# Patient Record
Sex: Female | Born: 1950 | Race: White | Hispanic: No | Marital: Married | Smoking: Former smoker
Health system: Southern US, Community
[De-identification: ages and names within clinical notes are randomized; demographics above are authoritative.]

## PROBLEM LIST (undated history)

## (undated) DIAGNOSIS — K529 Noninfective gastroenteritis and colitis, unspecified: Secondary | ICD-10-CM

## (undated) DIAGNOSIS — I499 Cardiac arrhythmia, unspecified: Secondary | ICD-10-CM

## (undated) DIAGNOSIS — N3281 Overactive bladder: Secondary | ICD-10-CM

## (undated) DIAGNOSIS — K219 Gastro-esophageal reflux disease without esophagitis: Secondary | ICD-10-CM

## (undated) DIAGNOSIS — Z85828 Personal history of other malignant neoplasm of skin: Secondary | ICD-10-CM

## (undated) DIAGNOSIS — Z973 Presence of spectacles and contact lenses: Secondary | ICD-10-CM

## (undated) DIAGNOSIS — C801 Malignant (primary) neoplasm, unspecified: Secondary | ICD-10-CM

## (undated) DIAGNOSIS — K589 Irritable bowel syndrome without diarrhea: Secondary | ICD-10-CM

## (undated) DIAGNOSIS — Z972 Presence of dental prosthetic device (complete) (partial): Secondary | ICD-10-CM

## (undated) DIAGNOSIS — M1990 Unspecified osteoarthritis, unspecified site: Secondary | ICD-10-CM

## (undated) DIAGNOSIS — E785 Hyperlipidemia, unspecified: Secondary | ICD-10-CM

## (undated) DIAGNOSIS — K642 Third degree hemorrhoids: Secondary | ICD-10-CM

## (undated) DIAGNOSIS — K449 Diaphragmatic hernia without obstruction or gangrene: Secondary | ICD-10-CM

## (undated) DIAGNOSIS — Z87898 Personal history of other specified conditions: Secondary | ICD-10-CM

## (undated) HISTORY — PX: MOHS SURGERY: SUR867

## (undated) HISTORY — PX: BUNIONECTOMY WITH HAMMERTOE RECONSTRUCTION: SHX5600

## (undated) HISTORY — DX: Hyperlipidemia, unspecified: E78.5

## (undated) HISTORY — PX: TONSILLECTOMY: SUR1361

## (undated) HISTORY — PX: FOOT SURGERY: SHX648

## (undated) HISTORY — DX: Diaphragmatic hernia without obstruction or gangrene: K44.9

## (undated) HISTORY — PX: CATARACT EXTRACTION, BILATERAL: SHX1313

## (undated) HISTORY — PX: TOTAL KNEE ARTHROPLASTY: SHX125

## (undated) HISTORY — PX: EYE SURGERY: SHX253

## (undated) HISTORY — PX: CARDIOVASCULAR STRESS TEST: SHX262

---

## 1983-01-29 HISTORY — PX: BREAST ENHANCEMENT SURGERY: SHX7

## 1994-01-28 HISTORY — PX: ABDOMINAL HYSTERECTOMY: SHX81

## 1999-05-17 ENCOUNTER — Encounter: Admission: RE | Admit: 1999-05-17 | Discharge: 1999-05-17 | Payer: Self-pay | Admitting: Family Medicine

## 1999-05-17 ENCOUNTER — Encounter: Payer: Self-pay | Admitting: Family Medicine

## 2000-12-12 ENCOUNTER — Encounter: Payer: Self-pay | Admitting: Family Medicine

## 2000-12-12 ENCOUNTER — Encounter: Admission: RE | Admit: 2000-12-12 | Discharge: 2000-12-12 | Payer: Self-pay | Admitting: Family Medicine

## 2001-11-13 ENCOUNTER — Ambulatory Visit (HOSPITAL_COMMUNITY): Admission: RE | Admit: 2001-11-13 | Discharge: 2001-11-13 | Payer: Self-pay | Admitting: Gastroenterology

## 2001-11-13 ENCOUNTER — Encounter: Payer: Self-pay | Admitting: Gastroenterology

## 2009-08-07 ENCOUNTER — Encounter: Admission: RE | Admit: 2009-08-07 | Discharge: 2009-08-07 | Payer: Self-pay | Admitting: Foot & Ankle Surgery

## 2009-12-28 HISTORY — PX: KNEE ARTHROSCOPY: SUR90

## 2010-02-18 ENCOUNTER — Encounter: Payer: Self-pay | Admitting: Foot & Ankle Surgery

## 2010-11-19 ENCOUNTER — Encounter (HOSPITAL_COMMUNITY)
Admission: RE | Admit: 2010-11-19 | Discharge: 2010-11-19 | Disposition: A | Discharge status: Home / Self Care | Payer: BC Managed Care – PPO | Source: Ambulatory Visit | Attending: Orthopedic Surgery | Admitting: Orthopedic Surgery

## 2010-11-19 ENCOUNTER — Other Ambulatory Visit (HOSPITAL_COMMUNITY): Payer: Self-pay | Admitting: Orthopedic Surgery

## 2010-11-19 ENCOUNTER — Ambulatory Visit (HOSPITAL_COMMUNITY)
Admission: RE | Admit: 2010-11-19 | Discharge: 2010-11-19 | Disposition: A | Discharge status: Home / Self Care | Payer: BC Managed Care – PPO | Source: Ambulatory Visit | Attending: Orthopedic Surgery | Admitting: Orthopedic Surgery

## 2010-11-19 DIAGNOSIS — Z01811 Encounter for preprocedural respiratory examination: Secondary | ICD-10-CM

## 2010-11-19 DIAGNOSIS — Z96659 Presence of unspecified artificial knee joint: Secondary | ICD-10-CM | POA: Insufficient documentation

## 2010-11-19 DIAGNOSIS — Z87891 Personal history of nicotine dependence: Secondary | ICD-10-CM | POA: Insufficient documentation

## 2010-11-19 DIAGNOSIS — Z01812 Encounter for preprocedural laboratory examination: Secondary | ICD-10-CM | POA: Insufficient documentation

## 2010-11-19 DIAGNOSIS — Z01818 Encounter for other preprocedural examination: Secondary | ICD-10-CM | POA: Insufficient documentation

## 2010-11-19 DIAGNOSIS — E669 Obesity, unspecified: Secondary | ICD-10-CM | POA: Insufficient documentation

## 2010-11-19 LAB — URINALYSIS, ROUTINE W REFLEX MICROSCOPIC
Leukocytes, UA: NEGATIVE
Nitrite: NEGATIVE
Specific Gravity, Urine: 1.021 (ref 1.005–1.030)
pH: 6.5 (ref 5.0–8.0)

## 2010-11-19 LAB — BASIC METABOLIC PANEL
Chloride: 102 mEq/L (ref 96–112)
GFR calc Af Amer: >90 mL/min (ref >90)
Potassium: 4.1 mEq/L (ref 3.5–5.1)
Sodium: 141 mEq/L (ref 135–145)

## 2010-11-19 LAB — CBC
HCT: 40.4 % (ref 36.0–46.0)
Hemoglobin: 13.9 g/dL (ref 12.0–15.0)
MCHC: 34.4 g/dL (ref 30.0–36.0)
WBC: 9.7 10*3/uL (ref 4.0–10.5)

## 2010-11-19 LAB — SURGICAL PCR SCREEN: MRSA, PCR: NEGATIVE

## 2010-11-19 LAB — DIFFERENTIAL
Basophils Absolute: 0 10*3/uL (ref 0.0–0.1)
Basophils Relative: 0 % (ref 0–1)
Eosinophils Absolute: 0.3 10*3/uL (ref 0.0–0.7)
Eosinophils Relative: 3 % (ref 0–5)
Lymphocytes Relative: 30 % (ref 12–46)
Lymphs Abs: 2.9 10*3/uL (ref 0.7–4.0)
Monocytes Absolute: 0.6 10*3/uL (ref 0.1–1.0)
Monocytes Relative: 6 % (ref 3–12)
Neutro Abs: 5.9 10*3/uL (ref 1.7–7.7)
Neutrophils Relative %: 61 % (ref 43–77)

## 2010-11-19 LAB — PROTIME-INR: INR: 0.99 (ref 0.00–1.49)

## 2010-11-20 LAB — URINE CULTURE

## 2010-11-27 ENCOUNTER — Inpatient Hospital Stay (HOSPITAL_COMMUNITY)
Admission: RE | Admit: 2010-11-27 | Discharge: 2010-11-30 | DRG: 209 | Disposition: A | Discharge status: Home Health | Payer: BC Managed Care – PPO | Source: Ambulatory Visit | Attending: Orthopedic Surgery | Admitting: Orthopedic Surgery

## 2010-11-27 ENCOUNTER — Inpatient Hospital Stay (HOSPITAL_COMMUNITY): Payer: BC Managed Care – PPO

## 2010-11-27 DIAGNOSIS — M1710 Unilateral primary osteoarthritis, unspecified knee: Principal | ICD-10-CM | POA: Diagnosis present

## 2010-11-27 DIAGNOSIS — K219 Gastro-esophageal reflux disease without esophagitis: Secondary | ICD-10-CM | POA: Diagnosis present

## 2010-11-27 LAB — TYPE AND SCREEN

## 2010-11-27 LAB — ABO/RH: ABO/RH(D): A POS

## 2010-11-28 LAB — BASIC METABOLIC PANEL
Chloride: 101 mEq/L (ref 96–112)
Creatinine, Ser: 0.62 mg/dL (ref 0.50–1.10)
GFR calc Af Amer: >90 mL/min (ref >90)
GFR calc non Af Amer: >90 mL/min (ref >90)

## 2010-11-28 LAB — CBC
Hemoglobin: 10.6 g/dL — ABNORMAL LOW (ref 12.0–15.0)
MCHC: 32.4 g/dL (ref 30.0–36.0)
Platelets: 274 10*3/uL (ref 150–400)
RBC: 3.61 MIL/uL — ABNORMAL LOW (ref 3.87–5.11)

## 2010-11-28 LAB — PROTIME-INR
INR: 1.15 (ref 0.00–1.49)
Prothrombin Time: 14.9 seconds (ref 11.6–15.2)

## 2010-11-29 LAB — BASIC METABOLIC PANEL
CO2: 31 mEq/L (ref 19–32)
Calcium: 9 mg/dL (ref 8.4–10.5)
GFR calc Af Amer: >90 mL/min (ref >90)
GFR calc non Af Amer: >90 mL/min (ref >90)
Sodium: 138 mEq/L (ref 135–145)

## 2010-11-29 LAB — PROTIME-INR
INR: 1.67 — ABNORMAL HIGH (ref 0.00–1.49)
Prothrombin Time: 20 seconds — ABNORMAL HIGH (ref 11.6–15.2)

## 2010-11-29 LAB — CBC
HCT: 32.7 % — ABNORMAL LOW (ref 36.0–46.0)
Platelets: 237 10*3/uL (ref 150–400)
RDW: 13 % (ref 11.5–15.5)
WBC: 7.9 10*3/uL (ref 4.0–10.5)

## 2010-11-30 LAB — PROTIME-INR
INR: 1.84 — ABNORMAL HIGH (ref 0.00–1.49)
Prothrombin Time: 21.6 seconds — ABNORMAL HIGH (ref 11.6–15.2)

## 2010-11-30 LAB — CBC
Platelets: 258 10*3/uL (ref 150–400)
RBC: 3.69 MIL/uL — ABNORMAL LOW (ref 3.87–5.11)
RDW: 12.9 % (ref 11.5–15.5)
WBC: 7.5 10*3/uL (ref 4.0–10.5)

## 2011-01-09 ENCOUNTER — Other Ambulatory Visit (HOSPITAL_COMMUNITY): Payer: Self-pay | Admitting: Orthopedic Surgery

## 2011-01-10 ENCOUNTER — Encounter (HOSPITAL_COMMUNITY): Payer: Self-pay

## 2011-01-11 ENCOUNTER — Encounter (HOSPITAL_COMMUNITY): Payer: Self-pay

## 2011-01-11 ENCOUNTER — Encounter (HOSPITAL_COMMUNITY)
Admission: RE | Admit: 2011-01-11 | Discharge: 2011-01-11 | Disposition: A | Discharge status: Home / Self Care | Payer: BC Managed Care – PPO | Source: Ambulatory Visit | Attending: Orthopedic Surgery | Admitting: Orthopedic Surgery

## 2011-01-11 HISTORY — DX: Gastro-esophageal reflux disease without esophagitis: K21.9

## 2011-01-11 HISTORY — DX: Malignant (primary) neoplasm, unspecified: C80.1

## 2011-01-11 LAB — BASIC METABOLIC PANEL
CO2: 27 mEq/L (ref 19–32)
Chloride: 102 mEq/L (ref 96–112)
GFR calc Af Amer: >90 mL/min (ref >90)
Potassium: 4.4 mEq/L (ref 3.5–5.1)
Sodium: 140 mEq/L (ref 135–145)

## 2011-01-11 LAB — CBC
HCT: 39.7 % (ref 36.0–46.0)
Hemoglobin: 13.1 g/dL (ref 12.0–15.0)
MCV: 85.2 fL (ref 78.0–100.0)
RBC: 4.66 MIL/uL (ref 3.87–5.11)
RDW: 12.4 % (ref 11.5–15.5)
WBC: 6.6 10*3/uL (ref 4.0–10.5)

## 2011-01-11 LAB — URINALYSIS, ROUTINE W REFLEX MICROSCOPIC
Bilirubin Urine: NEGATIVE
Leukocytes, UA: NEGATIVE
Nitrite: NEGATIVE
Specific Gravity, Urine: 1.023 (ref 1.005–1.030)
Urobilinogen, UA: 0.2 mg/dL (ref 0.0–1.0)

## 2011-01-11 LAB — TYPE AND SCREEN: Antibody Screen: NEGATIVE

## 2011-01-11 LAB — SURGICAL PCR SCREEN: MRSA, PCR: NEGATIVE

## 2011-01-11 MED ORDER — CHLORHEXIDINE GLUCONATE 4 % EX LIQD: 60.0000 mL | Freq: Once | CUTANEOUS | Status: DC

## 2011-01-11 NOTE — Pre-Procedure Instructions (Signed)
[place]  01/11/2011   Your procedure is scheduled on:  01-15-11 Tuesday  Report to [facility] at 11:45 AM.  Call this number if you have problems the morning of surgery: [phone]   Remember:   Do not eat food:After Midnight.  May have clear liquids: up to 4 Hours before arrival.  Clear liquids include soda, tea, black coffee, apple or grape juice, broth.  Take these medicines the morning of surgery with A SIP OF WATER: Ultram if needed   Do not wear jewelry, make-up or nail polish.  Do not wear lotions, powders, or perfumes. You may wear deodorant.  Do not shave 48 hours prior to surgery.  Do not bring valuables to the hospital.  Contacts, dentures or bridgework may not be worn into surgery.  Leave suitcase in the car. After surgery it may be brought to your room.  For patients admitted to the hospital, checkout time is 11:00 AM the day of discharge.   Patients discharged the day of surgery will not be allowed to drive home.  Name and phone number of your driver: Joann [name] - husband [phone]  Special Instructions: CHG Shower Use Special Wash: 1/2 bottle night before surgery and 1/2 bottle morning of surgery.   Please read over the following fact sheets that you were given: Pain Booklet, Coughing and Deep Breathing, Blood Transfusion Information, MRSA Information and Surgical Site Infection Prevention

## 2011-01-12 LAB — URINE CULTURE

## 2011-01-13 NOTE — H&P (Signed)
NAMEAQUA, DENSLOW                ACCOUNT NO.:  [id number]  MEDICAL RECORD NO.:  [id number]  LOCATION:  SDS                          FACILITY:  [facility]  PHYSICIAN:  Anderson Malta, M.D.    DATE OF BIRTH:  May 06, 1950  DATE OF ADMISSION:  01/11/2011 DATE OF DISCHARGE:  01/11/2011                             HISTORY & PHYSICAL   CHIEF COMPLAINT:  Right knee pain.  HISTORY OF PRESENT ILLNESS:  Joann [name] is a 60 year old female who is about 7-1/2 weeks out from left total knee replacement.  She has done well with that left total knee and found to have currently no pain in the left knee.  She describes pain in the right knee, pain with activities of daily living, ambulation and rest pain and night pain. This pain keeps her from doing what she wants to do.  She has failed a course of nonoperative therapy for the right knee.  PAST MEDICAL HISTORY:  Notable for: 1. Gastroesophageal reflux disease. 2. Skin cancer on the nose. 3. 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  SOCIAL HISTORY:  She is a former smoker.  Occasional alcohol use.  REVIEW OF SYSTEMS:  All systems reviewed and are negative as they relate to the right knee.  CURRENT MEDICATIONS:  Include Tylenol, calcium citrate, Robaxin, multivitamins, tramadol and Coumadin, which was stopped as of January 04, 2011.  PHYSICAL EXAMINATION:  GENERAL:  She is well developed, well nourished, in no acute distress, alert and oriented.  Normal body mass index. EXTREMITIES:  Pretty normal gait and alignment, but does have slight varus alignment in right lower extremity.  Palpable pedal pulses.  No pitting edema in the right lower extremity.  Range of motion is about 5- 110 degrees.  Collateral and cruciate ligaments are stable.  Trace effusion is present.  No groin pain with internal or external rotation of the leg.  No other masses, lymphadenopathy, or skin changes noted in the right knee region.  Radiographs do show end-stage  arthritis, all 3 compartments, worse on the medial side.  IMPRESSION:  Right knee arthritis refractory to nonoperative management with pain with activities of daily living.  The patient desires total knee replacement on the right.  She has had a good result with the knee replacement on the left.  Risks and benefits are discussed to include but not limited to infection, nerve or vessel damage, knee stiffness.  They are essentially the same once it applied to her left knee 7 weeks ago.  The patient understands the risks and benefits and does wish to proceed with total knee replacement.  All questions were answered.     Anderson Malta, M.D.     GSD/MEDQ  D:  01/13/2011  T:  01/13/2011  Job:  [id number]

## 2011-01-13 NOTE — H&P (Signed)
  [id number] 

## 2011-01-14 MED ORDER — CEFAZOLIN SODIUM-DEXTROSE 2-3 GM-% IV SOLR
2.0000 g | INTRAVENOUS | Status: AC
  Administered 2011-01-15: 2 g via INTRAVENOUS
  Filled 2011-01-14: qty 50

## 2011-01-14 MED ORDER — CHLORHEXIDINE GLUCONATE 4 % EX LIQD: 60.0000 mL | Freq: Once | CUTANEOUS | Status: DC

## 2011-01-15 ENCOUNTER — Ambulatory Visit (HOSPITAL_COMMUNITY): Payer: BC Managed Care – PPO | Admitting: Certified Registered"

## 2011-01-15 ENCOUNTER — Encounter (HOSPITAL_COMMUNITY): Payer: Self-pay | Admitting: Certified Registered"

## 2011-01-15 ENCOUNTER — Encounter (HOSPITAL_COMMUNITY): Payer: Self-pay | Admitting: *Deleted

## 2011-01-15 ENCOUNTER — Encounter (HOSPITAL_COMMUNITY)
Admission: AD | Disposition: A | Discharge status: Home / Self Care | Payer: Self-pay | Source: Ambulatory Visit | Attending: Orthopedic Surgery

## 2011-01-15 ENCOUNTER — Inpatient Hospital Stay (HOSPITAL_COMMUNITY)
Admission: AD | Admit: 2011-01-15 | Discharge: 2011-01-18 | DRG: 209 | Disposition: A | Discharge status: Home / Self Care | Payer: BC Managed Care – PPO | Source: Ambulatory Visit | Attending: Orthopedic Surgery | Admitting: Orthopedic Surgery

## 2011-01-15 ENCOUNTER — Ambulatory Visit (HOSPITAL_COMMUNITY): Payer: BC Managed Care – PPO

## 2011-01-15 DIAGNOSIS — M1712 Unilateral primary osteoarthritis, left knee: Secondary | ICD-10-CM

## 2011-01-15 DIAGNOSIS — K219 Gastro-esophageal reflux disease without esophagitis: Secondary | ICD-10-CM | POA: Diagnosis present

## 2011-01-15 DIAGNOSIS — M479 Spondylosis, unspecified: Secondary | ICD-10-CM | POA: Diagnosis present

## 2011-01-15 DIAGNOSIS — M1710 Unilateral primary osteoarthritis, unspecified knee: Principal | ICD-10-CM | POA: Diagnosis present

## 2011-01-15 DIAGNOSIS — Z85828 Personal history of other malignant neoplasm of skin: Secondary | ICD-10-CM

## 2011-01-15 HISTORY — PX: KNEE ARTHROPLASTY: SHX992

## 2011-01-15 LAB — APTT: aPTT: 28 seconds (ref 24–37)

## 2011-01-15 LAB — PROTIME-INR
INR: 1.13 (ref 0.00–1.49)
Prothrombin Time: 14.7 seconds (ref 11.6–15.2)

## 2011-01-15 SURGERY — ARTHROPLASTY, KNEE, TOTAL, USING IMAGELESS COMPUTER-ASSISTED NAVIGATION
Anesthesia: General | Site: Knee | Laterality: Right | Wound class: Clean

## 2011-01-15 MED ORDER — MORPHINE SULFATE 10 MG/ML IJ SOLN
INTRAMUSCULAR | Status: DC | PRN
  Administered 2011-01-15 (×2): 5 mg via INTRAVENOUS

## 2011-01-15 MED ORDER — NALOXONE HCL 0.4 MG/ML IJ SOLN
0.4000 mg | INTRAMUSCULAR | Status: DC | PRN
  Filled 2011-01-15: qty 1

## 2011-01-15 MED ORDER — KCL IN DEXTROSE-NACL 20-5-0.45 MEQ/L-%-% IV SOLN
INTRAVENOUS | Status: DC
  Administered 2011-01-16 – 2011-01-17 (×3): via INTRAVENOUS
  Filled 2011-01-15 (×7): qty 1000

## 2011-01-15 MED ORDER — ACETAMINOPHEN 325 MG PO TABS
650.0000 mg | ORAL_TABLET | Freq: Four times a day (QID) | ORAL | Status: DC | PRN
  Administered 2011-01-16 – 2011-01-17 (×2): 650 mg via ORAL
  Filled 2011-01-15 (×2): qty 2

## 2011-01-15 MED ORDER — SODIUM CHLORIDE 0.9 % IJ SOLN: 9.0000 mL | INTRAMUSCULAR | Status: DC | PRN

## 2011-01-15 MED ORDER — NEOSTIGMINE METHYLSULFATE 1 MG/ML IJ SOLN
INTRAMUSCULAR | Status: DC | PRN
  Administered 2011-01-15: 3.5 mg via INTRAVENOUS

## 2011-01-15 MED ORDER — BUPIVACAINE HCL (PF) 0.25 % IJ SOLN
INTRAMUSCULAR | Status: DC | PRN
  Administered 2011-01-15: 20 mL via INTRA_ARTICULAR

## 2011-01-15 MED ORDER — ONDANSETRON HCL 4 MG/2ML IJ SOLN
4.0000 mg | Freq: Four times a day (QID) | INTRAMUSCULAR | Status: DC | PRN
  Administered 2011-01-16 – 2011-01-18 (×3): 4 mg via INTRAVENOUS
  Filled 2011-01-15 (×3): qty 2

## 2011-01-15 MED ORDER — DIPHENHYDRAMINE HCL 12.5 MG/5ML PO ELIX
12.5000 mg | ORAL_SOLUTION | Freq: Four times a day (QID) | ORAL | Status: DC | PRN
  Filled 2011-01-15: qty 5

## 2011-01-15 MED ORDER — FLEET ENEMA 7-19 GM/118ML RE ENEM: 1.0000 | ENEMA | Freq: Once | RECTAL | Status: AC | PRN

## 2011-01-15 MED ORDER — HYDROMORPHONE HCL PF 1 MG/ML IJ SOLN
0.5000 mg | INTRAMUSCULAR | Status: DC | PRN
  Administered 2011-01-15: 0.5 mg via INTRAVENOUS

## 2011-01-15 MED ORDER — PHENOL 1.4 % MT LIQD
1.0000 | OROMUCOSAL | Status: DC | PRN
  Filled 2011-01-15: qty 177

## 2011-01-15 MED ORDER — ROCURONIUM BROMIDE 100 MG/10ML IV SOLN
INTRAVENOUS | Status: DC | PRN
  Administered 2011-01-15: 50 mg via INTRAVENOUS

## 2011-01-15 MED ORDER — ONDANSETRON HCL 4 MG/2ML IJ SOLN
4.0000 mg | Freq: Four times a day (QID) | INTRAMUSCULAR | Status: DC | PRN
  Filled 2011-01-15: qty 2

## 2011-01-15 MED ORDER — HYDROMORPHONE HCL PF 1 MG/ML IJ SOLN
0.2500 mg | INTRAMUSCULAR | Status: DC | PRN
  Administered 2011-01-15 (×4): 0.5 mg via INTRAVENOUS

## 2011-01-15 MED ORDER — METOCLOPRAMIDE HCL 5 MG PO TABS
5.0000 mg | ORAL_TABLET | Freq: Three times a day (TID) | ORAL | Status: DC | PRN
  Filled 2011-01-15: qty 2

## 2011-01-15 MED ORDER — PROPOFOL 10 MG/ML IV EMUL
INTRAVENOUS | Status: DC | PRN
  Administered 2011-01-15: 200 mg via INTRAVENOUS
  Administered 2011-01-15: 50 mg via INTRAVENOUS

## 2011-01-15 MED ORDER — FENTANYL CITRATE 0.05 MG/ML IJ SOLN
INTRAMUSCULAR | Status: DC | PRN
  Administered 2011-01-15: 100 ug via INTRAVENOUS
  Administered 2011-01-15: 50 ug via INTRAVENOUS
  Administered 2011-01-15: 250 ug via INTRAVENOUS
  Administered 2011-01-15: 100 ug via INTRAVENOUS

## 2011-01-15 MED ORDER — CEFAZOLIN SODIUM 1-5 GM-% IV SOLN
1.0000 g | Freq: Four times a day (QID) | INTRAVENOUS | Status: AC
  Administered 2011-01-16 (×3): 1 g via INTRAVENOUS
  Filled 2011-01-15 (×5): qty 50

## 2011-01-15 MED ORDER — SENNOSIDES-DOCUSATE SODIUM 8.6-50 MG PO TABS: 1.0000 | ORAL_TABLET | Freq: Every evening | ORAL | Status: DC | PRN

## 2011-01-15 MED ORDER — ONDANSETRON HCL 4 MG PO TABS: 4.0000 mg | ORAL_TABLET | Freq: Four times a day (QID) | ORAL | Status: DC | PRN

## 2011-01-15 MED ORDER — GLYCOPYRROLATE 0.2 MG/ML IJ SOLN
INTRAMUSCULAR | Status: DC | PRN
  Administered 2011-01-15: .5 mg via INTRAVENOUS

## 2011-01-15 MED ORDER — THERA M PLUS PO TABS
1.0000 | ORAL_TABLET | Freq: Every day | ORAL | Status: DC
  Administered 2011-01-15 – 2011-01-18 (×4): 1 via ORAL
  Filled 2011-01-15 (×4): qty 1

## 2011-01-15 MED ORDER — MIDAZOLAM HCL 5 MG/5ML IJ SOLN
INTRAMUSCULAR | Status: DC | PRN
  Administered 2011-01-15: 2 mg via INTRAVENOUS

## 2011-01-15 MED ORDER — ACETAMINOPHEN 10 MG/ML IV SOLN
INTRAVENOUS | Status: AC
  Filled 2011-01-15: qty 100

## 2011-01-15 MED ORDER — METHOCARBAMOL 100 MG/ML IJ SOLN
500.0000 mg | INTRAVENOUS | Status: AC
  Administered 2011-01-15: 500 mg via INTRAVENOUS
  Filled 2011-01-15: qty 5

## 2011-01-15 MED ORDER — MORPHINE SULFATE (PF) 1 MG/ML IV SOLN
INTRAVENOUS | Status: DC
  Administered 2011-01-15: 6 mg via INTRAVENOUS
  Administered 2011-01-15: 20:00:00 via INTRAVENOUS
  Administered 2011-01-15: 6 mg via INTRAVENOUS
  Administered 2011-01-16 (×2): via INTRAVENOUS
  Administered 2011-01-16: 27.88 mg via INTRAVENOUS
  Administered 2011-01-16: 13.5 mg via INTRAVENOUS
  Administered 2011-01-16 – 2011-01-17 (×2): via INTRAVENOUS
  Administered 2011-01-17: 21 mg via INTRAVENOUS
  Filled 2011-01-15 (×5): qty 25

## 2011-01-15 MED ORDER — BISACODYL 10 MG RE SUPP: 10.0000 mg | Freq: Every day | RECTAL | Status: DC | PRN

## 2011-01-15 MED ORDER — WARFARIN SODIUM 6 MG PO TABS
6.0000 mg | ORAL_TABLET | ORAL | Status: DC
  Filled 2011-01-15: qty 1

## 2011-01-15 MED ORDER — MEPERIDINE HCL 25 MG/ML IJ SOLN: 6.2500 mg | INTRAMUSCULAR | Status: DC | PRN

## 2011-01-15 MED ORDER — 0.9 % SODIUM CHLORIDE (POUR BTL) OPTIME
TOPICAL | Status: DC | PRN
  Administered 2011-01-15: 1000 mL

## 2011-01-15 MED ORDER — WARFARIN SODIUM 6 MG PO TABS
6.0000 mg | ORAL_TABLET | Freq: Once | ORAL | Status: AC
  Administered 2011-01-15: 6 mg via ORAL
  Filled 2011-01-15: qty 1

## 2011-01-15 MED ORDER — OXYCODONE-ACETAMINOPHEN 5-325 MG PO TABS
1.0000 | ORAL_TABLET | ORAL | Status: DC | PRN
  Administered 2011-01-17 (×2): 2 via ORAL
  Administered 2011-01-17: 1 via ORAL
  Administered 2011-01-17 – 2011-01-18 (×2): 2 via ORAL
  Administered 2011-01-18: 1 via ORAL
  Filled 2011-01-15 (×5): qty 2

## 2011-01-15 MED ORDER — MORPHINE SULFATE 4 MG/ML IJ SOLN
INTRAMUSCULAR | Status: DC | PRN
  Administered 2011-01-15: 8 mg via INTRAVENOUS

## 2011-01-15 MED ORDER — MENTHOL 3 MG MT LOZG: 1.0000 | LOZENGE | OROMUCOSAL | Status: DC | PRN

## 2011-01-15 MED ORDER — DIPHENHYDRAMINE HCL 50 MG/ML IJ SOLN
12.5000 mg | Freq: Four times a day (QID) | INTRAMUSCULAR | Status: DC | PRN
  Filled 2011-01-15: qty 0.25

## 2011-01-15 MED ORDER — SODIUM CHLORIDE 0.9 % IR SOLN
Status: DC | PRN
  Administered 2011-01-15: 3000 mL

## 2011-01-15 MED ORDER — ACETAMINOPHEN 650 MG RE SUPP: 650.0000 mg | Freq: Four times a day (QID) | RECTAL | Status: DC | PRN

## 2011-01-15 MED ORDER — PROMETHAZINE HCL 25 MG/ML IJ SOLN: 6.2500 mg | INTRAMUSCULAR | Status: DC | PRN

## 2011-01-15 MED ORDER — METHOCARBAMOL 100 MG/ML IJ SOLN
500.0000 mg | Freq: Four times a day (QID) | INTRAVENOUS | Status: DC | PRN
  Administered 2011-01-15: 500 mg via INTRAVENOUS
  Filled 2011-01-15: qty 5

## 2011-01-15 MED ORDER — DIPHENHYDRAMINE HCL 12.5 MG/5ML PO ELIX
12.5000 mg | ORAL_SOLUTION | ORAL | Status: DC | PRN
  Filled 2011-01-15: qty 10

## 2011-01-15 MED ORDER — HYDROCODONE-ACETAMINOPHEN 5-325 MG PO TABS
1.0000 | ORAL_TABLET | ORAL | Status: DC | PRN
  Administered 2011-01-16: 2 via ORAL
  Filled 2011-01-15: qty 2

## 2011-01-15 MED ORDER — ZOLPIDEM TARTRATE 5 MG PO TABS: 5.0000 mg | ORAL_TABLET | Freq: Every evening | ORAL | Status: DC | PRN

## 2011-01-15 MED ORDER — OXYCODONE-ACETAMINOPHEN 5-325 MG PO TABS
2.0000 | ORAL_TABLET | ORAL | Status: DC | PRN
  Filled 2011-01-15: qty 2

## 2011-01-15 MED ORDER — METOCLOPRAMIDE HCL 5 MG/ML IJ SOLN
5.0000 mg | Freq: Three times a day (TID) | INTRAMUSCULAR | Status: DC | PRN
  Filled 2011-01-15: qty 2

## 2011-01-15 MED ORDER — CLONIDINE HCL (ANALGESIA) 100 MCG/ML EP SOLN: EPIDURAL | Status: DC | PRN

## 2011-01-15 MED ORDER — LACTATED RINGERS IV SOLN
INTRAVENOUS | Status: DC | PRN
  Administered 2011-01-15: 16:00:00 via INTRAVENOUS

## 2011-01-15 MED ORDER — ONDANSETRON HCL 4 MG/2ML IJ SOLN
INTRAMUSCULAR | Status: DC | PRN
  Administered 2011-01-15: 4 mg via INTRAVENOUS

## 2011-01-15 MED ORDER — METHOCARBAMOL 500 MG PO TABS
500.0000 mg | ORAL_TABLET | Freq: Four times a day (QID) | ORAL | Status: DC | PRN
  Administered 2011-01-16 – 2011-01-18 (×6): 500 mg via ORAL
  Filled 2011-01-15 (×6): qty 1

## 2011-01-15 MED ORDER — LACTATED RINGERS IV SOLN
INTRAVENOUS | Status: DC
  Administered 2011-01-15: 16:00:00 via INTRAVENOUS

## 2011-01-15 MED ORDER — DOCUSATE SODIUM 100 MG PO CAPS
100.0000 mg | ORAL_CAPSULE | Freq: Two times a day (BID) | ORAL | Status: DC
  Administered 2011-01-16 – 2011-01-18 (×6): 100 mg via ORAL
  Filled 2011-01-15 (×8): qty 1

## 2011-01-15 MED ORDER — ACETAMINOPHEN 10 MG/ML IV SOLN
INTRAVENOUS | Status: DC | PRN
  Administered 2011-01-15: 1000 mg via INTRAVENOUS

## 2011-01-15 SURGICAL SUPPLY — 78 items
BANDAGE ELASTIC 4 VELCRO ST LF (GAUZE/BANDAGES/DRESSINGS) ×2 IMPLANT
BANDAGE ELASTIC 6 VELCRO ST LF (GAUZE/BANDAGES/DRESSINGS) ×2 IMPLANT
BANDAGE ESMARK 6X9 LF (GAUZE/BANDAGES/DRESSINGS) ×1 IMPLANT
BLADE SAG 18X100X1.27 (BLADE) ×2 IMPLANT
BLADE SAW SGTL 13.0X1.19X90.0M (BLADE) ×2 IMPLANT
BNDG COHESIVE 6X5 TAN STRL LF (GAUZE/BANDAGES/DRESSINGS) ×2 IMPLANT
BNDG ELASTIC 6X10 VLCR STRL LF (GAUZE/BANDAGES/DRESSINGS) ×2 IMPLANT
BNDG ESMARK 6X9 LF (GAUZE/BANDAGES/DRESSINGS) ×2
BOWL SMART MIX CTS (DISPOSABLE) ×2 IMPLANT
CEMENT HV SMART SET (Cement) ×4 IMPLANT
CLOTH BEACON ORANGE TIMEOUT ST (SAFETY) ×2 IMPLANT
COVER BACK TABLE 24X17X13 BIG (DRAPES) IMPLANT
COVER SURGICAL LIGHT HANDLE (MISCELLANEOUS) ×2 IMPLANT
CUFF TOURNIQUET SINGLE 34IN LL (TOURNIQUET CUFF) IMPLANT
CUFF TOURNIQUET SINGLE 44IN (TOURNIQUET CUFF) IMPLANT
DRAPE INCISE IOBAN 66X45 STRL (DRAPES) IMPLANT
DRAPE ORTHO SPLIT 77X108 STRL (DRAPES) ×3
DRAPE PROXIMA HALF (DRAPES) ×2 IMPLANT
DRAPE SURG ORHT 6 SPLT 77X108 (DRAPES) ×3 IMPLANT
DRAPE U-SHAPE 47X51 STRL (DRAPES) ×2 IMPLANT
DRAPE X-RAY CASS 24X20 (DRAPES) IMPLANT
DRSG PAD ABDOMINAL 8X10 ST (GAUZE/BANDAGES/DRESSINGS) ×6 IMPLANT
DURAPREP 26ML APPLICATOR (WOUND CARE) ×2 IMPLANT
ELECT REM PT RETURN 9FT ADLT (ELECTROSURGICAL) ×2
ELECTRODE REM PT RTRN 9FT ADLT (ELECTROSURGICAL) ×1 IMPLANT
EVACUATOR 1/8 PVC DRAIN (DRAIN) ×2 IMPLANT
FACESHIELD LNG OPTICON STERILE (SAFETY) ×2 IMPLANT
FLUID NSS /IRRIG 3000 ML XXX (IV SOLUTION) ×2 IMPLANT
GAUZE XEROFORM 5X9 LF (GAUZE/BANDAGES/DRESSINGS) ×2 IMPLANT
GLOVE BIO SURGEON ST LM GN SZ9 (GLOVE) ×2 IMPLANT
GLOVE BIOGEL PI IND STRL 7.5 (GLOVE) ×1 IMPLANT
GLOVE BIOGEL PI IND STRL 8 (GLOVE) ×1 IMPLANT
GLOVE BIOGEL PI INDICATOR 7.5 (GLOVE) ×1
GLOVE BIOGEL PI INDICATOR 8 (GLOVE) ×1
GLOVE ECLIPSE 7.0 STRL STRAW (GLOVE) ×2 IMPLANT
GLOVE SURG ORTHO 8.0 STRL STRW (GLOVE) ×2 IMPLANT
GOWN PREVENTION PLUS LG XLONG (DISPOSABLE) IMPLANT
GOWN PREVENTION PLUS XLARGE (GOWN DISPOSABLE) ×2 IMPLANT
GOWN STRL NON-REIN LRG LVL3 (GOWN DISPOSABLE) ×6 IMPLANT
HANDPIECE INTERPULSE COAX TIP (DISPOSABLE) ×1
HOOD PEEL AWAY FACE SHEILD DIS (HOOD) ×8 IMPLANT
IMMOBILIZER KNEE 20 (SOFTGOODS) ×2
IMMOBILIZER KNEE 20 THIGH 36 (SOFTGOODS) ×1 IMPLANT
IMMOBILIZER KNEE 22 UNIV (SOFTGOODS) IMPLANT
IMMOBILIZER KNEE 24 THIGH 36 (MISCELLANEOUS) IMPLANT
IMMOBILIZER KNEE 24 UNIV (MISCELLANEOUS)
KIT BASIN OR (CUSTOM PROCEDURE TRAY) ×2 IMPLANT
KIT ROOM TURNOVER OR (KITS) ×2 IMPLANT
MANIFOLD NEPTUNE II (INSTRUMENTS) ×2 IMPLANT
MARKER SPHERE PSV REFLC THRD 5 (MARKER) ×6 IMPLANT
NEEDLE 18GX1X1/2 (RX/OR ONLY) (NEEDLE) ×4 IMPLANT
NEEDLE SPNL 18GX3.5 QUINCKE PK (NEEDLE) ×2 IMPLANT
NS IRRIG 1000ML POUR BTL (IV SOLUTION) ×2 IMPLANT
PACK TOTAL JOINT (CUSTOM PROCEDURE TRAY) ×2 IMPLANT
PAD ARMBOARD 7.5X6 YLW CONV (MISCELLANEOUS) ×4 IMPLANT
PAD CAST 4YDX4 CTTN HI CHSV (CAST SUPPLIES) ×1 IMPLANT
PADDING CAST COTTON 4X4 STRL (CAST SUPPLIES) ×1
PADDING CAST COTTON 6X4 STRL (CAST SUPPLIES) ×2 IMPLANT
PIN SCHANZ 4MM 130MM (PIN) ×8 IMPLANT
RUBBERBAND STERILE (MISCELLANEOUS) ×2 IMPLANT
SET HNDPC FAN SPRY TIP SCT (DISPOSABLE) ×1 IMPLANT
SPONGE GAUZE 4X4 12PLY (GAUZE/BANDAGES/DRESSINGS) ×2 IMPLANT
SPONGE GAUZE 4X4 STERILE 39 (GAUZE/BANDAGES/DRESSINGS) ×2 IMPLANT
SPONGE LAP 18X18 X RAY DECT (DISPOSABLE) IMPLANT
STAPLER VISISTAT 35W (STAPLE) ×2 IMPLANT
SUCTION FRAZIER TIP 10 FR DISP (SUCTIONS) ×2 IMPLANT
SUT ETHILON 3 0 PS 1 (SUTURE) ×4 IMPLANT
SUT VIC AB 0 CTB1 27 (SUTURE) ×6 IMPLANT
SUT VIC AB 1 CT1 27 (SUTURE) ×5
SUT VIC AB 1 CT1 27XBRD ANBCTR (SUTURE) ×5 IMPLANT
SUT VIC AB 2-0 CT1 27 (SUTURE) ×3
SUT VIC AB 2-0 CT1 TAPERPNT 27 (SUTURE) ×3 IMPLANT
SYR 30ML SLIP (SYRINGE) ×2 IMPLANT
SYR TB 1ML LUER SLIP (SYRINGE) ×2 IMPLANT
TOWEL OR 17X24 6PK STRL BLUE (TOWEL DISPOSABLE) ×2 IMPLANT
TOWEL OR 17X26 10 PK STRL BLUE (TOWEL DISPOSABLE) ×8 IMPLANT
TRAY FOLEY CATH 14FR (SET/KITS/TRAYS/PACK) ×2 IMPLANT
WATER STERILE IRR 1000ML POUR (IV SOLUTION) ×6 IMPLANT

## 2011-01-15 NOTE — Anesthesia Preprocedure Evaluation (Addendum)
Anesthesia Evaluation  Patient identified by MRN, date of birth, ID band Patient awake    Reviewed: Allergy & Precautions, H&P , NPO status , Patient's Chart, lab work & pertinent test results, reviewed documented beta blocker date and time   Airway Mallampati: II TM Distance: >3 FB Neck ROM: Full    Dental  (+) Edentulous Upper   Pulmonary neg pulmonary ROS,  clear to auscultation        Cardiovascular neg cardio ROS Regular Normal    Neuro/Psych Negative Neurological ROS  Negative Psych ROS   GI/Hepatic Neg liver ROS, GERD-  Controlled and Medicated,  Endo/Other  Negative Endocrine ROS  Renal/GU negative Renal ROS  Genitourinary negative   Musculoskeletal negative musculoskeletal ROS (+)   Abdominal   Peds negative pediatric ROS (+)  Hematology negative hematology ROS (+)   Anesthesia Other Findings   Reproductive/Obstetrics negative OB ROS                          Anesthesia Physical Anesthesia Plan  ASA: II  Anesthesia Plan: General   Post-op Pain Management:    Induction: Intravenous  Airway Management Planned: Oral ETT  Additional Equipment:   Intra-op Plan:   Post-operative Plan: Extubation in OR  Informed Consent: I have reviewed the patients History and Physical, chart, labs and discussed the procedure including the risks, benefits and alternatives for the proposed anesthesia with the patient or authorized representative who has indicated his/her understanding and acceptance.     Plan Discussed with: CRNA  Anesthesia Plan Comments:         Anesthesia Quick Evaluation

## 2011-01-15 NOTE — Brief Op Note (Signed)
01/15/2011  6:53 PM  PATIENT:  Joann [name]  60 y.o. female  PRE-OPERATIVE DIAGNOSIS:  Right knee osteoarthritis  POST-OPERATIVE DIAGNOSIS:  right knee osteoarthritis   PROCEDURE:  Procedure(s): COMPUTER ASSISTED TOTAL KNEE ARTHROPLASTY  SURGEON:  Surgeon(s): Tonna Corner Dean  ASSISTANT: Darnelle Going PA  ANESTHESIA:   general  EBL: 200 ml    Total I/O In: 2500 [I.V.:2500] Out: 400 [Urine:200; Blood:200]  BLOOD ADMINISTERED: none  DRAINS: hemovac   LOCAL MEDICATIONS USED:  none  SPECIMEN:  No Specimen  COUNTS:  YES  TOURNIQUET:  1 hour 19 min @ 300 mm  DICTATION: .Other Dictation: Dictation Number [id number]  PLAN OF CARE: Admit to inpatient   PATIENT DISPOSITION:  PACU - hemodynamically stable

## 2011-01-15 NOTE — Anesthesia Postprocedure Evaluation (Signed)
Anesthesia Post Note  Patient: Joann [name]  Procedure(s) Performed:  COMPUTER ASSISTED TOTAL KNEE ARTHROPLASTY - Right total knee arthroplasty  Anesthesia type: General  Patient location: PACU  Post pain: Pain level controlled  Post assessment: Patient's Cardiovascular Status Stable  Last Vitals:  Filed Vitals:   01/15/11 2045  BP: 130/74  Pulse: 79  Temp: 36.9 C  Resp: 12    Post vital signs: Reviewed and stable  Level of consciousness: sedated  Complications: No apparent anesthesia complications

## 2011-01-15 NOTE — Transfer of Care (Signed)
Immediate Anesthesia Transfer of Care Note  Patient: Joann [name]  Procedure(s) Performed:  COMPUTER ASSISTED TOTAL KNEE ARTHROPLASTY - Right total knee arthroplasty  Patient Location: PACU  Anesthesia Type: General  Level of Consciousness: awake, oriented, sedated and patient cooperative  Airway & Oxygen Therapy: Patient Spontanous Breathing and Patient connected to nasal cannula oxygen  Post-op Assessment: Report given to PACU RN, Post -op Vital signs reviewed and stable and Patient moving all extremities X 4  Post vital signs: Reviewed and stable  Complications: No apparent anesthesia complications

## 2011-01-15 NOTE — Progress Notes (Signed)
ANTICOAGULATION CONSULT NOTE - Initial Consult  Pharmacy Consult for Coumadin Indication: VTE prophylaxis  No Known Allergies  Patient Measurements:   Adjusted Body Weight:    Vital Signs: Temp: 98 F (36.7 C) (12/18 2120) Temp src: Oral (12/18 2120) BP: 121/71 mmHg (12/18 2120) Pulse Rate: 90  (12/18 2120)  Labs:  Basename 01/15/11 1219  HGB --  HCT --  PLT --  APTT 28  LABPROT 14.7  INR 1.13  HEPARINUNFRC --  CREATININE --  CKTOTAL --  CKMB --  TROPONINI --   The CrCl is unknown because both a height and weight (above a minimum accepted value) are required for this calculation.  Medical History: Past Medical History  Diagnosis Date  . GERD (gastroesophageal reflux disease)   . Cancer     skin cancer on nose  . Arthritis     knees and lower back    Medications:  Prescriptions prior to admission  Medication Sig Dispense Refill  . Calcium Citrate-Vitamin D (CITRACAL + D PO) Take 1 tablet by mouth daily.        . methocarbamol (ROBAXIN) 500 MG tablet Take 500 mg by mouth every 12 (twelve) hours. For back spasm.       . Multiple Vitamins-Minerals (MULTIVITAMINS THER. W/MINERALS) TABS Take 1 tablet by mouth daily.        . traMADol (ULTRAM) 50 MG tablet Take 50-100 mg by mouth every 6 (six) hours as needed. For pain. Maximum dose= 8 tablets per day       . warfarin (COUMADIN) 5 MG tablet Take 2.5 mg by mouth daily.        Marland Kitchen acetaminophen (TYLENOL) 500 MG tablet Take 500 mg by mouth every 6 (six) hours as needed. For pain         Assessment: R TKA today (s/p L TKA 7-8 weeks ago) to start Coumadin for VTE prophylaxis. Baseline INR 1.13  Goal of Therapy:  INR 2-3   Plan:  Coumadin 17m po x 1 tonight. Daily PT/INR  REilene GhaziStillinger 01/15/2011,9:28 PM

## 2011-01-15 NOTE — Interval H&P Note (Signed)
History and Physical Interval Note:  01/15/2011 3:46 PM  Joann [name]  has presented today for surgery, with the diagnosis of Right knee osteoarthritis  The various methods of treatment have been discussed with the patient and family. After consideration of risks, benefits and other options for treatment, the patient has consented to  Procedure(s): COMPUTER ASSISTED TOTAL KNEE ARTHROPLASTY as a surgical intervention .  The patients' history has been reviewed, patient examined, no change in status, stable for surgery.  I have reviewed the patients' chart and labs.  Questions were answered to the patient's satisfaction.     Lubna Stegeman SCOTT

## 2011-01-15 NOTE — Preoperative (Signed)
Beta Blockers   Reason not to administer Beta Blockers:Not Applicable 

## 2011-01-16 LAB — PROTIME-INR: Prothrombin Time: 14.7 seconds (ref 11.6–15.2)

## 2011-01-16 LAB — BASIC METABOLIC PANEL
Calcium: 9.2 mg/dL (ref 8.4–10.5)
Creatinine, Ser: 0.55 mg/dL (ref 0.50–1.10)
GFR calc non Af Amer: >90 mL/min (ref >90)
Glucose, Bld: 149 mg/dL — ABNORMAL HIGH (ref 70–99)
Sodium: 138 mEq/L (ref 135–145)

## 2011-01-16 LAB — CBC
Hemoglobin: 10.7 g/dL — ABNORMAL LOW (ref 12.0–15.0)
MCH: 28.2 pg (ref 26.0–34.0)
MCHC: 33.3 g/dL (ref 30.0–36.0)
MCV: 84.7 fL (ref 78.0–100.0)

## 2011-01-16 MED ORDER — WARFARIN VIDEO: Freq: Once | Status: DC

## 2011-01-16 MED ORDER — COUMADIN BOOK
Freq: Once | Status: AC
  Administered 2011-01-16: 11:00:00
  Filled 2011-01-16 (×2): qty 1

## 2011-01-16 MED ORDER — WARFARIN SODIUM 6 MG PO TABS
6.0000 mg | ORAL_TABLET | Freq: Once | ORAL | Status: AC
  Administered 2011-01-16: 6 mg via ORAL
  Filled 2011-01-16: qty 1

## 2011-01-16 NOTE — Progress Notes (Signed)
ANTICOAGULATION CONSULT NOTE - Follow Up Consult  Pharmacy Consult for: Coumadin Indication: VTE px s/p R TKA  No Known Allergies  Vital Signs: Temp: 98.3 F (36.8 C) (12/19 0550) Temp src: Oral (12/19 0550) BP: 113/64 mmHg (12/19 0550) Pulse Rate: 97  (12/19 0550)  Labs:  Basename 01/16/11 0612 01/15/11 1219  HGB 10.7* --  HCT 32.1* --  PLT 284 --  APTT -- 28  LABPROT 14.7 14.7  INR 1.13 1.13  HEPARINUNFRC -- --  CREATININE 0.55 --  CKTOTAL -- --  CKMB -- --  TROPONINI -- --   Assessment: 60yof on coumadin for VTE px s/p R TKA. INR of 1.13 remains below goal as expected after 1 dose of coumadin given post-op. Of note, pt on coumadin pta (2.67m daily) for L TKA ~7-8 weeks ago.  Goal of Therapy:  INR 2-3   Plan:  1) Repeat coumadin 675mx 1 2) Follow up INR in AM 3) Coumadin book and video to pt  MaDeboraha Sprang2/19/2012,10:22 AM

## 2011-01-16 NOTE — Progress Notes (Signed)
OT NOTE OT order received and appreciated.  Pt previously admitted in 10/12 for L TKA and has all education and equipment necessary for safe d/c home. Pt, pt's daughter, and PT agree with decision to not follow acutely as pt will have necessary level of assistance at home.  OT signing off.   01/16/2011 Darrol Jump OTR/L Pager [phone] Office [phone]

## 2011-01-16 NOTE — Op Note (Signed)
NAMEMarland Kitchen  Joann [name], Joann [name] NO.:  [id number]  MEDICAL RECORD NO.:  [id number]  LOCATION:  8099                         FACILITY:  [facility]  PHYSICIAN:  Anderson Malta, M.D.    DATE OF BIRTH:  [birth year]  DATE OF PROCEDURE:  01/15/2011 DATE OF DISCHARGE:                              OPERATIVE REPORT   PREOPERATIVE DIAGNOSIS:  Right knee arthritis.  POSTOPERATIVE DIAGNOSIS:  Right knee arthritis.  PROCEDURE:  Right total knee replacement.  COMPONENTS INSERTED:  DePuy rotating platform, posterior cruciate sacrificing, 2.5 femur, 2.5 tibia, 15 poly, 35 patella.  SURGEON:  Anderson Malta, M.D.  ASSISTANT:  Epimenio Foot, PA.  ANESTHESIA:  General.  ESTIMATED BLOOD LOSS:  150 mL.  DRAINS:  Hemovac x1.  INDICATIONS:  Joann [name] is a 60 year old female with right knee arthritis, end-stage refractory to nonoperative management, who presents for right total knee replacement after explanation of risks and benefits, anticipates improved range of motion, walking endurance, generally 90% success rate from my experience and literature as well as with her positive results from the left knee replacement.  PROCEDURE IN DETAIL:  The patient was brought to the operating room where general endotracheal anesthesia was induced.  Preoperative antibiotics were administered.  A time-out was called.  Right leg was prescribed with alcohol and Betadine, shattered air dry, prepped with DuraPrep solution and draped in sterile manner.  Joann [name] was used to cover the operative field.  Right leg was elevated and exsanguinated with an Esmarch wrap.  Tourniquet was inflated for a total time of 1 hour 19 minutes.  Anterior approach to the knee was made.  Skin and subcutaneous tissues were sharply divided.  Precise location of this division was marked with number 1 Vicryl suture.  Patella was everted.  Fat-pad was partially excised.  Proximal tibia was exposed.  The anterior distal femur was  exposed, 2 pins placed in the distal medial femur and proximal medial tibia.  ACL and PCL were released.  Registration points were achieved for computer guided total knee replacement including the bimalleolar axis, hip center rotation in various points around the knee. Tibia cut was then made with the collaterals and posterior neurovascular structures protected.  Perpendicular to the mechanical axis of the tibia, a tensioning device was placed.  Distal femoral cut was made and the patient was found to have full extension with a 12-degree spacer. At this time, Chamfer and box cut was made on the distal femur.  Medial and lateral meniscal remnants were removed.  The patient did have tricompartmental arthritis, worse in the medial and patellofemoral compartments.  Trial components were placed after the tibia was keel punched.  The patella was cut freehand from 22 to 13 and a trial 35 patella was placed.  The patient had excellent patellar tracking without lateral release.  The patient achieved full extension, full flexion, good collateral ligament stability at 0, 30, and 90 degrees.  At this time, trial components were removed, 3 L of irrigating solution were passed through the knee, true components were placed with the poly trial spacer and 5 degrees extension was noted.  Cement was allowed to harden, 15 poly was placed  and this gave 0 degrees of extension and neutral alignment.  After tourniquet was released, some bleeding points encountered were controlled with electrocautery.  This gave excellent range of motion, good stability to varus-valgus stress at 0, 30 and 90 degrees with good patellar tracking no-thumbs technique.  At this time, the knee was placed over a bolster closed over Hemovac drain using #1 Vicryl suture, interrupted inverted 2-0 Vicryl suture, and the skin staples.  The incisions for the pin sites were irrigated and closed using 3-0 nylon.  Solution of Marcaine and  morphine was injected into the knee.  Bulky dressing and knee immobilizer were placed.  Joann [name]'s assistance was required during the case for opening, closing, retraction of neurovascular structures.  Her assistance was a medical necessity.     Anderson Malta, M.D.     GSD/MEDQ  D:  01/15/2011  T:  01/16/2011  Job:  [id number]

## 2011-01-16 NOTE — Progress Notes (Signed)
Physical Therapy Evaluation Patient Details Name: Joann [name] MRN: [id number] DOB: 1950-09-11 Today's Date: 01/16/2011  Problem List: There is no problem list on file for this patient.   Past Medical History:  Past Medical History  Diagnosis Date  . GERD (gastroesophageal reflux disease)   . Cancer     skin cancer on nose  . Arthritis     knees and lower back   Past Surgical History:  Past Surgical History  Procedure Date  . Abdominal hysterectomy 1996  . Joint replacement 10-2010  . Arthroscopy left knee   . Right foot surgery     PT Assessment/Plan/Recommendation PT Assessment Clinical Impression Statement: 61 yo female s/p RTKA presents with decr independence and safety with mobility and impairments listed below; will benefit from PT to maximize independence and safety with mobility, transfers, amb, and reinforce need to ROM therx to prevent contracture and enable safe dc home with prn family assist PT Recommendation/Assessment: Patient will need skilled PT in the acute care venue PT Problem List: Decreased strength;Decreased range of motion;Decreased activity tolerance;Decreased mobility;Decreased knowledge of use of DME;Decreased knowledge of precautions;Pain PT Therapy Diagnosis : Difficulty walking;Abnormality of gait;Acute pain PT Plan PT Frequency: 7X/week PT Treatment/Interventions: DME instruction;Gait training;Functional mobility training;Therapeutic activities;Therapeutic exercise;Patient/family education PT Recommendation Follow Up Recommendations: Home health PT;24 hour supervision/assistance Equipment Recommended: None recommended by PT PT Goals  Acute Rehab PT Goals PT Goal Formulation: With patient Time For Goal Achievement: 7 days Pt will go Supine/Side to Sit: with supervision Pt will go Sit to Supine/Side: with supervision Pt will go Sit to Stand: with supervision Pt will go Stand to Sit: with supervision Pt will Ambulate: >150 feet;with  supervision;with rolling walker Pt will Perform Home Exercise Program: with supervision, verbal cues required/provided  PT Evaluation Precautions/Restrictions  Precautions Precautions: Knee Restrictions Weight Bearing Restrictions: Yes RLE Weight Bearing: Weight bearing as tolerated Prior Functioning  Home Living Lives With: Spouse Receives Help From: Family Type of Home: House Home Layout: One level Home Access: Level entry Prior Function Level of Independence: Independent with homemaking with ambulation;Requires assistive device for independence Cognition Cognition Arousal/Alertness: Awake/alert Overall Cognitive Status: Appears within functional limits for tasks assessed Orientation Level: Oriented X4 Sensation/Coordination Sensation Light Touch: Appears Intact Coordination Gross Motor Movements are Fluid and Coordinated: Yes Fine Motor Movements are Fluid and Coordinated: Yes Extremity Assessment RUE Assessment RUE Assessment: Within Functional Limits LUE Assessment LUE Assessment: Within Functional Limits RLE Assessment RLE Assessment: Exceptions to [facility] RLE Strength RLE Overall Strength Comments: grossly decr AROM and strength, limited by pain; good quad strength with good SLR; decr tol of knee flexion AAROM to approx 30 degrees by visual estimate LLE Assessment LLE Assessment: Exceptions to [facility] LLE Strength LLE Overall Strength Comments: decr ROM L knee, effecting transfers Mobility (including Balance) Bed Mobility Bed Mobility: Yes Supine to Sit: 3: Mod assist (pt=55%) Supine to Sit Details (indicate cue type and reason): cues for technique; physical assist for Right LE Transfers Transfers: Yes Sit to Stand: 3: Mod assist;From bed Sit to Stand Details (indicate cue type and reason): cues for safe hand placement; difficulty translating center of mass over Left foot (non-surgical side) as pt is with decr flexion range L knee Stand to Sit: 4: Min assist;With  armrests;With upper extremity assist Stand to Sit Details: cues for safety and hand placement Ambulation/Gait Ambulation/Gait: Yes Ambulation/Gait Assistance: 4: Min assist (with and without physical contact) Ambulation/Gait Assistance Details (indicate cue type and reason): cues for sequence, and to activate  right quad for stance stability Ambulation Distance (Feet): 80 Feet Assistive device: Rolling walker Gait Pattern: Step-to pattern    Exercise  Total Joint Exercises Quad Sets: AROM;Right;10 reps Heel Slides: AAROM;Left;10 reps End of Session PT - End of Session Equipment Utilized During Treatment: Gait belt;Right knee immobilizer Activity Tolerance: Patient tolerated treatment well (decr tol of knee flexion) Patient left: in chair;with call bell in reach;with family/visitor present Nurse Communication: Mobility status for transfers;Mobility status for ambulation General Behavior During Session: [facility] for tasks performed Cognition: [facility] for tasks performed  Roney Marion [facility] [place], [place]  01/16/2011, 4:00 PM

## 2011-01-16 NOTE — Progress Notes (Signed)
Subjective: Pt stable pain controlled   Objective: Vital signs in last 24 hours: Temp:  [97.8 F (36.6 C)-98.5 F (36.9 C)] 98.3 F (36.8 C) (12/19 0550) Pulse Rate:  [77-97] 97  (12/19 0550) Resp:  [11-21] 17  (12/19 0550) BP: (113-142)/(64-90) 113/64 mmHg (12/19 0550) SpO2:  [93 %-100 %] 100 % (12/19 0550)  Intake/Output from previous day: 12/18 0701 - 12/19 0700 In: 3361.3 [I.V.:3361.3] Out: 2550 [Urine:2300; Drains:50; Blood:200] Intake/Output this shift:    Exam:  Sensation intact distally Intact pulses distally Dorsiflexion/Plantar flexion intact  Labs:  Basename 01/16/11 0612  HGB 10.7*    Basename 01/16/11 0612  WBC 9.1  RBC 3.79*  HCT 32.1*  PLT 284    Basename 01/16/11 0612  NA 138  K 4.2  CL 105  CO2 25  BUN 5*  CREATININE 0.55  GLUCOSE 149*  CALCIUM 9.2    Basename 01/16/11 0612 01/15/11 1219  LABPT -- --  INR 1.13 1.13    Assessment/Plan: Pt doing well - mobilize with PT - cpm 6 hrs per day   DEAN,GREGORY SCOTT 01/16/2011, 12:07 PM

## 2011-01-17 ENCOUNTER — Encounter (HOSPITAL_COMMUNITY): Payer: Self-pay | Admitting: Orthopedic Surgery

## 2011-01-17 LAB — PROTIME-INR: INR: 2.43 — ABNORMAL HIGH (ref 0.00–1.49)

## 2011-01-17 LAB — CBC
HCT: 30.8 % — ABNORMAL LOW (ref 36.0–46.0)
MCH: 27.4 pg (ref 26.0–34.0)
MCHC: 31.8 g/dL (ref 30.0–36.0)
MCV: 86 fL (ref 78.0–100.0)
RDW: 12.6 % (ref 11.5–15.5)

## 2011-01-17 MED ORDER — OXYCODONE-ACETAMINOPHEN 5-325 MG PO TABS: 2.0000 | ORAL_TABLET | ORAL | Status: DC | PRN

## 2011-01-17 NOTE — Progress Notes (Signed)
Physical Therapy Treatment Patient Details Name: Joann [name] MRN: [id number] DOB: [birth year] Today's Date: 01/17/2011  PT Assessment/Plan  PT - Assessment/Plan Comments on Treatment Session: patient progressing well with mobilty and knee ROM.  Patient progressing steadily and should be ready for planned d/c tomorrow. PT Plan: Discharge plan remains appropriate PT Frequency: 7X/week Follow Up Recommendations: Home health PT Equipment Recommended: None recommended by PT PT Goals  Acute Rehab PT Goals PT Goal: Supine/Side to Sit - Progress: Progressing toward goal PT Goal: Sit to Stand - Progress: Progressing toward goal PT Goal: Stand to Sit - Progress: Progressing toward goal PT Goal: Ambulate - Progress: Progressing toward goal PT Goal: Perform Home Exercise Program - Progress: Progressing toward goal  PT Treatment Precautions/Restrictions  Precautions Precautions: Knee Restrictions Weight Bearing Restrictions: Yes RLE Weight Bearing: Weight bearing as tolerated Mobility (including Balance) Bed Mobility Supine to Sit: 4: Min assist;With rails;HOB flat Supine to Sit Details (indicate cue type and reason): assist for RLE Transfers Sit to Stand: 5: Supervision;With upper extremity assist;From bed Sit to Stand Details (indicate cue type and reason): verbal cues for sequencing/technique Stand to Sit: 5: Supervision;To chair/3-in-1;With upper extremity assist Stand to Sit Details: cues for safety and hand placement Ambulation/Gait Ambulation/Gait: Yes Ambulation/Gait Assistance: 5: Supervision Ambulation/Gait Assistance Details (indicate cue type and reason): supervision for safety Ambulation Distance (Feet): 120 Feet Assistive device: Rolling walker Gait Pattern: Step-to pattern    Exercise  Total Joint Exercises Ankle Circles/Pumps: AROM;Both;10 reps;Supine Quad Sets: AROM;Both;10 reps;Supine Gluteal Sets: AROM;Both;Supine;10 reps Towel Squeeze: AROM;Both;Supine;10  reps Short Arc QuadSinclair Ship;Right;10 reps;Supine Heel Slides: AAROM;Right;10 reps;Supine Hip ABduction/ADduction: AAROM;Right;10 reps;Supine Straight Leg Raises: AAROM;Right;10 reps;Supine Long Arc Quad: AAROM;Right;10 reps;Seated Knee Flexion:  (knee flex = 68^) End of Session PT - End of Session Equipment Utilized During Treatment: Gait belt;Right knee immobilizer Activity Tolerance: Patient tolerated treatment well Patient left: in chair Nurse Communication: Mobility status for transfers;Mobility status for ambulation General Behavior During Session: [facility] for tasks performed Cognition: [facility] for tasks performed  Shanna Cisco, PT 01/17/2011, 12:03 PM [phone]

## 2011-01-17 NOTE — Progress Notes (Signed)
Subjective: Pt stable, vss   Objective: Vital signs in last 24 hours: Temp:  [99 F (37.2 C)-101.5 F (38.6 C)] 100.2 F (37.9 C) (12/20 0624) Pulse Rate:  [67-109] 109  (12/20 0624) Resp:  [18-20] 18  (12/20 0624) BP: (100-117)/(57-68) 102/58 mmHg (12/20 0624) SpO2:  [92 %-99 %] 96 % (12/20 0624)  Intake/Output from previous day: 12/19 0701 - 12/20 0700 In: 750 [I.V.:750] Out: -  Intake/Output this shift:    Exam:  Incision: no drainage  Labs:  [facility] 01/17/11 0650 01/16/11 0612  HGB 9.8* 10.7*    Basename 01/17/11 0650 01/16/11 0612  WBC 6.7 9.1  RBC 3.58* 3.79*  HCT 30.8* 32.1*  PLT 252 284    Basename 01/16/11 0612  NA 138  K 4.2  CL 105  CO2 25  BUN 5*  CREATININE 0.55  GLUCOSE 149*  CALCIUM 9.2    Basename 01/17/11 0650 01/16/11 0612  LABPT -- --  INR 2.43* 1.13    Assessment/Plan: Pt progressing - possible dc am - inr therapeutic - dc pca - oral pain meds - hl iv   Joann [name] SCOTT 01/17/2011, 7:48 AM

## 2011-01-17 NOTE — Progress Notes (Signed)
ANTICOAGULATION CONSULT NOTE - Follow Up Consult  Pharmacy Consult for: Coumadin Indication: VTE px s/p R TKA  Vital Signs: Temp: 100.2 F (37.9 C) (12/20 0624) Temp src: Oral (12/20 0624) BP: 102/58 mmHg (12/20 0624) Pulse Rate: 109  (12/20 0624)  Labs:  Basename 01/17/11 0650 01/16/11 0612 01/15/11 1219  HGB 9.8* 10.7* --  HCT 30.8* 32.1* --  PLT 252 284 --  APTT -- -- 28  LABPROT 26.8* 14.7 14.7  INR 2.43* 1.13 1.13  HEPARINUNFRC -- -- --  CREATININE -- 0.55 --  CKTOTAL -- -- --  CKMB -- -- --  TROPONINI -- -- --   Assessment: 60yof continues on coumadin for VTE px s/p R TKA. INR therapeutic but with large jump 1.13-->2.43 after two 3m doses. Not surprising since patient only on 2.528mdaily pta. No bleeding noted.    Goal of Therapy:  INR 2-3   Plan:  1) No coumadin tonight 2) Follow up INR in AM  MaDeboraha Sprang2/20/2012,9:12 AM

## 2011-01-18 LAB — CBC
HCT: 30 % — ABNORMAL LOW (ref 36.0–46.0)
Hemoglobin: 9.9 g/dL — ABNORMAL LOW (ref 12.0–15.0)
MCH: 28.5 pg (ref 26.0–34.0)
MCHC: 33 g/dL (ref 30.0–36.0)
MCV: 86.5 fL (ref 78.0–100.0)
RDW: 12.7 % (ref 11.5–15.5)

## 2011-01-18 LAB — PROTIME-INR: INR: 2.39 — ABNORMAL HIGH (ref 0.00–1.49)

## 2011-01-18 MED ORDER — OXYCODONE-ACETAMINOPHEN 5-325 MG PO TABS: 2.0000 | ORAL_TABLET | ORAL | Status: AC | PRN

## 2011-01-18 MED ORDER — WARFARIN SODIUM 2.5 MG PO TABS: 2.5000 mg | ORAL_TABLET | Freq: Once | ORAL | Status: AC

## 2011-01-18 MED ORDER — WARFARIN SODIUM 2.5 MG PO TABS
2.5000 mg | ORAL_TABLET | Freq: Once | ORAL | Status: DC
  Filled 2011-01-18: qty 1

## 2011-01-18 MED ORDER — METHOCARBAMOL 500 MG PO TABS: 500.0000 mg | ORAL_TABLET | Freq: Four times a day (QID) | ORAL | Status: AC | PRN

## 2011-01-18 NOTE — Progress Notes (Signed)
Pt stable vss  Moving well dc

## 2011-01-18 NOTE — Progress Notes (Signed)
Discharge instructions reviewed.  Pt to be discharged home with SO.  NAD noted.

## 2011-01-18 NOTE — Progress Notes (Signed)
Physical Therapy Treatment Patient Details Name: Joann [name] MRN: [id number] DOB: November 15, 1950 Today's Date: 01/18/2011  PT Assessment/Plan  PT - Assessment/Plan Comments on Treatment Session: Pt continuing to progress well with therapy. PT Plan: Discharge plan remains appropriate PT Frequency: 7X/week Follow Up Recommendations: Home health PT Equipment Recommended: None recommended by PT PT Goals  Acute Rehab PT Goals PT Goal: Supine/Side to Sit - Progress: Met PT Goal: Sit to Stand - Progress: Met PT Goal: Stand to Sit - Progress: Met PT Goal: Ambulate - Progress: Met PT Goal: Perform Home Exercise Program - Progress: Not met  PT Treatment Precautions/Restrictions  Precautions Precautions: Knee Restrictions Weight Bearing Restrictions: Yes RLE Weight Bearing: Weight bearing as tolerated Mobility (including Balance) Bed Mobility Supine to Sit: 6: Modified independent (Device/Increase time) Transfers Sit to Stand: 6: Modified independent (Device/Increase time);From bed;From chair/3-in-1;With upper extremity assist Stand to Sit: 6: Modified independent (Device/Increase time);To chair/3-in-1;With upper extremity assist Ambulation/Gait Ambulation/Gait Assistance: 5: Supervision Ambulation/Gait Assistance Details (indicate cue type and reason): Cues to stay close to RW Ambulation Distance (Feet): 180 Feet Assistive device: Rolling walker Gait Pattern: Step-to pattern;Decreased stride length    Exercise    End of Session PT - End of Session Equipment Utilized During Treatment: Gait belt Activity Tolerance: Patient tolerated treatment well Patient left: with call bell in reach Nurse Communication: Mobility status for transfers;Mobility status for ambulation General Behavior During Session: [facility] for tasks performed Cognition: [facility] for tasks performed  Robinette, [name] 01/18/2011, 1:18 PM 01/18/2011 Jacqualyn Posey PTA [phone] pager [phone]  office

## 2011-01-18 NOTE — Progress Notes (Signed)
Utilization review completed. Rozanna Boer, RN, BSN. 01/18/11

## 2011-01-18 NOTE — Progress Notes (Signed)
ANTICOAGULATION CONSULT NOTE - Follow Up Consult  Pharmacy Consult for: Coumadin Indication: VTE px s/p R TKA  No Known Allergies  Vital Signs: Temp: 98.8 F (37.1 C) (12/21 0556) Temp src: Oral (12/21 0556) BP: 103/63 mmHg (12/21 0556) Pulse Rate: 92  (12/21 0556)  Labs:  Basename 01/18/11 0535 01/17/11 0650 01/16/11 0612 01/15/11 1219  HGB 9.9* 9.8* -- --  HCT 30.0* 30.8* 32.1* --  PLT 256 252 284 --  APTT -- -- -- 28  LABPROT 26.5* 26.8* 14.7 --  INR 2.39* 2.43* 1.13 --  HEPARINUNFRC -- -- -- --  CREATININE -- -- 0.55 --  CKTOTAL -- -- -- --  CKMB -- -- -- --  TROPONINI -- -- -- --   Assessment: 60yof continues on coumadin for VTE px s/p R TKA. INR of 2.39 is therapeutic. Dose held yesterday 2/2 large jump in INR. Will resume today at lower dose.   Goal of Therapy:  INR 2-3   Plan:  1) Coumadin 2.62m x 1 2) Follow up INR in AM  Joann Sprang12/21/2012,11:09 AM

## 2011-02-03 NOTE — Discharge Summary (Signed)
Physician Discharge Summary  Patient ID: Joann [name] MRN: [id number] DOB/AGE: 29-Jun-1950 61 y.o.  Admit date: 01/15/2011 Discharge date: 02/03/2011  Admission Diagnoses:  Knee arthritis Discharge Diagnoses:  Same  Surgeries: Procedure(s): COMPUTER ASSISTED TOTAL KNEE ARTHROPLASTY on 01/15/2011   Consultants:    Discharged Condition: Stable  Hospital Course: Joann [name] is an 61 y.o. female who was admitted 01/15/2011 with a chief complaint of knee arthritis, and found to have a diagnosis of knee arthritis.  They were brought to the operating room on 01/15/2011 and underwent the above named procedures.    Antibiotics given:  Anti-infectives     Start     Dose/Rate Route Frequency Ordered Stop   01/15/11 2215   ceFAZolin (ANCEF) IVPB 1 g/50 mL premix        1 g 100 mL/hr over 30 Minutes Intravenous Every 6 hours 01/15/11 2005 01/16/11 1030   01/14/11 1615   ceFAZolin (ANCEF) IVPB 2 g/50 mL premix        2 g 100 mL/hr over 30 Minutes Intravenous 60 min pre-op 01/14/11 1612 01/15/11 1610        .  Recent vital signs:  Filed Vitals:   01/18/11 1350  BP: 104/75  Pulse: 104  Temp: 98.1 F (36.7 C)  Resp: 18    Recent laboratory studies:  Results for orders placed during the hospital encounter of 01/15/11  APTT      Component Value Range   aPTT 28  24 - 37 (seconds)  PROTIME-INR      Component Value Range   Prothrombin Time 14.7  11.6 - 15.2 (seconds)   INR 1.13  0.00 - 1.49   PROTIME-INR      Component Value Range   Prothrombin Time 14.7  11.6 - 15.2 (seconds)   INR 1.13  0.00 - 1.49   CBC      Component Value Range   WBC 9.1  4.0 - 10.5 (K/uL)   RBC 3.79 (*) 3.87 - 5.11 (MIL/uL)   Hemoglobin 10.7 (*) 12.0 - 15.0 (g/dL)   HCT 32.1 (*) 36.0 - 46.0 (%)   MCV 84.7  78.0 - 100.0 (fL)   MCH 28.2  26.0 - 34.0 (pg)   MCHC 33.3  30.0 - 36.0 (g/dL)   RDW 12.5  11.5 - 15.5 (%)   Platelets 284  150 - 400 (K/uL)  BASIC METABOLIC PANEL      Component Value  Range   Sodium 138  135 - 145 (mEq/L)   Potassium 4.2  3.5 - 5.1 (mEq/L)   Chloride 105  96 - 112 (mEq/L)   CO2 25  19 - 32 (mEq/L)   Glucose, Bld 149 (*) 70 - 99 (mg/dL)   BUN 5 (*) 6 - 23 (mg/dL)   Creatinine, Ser 0.55  0.50 - 1.10 (mg/dL)   Calcium 9.2  8.4 - 10.5 (mg/dL)   GFR calc non Af Amer >90  >90 (mL/min)   GFR calc Af Amer >90  >90 (mL/min)  PROTIME-INR      Component Value Range   Prothrombin Time 26.8 (*) 11.6 - 15.2 (seconds)   INR 2.43 (*) 0.00 - 1.49   CBC      Component Value Range   WBC 6.7  4.0 - 10.5 (K/uL)   RBC 3.58 (*) 3.87 - 5.11 (MIL/uL)   Hemoglobin 9.8 (*) 12.0 - 15.0 (g/dL)   HCT 30.8 (*) 36.0 - 46.0 (%)   MCV 86.0  78.0 - 100.0 (fL)  MCH 27.4  26.0 - 34.0 (pg)   MCHC 31.8  30.0 - 36.0 (g/dL)   RDW 12.6  11.5 - 15.5 (%)   Platelets 252  150 - 400 (K/uL)  PROTIME-INR      Component Value Range   Prothrombin Time 26.5 (*) 11.6 - 15.2 (seconds)   INR 2.39 (*) 0.00 - 1.49   CBC      Component Value Range   WBC 6.1  4.0 - 10.5 (K/uL)   RBC 3.47 (*) 3.87 - 5.11 (MIL/uL)   Hemoglobin 9.9 (*) 12.0 - 15.0 (g/dL)   HCT 30.0 (*) 36.0 - 46.0 (%)   MCV 86.5  78.0 - 100.0 (fL)   MCH 28.5  26.0 - 34.0 (pg)   MCHC 33.0  30.0 - 36.0 (g/dL)   RDW 12.7  11.5 - 15.5 (%)   Platelets 256  150 - 400 (K/uL)    Discharge Medications:   Discharge Medication List as of 01/18/2011  1:38 PM    START taking these medications   Details  oxyCODONE-acetaminophen (PERCOCET) 5-325 MG per tablet Take 2 tablets by mouth every 4 (four) hours as needed., Starting 01/18/2011, Until Mon 01/28/11, Print      CONTINUE these medications which have CHANGED   Details  methocarbamol (ROBAXIN) 500 MG tablet Take 1 tablet (500 mg total) by mouth every 6 (six) hours as needed., Starting 01/18/2011, Until Mon 01/28/11, Print    warfarin (COUMADIN) 2.5 MG tablet Take 1 tablet (2.5 mg total) by mouth one time only at 6 PM., Starting 01/18/2011, Until Mon 02/18/11, Print      CONTINUE  these medications which have NOT CHANGED   Details  Calcium Citrate-Vitamin D (CITRACAL + D PO) Take 1 tablet by mouth daily.  , Until Discontinued, Historical Med    Multiple Vitamins-Minerals (MULTIVITAMINS THER. W/MINERALS) TABS Take 1 tablet by mouth daily.  , Until Discontinued, Historical Med      STOP taking these medications     traMADol (ULTRAM) 50 MG tablet      acetaminophen (TYLENOL) 500 MG tablet         Diagnostic Studies: X-ray Knee Right Port  01/15/2011  *RADIOLOGY REPORT*  Clinical Data: Postop right knee arthroplasty  PORTABLE RIGHT KNEE - 1-2 VIEW  Comparison: None.  Findings: Status post right knee arthroplasty.  Surgical drain projects over the lateral soft tissues.  Subcutaneous gas is not unexpected.  No immediate evidence for hardware complication.  Skin staples anteriorly.  IMPRESSION: Status post right knee arthroplasty without evidence of hardware complication.  Original Report Authenticated By: Suanne Marker, M.D.    Disposition: Home or Self Care  Discharge Orders    Future Orders Please Complete By Expires   Diet - low sodium heart healthy      Call MD / Call 911      Comments:   If you experience chest pain or shortness of breath, CALL 911 and be transported to the hospital emergency room.  If you develope a fever above 101 F, pus (white drainage) or increased drainage or redness at the wound, or calf pain, call your surgeon's office.   Constipation Prevention      Comments:   Drink plenty of fluids.  Prune juice may be helpful.  You may use a stool softener, such as Colace (over the counter) 100 mg twice a day.  Use MiraLax (over the counter) for constipation as needed.   Increase activity slowly as tolerated  Weight Bearing as taught in Physical Therapy      Comments:   Use a walker or crutches as instructed.   Discharge instructions      Comments:   Keep incision dry cpm 2 hours per 8 hours - 6 hours  per day           Signed: DEAN,GREGORY SCOTT 02/03/2011, 8:50 PM

## 2011-07-29 HISTORY — PX: COLONOSCOPY: SHX5424

## 2013-06-08 ENCOUNTER — Encounter: Payer: Self-pay | Admitting: Gastroenterology

## 2013-08-06 ENCOUNTER — Ambulatory Visit: Payer: BC Managed Care – PPO | Admitting: Gastroenterology

## 2015-01-29 HISTORY — PX: NASAL SINUS SURGERY: SHX719

## 2015-08-25 ENCOUNTER — Encounter (HOSPITAL_COMMUNITY): Payer: Self-pay

## 2015-10-16 ENCOUNTER — Telehealth: Payer: Self-pay | Admitting: Cardiology

## 2015-10-16 NOTE — Telephone Encounter (Signed)
Received records from [facility] for appointment on 11/10/15 with Dr Stanford Breed.  Records given to [facility] (medical records) for Dr Jacalyn Lefevre schedule on 11/10/15.  lp

## 2015-11-08 NOTE — Progress Notes (Signed)
HPI: 65 yo female for evaluation of dyspnea. Laboratories May 2017 showed total cholesterol 242 with LDL 160. BUN 16 and creatinine 0.71. Patient describes dyspnea on exertion but denies orthopnea, PND, pedal edema, chest pain, palpitations or syncope. 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  Current Outpatient Prescriptions  Medication Sig Dispense Refill  . aspirin 81 MG tablet Take 81 mg by mouth daily.    . Cyanocobalamin (VITAMIN B-12 PO) Take 1 tablet by mouth daily.    Marland Kitchen MAGNESIUM PO Take 1 tablet by mouth daily.    Marland Kitchen omeprazole (PRILOSEC) 40 MG capsule Take 40 mg by mouth daily.     No current facility-administered medications for this visit.     No Known Allergies   Past Medical History:  Diagnosis Date  . Arthritis    knees and lower back  . Cancer ([place])    skin cancer on nose  . DOE (dyspnea on exertion)   . Dyslipidemia   . GERD (gastroesophageal reflux disease)     Past Surgical History:  Procedure Laterality Date  . ABDOMINAL HYSTERECTOMY  1996  . arthroscopy left knee    . JOINT REPLACEMENT  10-2010  . KNEE ARTHROPLASTY  01/15/2011   Procedure: COMPUTER ASSISTED TOTAL KNEE ARTHROPLASTY;  Surgeon: Meredith Pel;  Location: [place];  Service: Orthopedics;  Laterality: Right;  Right total knee arthroplasty  . NASAL SINUS SURGERY    . right foot surgery    . TONSILLECTOMY      Social History   Social History  . Marital status: Married    Spouse name: N/A  . Number of children: 3  . Years of education: N/A   Occupational History  . Not on file.   Social History Main Topics  . Smoking status: Former Smoker    Packs/day: 0.50    Years: 16.00    Types: Cigarettes    Quit date: 01/11/1991  . Smokeless tobacco: Never Used  . Alcohol use Yes     Comment: occasional wine  . Drug use: No  . Sexual activity: Yes   Other Topics Concern  . Not on file   Social History Narrative  . No narrative on file    Family History  Problem  Relation Age of Onset  . Mental illness Mother     ROS: no fevers or chills, productive cough, hemoptysis, dysphasia, odynophagia, melena, hematochezia, dysuria, hematuria, rash, seizure activity, orthopnea, PND, pedal edema, claudication. Remaining systems are negative.  Physical Exam:   Blood pressure 116/80, pulse 81, height 5' 3"  (1.6 m), weight 190 lb (86.2 kg).  General:  Well developed/obese in NAD Skin warm/dry Patient not depressed No peripheral clubbing Back-normal HEENT-normal/normal eyelids Neck supple/normal carotid upstroke bilaterally; no bruits; no JVD; no thyromegaly chest - CTA/ normal expansion CV - RRR/normal S1 and S2; no murmurs, rubs or gallops;  PMI nondisplaced Abdomen -NT/ND, no HSM, no mass, + bowel sounds, no bruit 2+ femoral pulses, no bruits Ext-no edema, chords, 2+ DP Neuro-grossly nonfocal  ECG sinus rhythm at a rate of 81. No ST changes.  A/P  1 dyspnea-possible contribution from deconditioning and size. I will arrange an echocardiogram to assess LV function. Schedule exercise treadmill to screen for coronary disease. If normal I have encouraged her to increase her activities and exercise. She would also benefit from weight loss.  2 hyperlipidemia-recent LDL 160. She has no other risk factors including no family history of coronary disease, diabetes mellitus, hypertension  and she does not smoke. Therefore statin therapy at this point is not recommended based on guidelines. She will begin with diet and exercise and her cholesterol can be rechecked in 3-4 months by primary care.  3 gastroesophageal reflux disease-management per primary care.  Kirk Ruths, MD   Kirk Ruths, MD

## 2015-11-10 ENCOUNTER — Encounter: Payer: Self-pay | Admitting: Cardiology

## 2015-11-10 ENCOUNTER — Ambulatory Visit (INDEPENDENT_AMBULATORY_CARE_PROVIDER_SITE_OTHER): Payer: BC Managed Care – PPO | Admitting: Cardiology

## 2015-11-10 VITALS — BP 116/80 | HR 81 | Ht 63.0 in | Wt 190.0 lb

## 2015-11-10 DIAGNOSIS — R0600 Dyspnea, unspecified: Secondary | ICD-10-CM

## 2015-11-10 DIAGNOSIS — E784 Other hyperlipidemia: Secondary | ICD-10-CM

## 2015-11-10 DIAGNOSIS — E7849 Other hyperlipidemia: Secondary | ICD-10-CM

## 2015-11-10 NOTE — Patient Instructions (Signed)
Medication Instructions:   NO CHANGE  Testing/Procedures:  Your physician has requested that you have an echocardiogram. Echocardiography is a painless test that uses sound waves to create images of your heart. It provides your doctor with information about the size and shape of your heart and how well your heart's chambers and valves are working. This procedure takes approximately one hour. There are no restrictions for this procedure.   Your physician has requested that you have an exercise tolerance test. For further information please visit [email or web address]. Please also follow instruction sheet, as given.    Follow-Up:  Your physician recommends that you schedule a follow-up appointment in: AS NEEDED PENDING TEST RESULTS    Exercise Stress Electrocardiogram An exercise stress electrocardiogram is a test to check how blood flows to your heart. It is done to find areas of poor blood flow. You will need to walk on a treadmill for this test. The electrocardiogram will record your heartbeat when you are at rest and when you are exercising. BEFORE THE PROCEDURE  Do not have drinks with caffeine or foods with caffeine for 24 hours before the test, or as told by your doctor. This includes coffee, tea (even decaf tea), sodas, chocolate, and cocoa.  Follow your doctor's instructions about eating and drinking before the test.  Ask your doctor what medicines you should or should not take before the test. Take your medicines with water unless told by your doctor not to.  If you use an inhaler, bring it with you to the test.  Bring a snack to eat after the test.  Do not  smoke for 4 hours before the test.  Do not put lotions, powders, creams, or oils on your chest before the test.  Wear comfortable shoes and clothing. PROCEDURE  You will have patches put on your chest. Small areas of your chest may need to be shaved. Wires will be connected to the patches.  Your heart rate will be  watched while you are resting and while you are exercising.  You will walk on the treadmill. The treadmill will slowly get faster to raise your heart rate.  The test will take about 1-2 hours. AFTER THE PROCEDURE  Your heart rate and blood pressure will be watched after the test.  You may return to your normal diet, activities, and medicines or as told by your doctor.   This information is not intended to replace advice given to you by your health care provider. Make sure you discuss any questions you have with your health care provider.   Document Released: 07/03/2007 Document Revised: 02/04/2014 Document Reviewed: 09/21/2012 Elsevier Interactive Patient Education [employer].

## 2015-12-01 ENCOUNTER — Encounter (INDEPENDENT_AMBULATORY_CARE_PROVIDER_SITE_OTHER): Payer: Self-pay | Admitting: Orthopedic Surgery

## 2015-12-01 ENCOUNTER — Ambulatory Visit (INDEPENDENT_AMBULATORY_CARE_PROVIDER_SITE_OTHER): Payer: Self-pay

## 2015-12-01 ENCOUNTER — Ambulatory Visit (INDEPENDENT_AMBULATORY_CARE_PROVIDER_SITE_OTHER): Payer: BC Managed Care – PPO | Admitting: Orthopedic Surgery

## 2015-12-01 DIAGNOSIS — M25521 Pain in right elbow: Secondary | ICD-10-CM | POA: Diagnosis not present

## 2015-12-01 MED ORDER — BUPIVACAINE HCL 0.5 % IJ SOLN
3.0000 mL | INTRAMUSCULAR | Status: AC | PRN
  Administered 2015-12-01: 3 mL via INTRA_ARTICULAR

## 2015-12-01 MED ORDER — LIDOCAINE HCL 1 % IJ SOLN
3.0000 mL | INTRAMUSCULAR | Status: AC | PRN
  Administered 2015-12-01: 3 mL

## 2015-12-01 MED ORDER — TRAMADOL HCL 50 MG PO TABS: 50.0000 mg | ORAL_TABLET | Freq: Four times a day (QID) | ORAL | 0 refills | Status: DC | PRN

## 2015-12-01 MED ORDER — METHYLPREDNISOLONE ACETATE 40 MG/ML IJ SUSP
30.0000 mg | INTRAMUSCULAR | Status: AC | PRN
  Administered 2015-12-01: 30 mg via INTRA_ARTICULAR

## 2015-12-01 NOTE — Progress Notes (Signed)
Office Visit Note   Patient: Joann [name]           Date of Birth: 18-Nov-1950           MRN: [id number] Visit Date: 12/01/2015 Requested by: Vidal Schwalbe, MD [address] [place], [place] [address] PCP: Vidal Schwalbe, MD  Subjective: Chief Complaint  Patient presents with  . Right Elbow - Pain    HPI Joann [name] is a 65 year old female with right elbow pain.  She hasn't an history of right elbow arthritis and has actually had some type of loose body issue 5 years ago with the elbow locked up.  She was able to reduce it on her own.  Now for the last 2 weeks she states that she cannot bend it all the way and thinks that there's something this popped out and it "will not pop back in".  States that her muscles are very sore.  Prior to 2 weeks ago the elbow was normal in terms of functionality and range of motion.  She states it goes in and out and has done so dozens of times.  This time however it is not gone back in.  Been taking some medication for but it is not helped.              Review of Systems all systems reviewed negative as they relate to the chief complaint.  No fevers or chills   Assessment & Plan: Visit Diagnoses:  1. Right elbow pain     Plan: Impression is right elbow locking with definite mechanical symptoms and arthritis on plain radiographs.  High likelihood that loose bodies present in the joint.  Today ultrasound-guided injection is performed and we will get an MRI scan to more clearly defined the possibility of an occult loose body in the joint  Follow-Up Instructions: Return for after MRI.   Orders:  Orders Placed This Encounter  Procedures  . XR Elbow 2 Views Right   No orders of the defined types were placed in this encounter.     Procedures: Medium Joint Inj Date/Time: 12/01/2015 9:39 AM Performed by: Meredith Pel Authorized by: Meredith Pel   Consent Given by:  Patient Site marked: the procedure site was marked   Timeout: prior to  procedure the correct patient, procedure, and site was verified   Indications:  Pain and joint swelling Location:  Elbow Site:  R elbow Needle Size:  22 G Needle Length:  1.5 inches Approach:  Posterolateral Ultrasound Guided: Yes   Fluoroscopic Guidance: No   Medications:  3 mL lidocaine 1 %; 30 mg methylPREDNISolone acetate 40 MG/ML; 3 mL bupivacaine 0.5 % Aspiration Attempted: No   Patient tolerance:  Patient tolerated the procedure well with no immediate complications     Clinical Data: No additional findings.  Objective: Vital Signs: There were no vitals taken for this visit.  Physical Exam  Constitutional: She appears well-developed.  HENT:  Head: Normocephalic.  Eyes: EOM are normal.  Neck: Normal range of motion.  Cardiovascular: Normal rate.   Pulmonary/Chest: Effort normal.  Neurological: She is alert.  Skin: Skin is warm.  Psychiatric: She has a normal mood and affect.    Ortho Exam examination the right arm demonstrates full pronation supination good grip EPL FPL interosseous dissection and extension biceps triceps off or strength.  She does have pain with attempted extension beyond 30 of the right elbow.  Flexion past 90 is also painful.  Trace effusion is present  in the elbow.  Localizes pain primarily around the cubital tunnel region.  I will detect any course grinding or crepitus with range of motion that she has.  The proximal lymphadenopathy is present.  Specialty Comments:  No specialty comments available.  Imaging: Xr Elbow 2 Views Right  Result Date: 12/01/2015 2 views right ordered right elbow ordered and obtained AP and lateral.  Osteophytes and degenerative changes are present.  No discrete loose body is present in the joint however.  Bone structure and soft tissue structure otherwise maintained    PMFS History: There are no active problems to display for this patient.  Past Medical History:  Diagnosis Date  . Arthritis    knees and lower  back  . Cancer ([facility])    skin cancer on nose  . DOE (dyspnea on exertion)   . Dyslipidemia   . GERD (gastroesophageal reflux disease)     Family History  Problem Relation Age of Onset  . Mental illness Mother     Past Surgical History:  Procedure Laterality Date  . ABDOMINAL HYSTERECTOMY  1996  . arthroscopy left knee    . JOINT REPLACEMENT  10-2010  . KNEE ARTHROPLASTY  01/15/2011   Procedure: COMPUTER ASSISTED TOTAL KNEE ARTHROPLASTY;  Surgeon: Meredith Pel;  Location: [facility];  Service: Orthopedics;  Laterality: Right;  Right total knee arthroplasty  . NASAL SINUS SURGERY    . right foot surgery    . TONSILLECTOMY     Social History   Occupational History  . Not on file.   Social History Main Topics  . Smoking status: Former Smoker    Packs/day: 0.50    Years: 16.00    Types: Cigarettes    Quit date: 01/11/1991  . Smokeless tobacco: Never Used  . Alcohol use Yes     Comment: occasional wine  . Drug use: No  . Sexual activity: Yes

## 2015-12-15 ENCOUNTER — Telehealth (HOSPITAL_COMMUNITY): Payer: Self-pay

## 2015-12-15 NOTE — Telephone Encounter (Signed)
Encounter complete. 

## 2015-12-17 ENCOUNTER — Ambulatory Visit
Admission: RE | Admit: 2015-12-17 | Discharge: 2015-12-17 | Disposition: A | Discharge status: Home / Self Care | Payer: BC Managed Care – PPO | Source: Ambulatory Visit | Attending: Orthopedic Surgery | Admitting: Orthopedic Surgery

## 2015-12-17 DIAGNOSIS — M25521 Pain in right elbow: Secondary | ICD-10-CM

## 2015-12-18 ENCOUNTER — Other Ambulatory Visit (HOSPITAL_COMMUNITY): Payer: BC Managed Care – PPO

## 2015-12-19 ENCOUNTER — Inpatient Hospital Stay (HOSPITAL_COMMUNITY): Admission: RE | Admit: 2015-12-19 | Payer: BC Managed Care – PPO | Source: Ambulatory Visit

## 2015-12-19 ENCOUNTER — Other Ambulatory Visit (HOSPITAL_COMMUNITY): Payer: BC Managed Care – PPO

## 2015-12-29 ENCOUNTER — Encounter (INDEPENDENT_AMBULATORY_CARE_PROVIDER_SITE_OTHER): Payer: Self-pay | Admitting: Orthopedic Surgery

## 2015-12-29 ENCOUNTER — Encounter (INDEPENDENT_AMBULATORY_CARE_PROVIDER_SITE_OTHER): Payer: Self-pay

## 2015-12-29 ENCOUNTER — Ambulatory Visit (INDEPENDENT_AMBULATORY_CARE_PROVIDER_SITE_OTHER): Payer: BC Managed Care – PPO | Admitting: Orthopedic Surgery

## 2015-12-29 DIAGNOSIS — M25521 Pain in right elbow: Secondary | ICD-10-CM

## 2015-12-29 DIAGNOSIS — M24021 Loose body in right elbow: Secondary | ICD-10-CM | POA: Diagnosis not present

## 2015-12-29 NOTE — Progress Notes (Signed)
Office Visit Note   Patient: Joann [name]           Date of Birth: 08/02/1950           MRN: [id number] Visit Date: 12/29/2015 Requested by: Vidal Schwalbe, MD [address] [place], [place] [address] PCP: Vidal Schwalbe, MD  Subjective: Chief Complaint  Patient presents with  . Right Elbow - Pain    HPI Joann [name] is a 65 year old female with right elbow pain.  4 weeks it's been difficult for her to straighten the arm.  Since of [place] she's had an MRI scan.  That is reviewed with the patient today.  She does have on her humeral and radiocapitellar arthritis but in addition to that she has a fairly significantly large loose body in the posterior aspect of the olecranon fossa.  She did have an injection into the radiocapitellar joint which did not help even for a day.  She is taking tramadol for pain.              Review of Systems All systems reviewed are negative as they relate to the chief complaint within the history of present illness.  Patient denies  fevers or chills.    Assessment & Plan: Visit Diagnoses:  1. Loose body in right elbow   2. Right elbow pain     Plan: Impression is right elbow loose body with arthritis.  Plan I think this loose body is locking and keeping her from achieving full extension.  This is been going on now for around 4-6 weeks.  I think it's important to remove this fragment from the posterior elbow joint.  That can be done in an open fashion with fairly minimal morbidity.  There are no other loose bodies in the anterior aspect of the joint.  Risks and benefits are discussed along with time out of work.  All questions answered.  Follow-Up Instructions: Return for 7 days after surgery.   Orders:  No orders of the defined types were placed in this encounter.  No orders of the defined types were placed in this encounter.     Procedures: No procedures performed   Clinical Data: No additional findings.  Objective: Vital Signs: There were no  vitals taken for this visit.  Physical Exam  Constitutional: She appears well-developed.  HENT:  Head: Normocephalic.  Eyes: EOM are normal.  Neck: Normal range of motion.  Cardiovascular: Normal rate.   Pulmonary/Chest: Effort normal.  Neurological: She is alert.  Skin: Skin is warm.  Psychiatric: She has a normal mood and affect.    Ortho Exam examination the right elbow demonstrates pretty reasonable pronation supination but lacking 25 of full extension.  Flexion is to about 130.  Motor sensory function to the hand is intact radial pulses intact  Specialty Comments:  No specialty comments available.  Imaging: No results found.   PMFS History: There are no active problems to display for this patient.  Past Medical History:  Diagnosis Date  . Arthritis    knees and lower back  . Cancer ([facility])    skin cancer on nose  . DOE (dyspnea on exertion)   . Dyslipidemia   . GERD (gastroesophageal reflux disease)     Family History  Problem Relation Age of Onset  . Mental illness Mother     Past Surgical History:  Procedure Laterality Date  . ABDOMINAL HYSTERECTOMY  1996  . arthroscopy left knee    . JOINT REPLACEMENT  [id number]  . KNEE ARTHROPLASTY  01/15/2011   Procedure: COMPUTER ASSISTED TOTAL KNEE ARTHROPLASTY;  Surgeon: Meredith Pel;  Location: [facility];  Service: Orthopedics;  Laterality: Right;  Right total knee arthroplasty  . NASAL SINUS SURGERY    . right foot surgery    . TONSILLECTOMY     Social History   Occupational History  . Not on file.   Social History Main Topics  . Smoking status: Former Smoker    Packs/day: 0.50    Years: 16.00    Types: Cigarettes    Quit date: 01/11/1991  . Smokeless tobacco: Never Used  . Alcohol use Yes     Comment: occasional wine  . Drug use: No  . Sexual activity: Yes

## 2016-01-25 ENCOUNTER — Inpatient Hospital Stay (INDEPENDENT_AMBULATORY_CARE_PROVIDER_SITE_OTHER): Payer: BC Managed Care – PPO | Admitting: Physician Assistant

## 2016-01-25 ENCOUNTER — Encounter: Payer: Self-pay | Admitting: Gastroenterology

## 2016-01-26 ENCOUNTER — Inpatient Hospital Stay (INDEPENDENT_AMBULATORY_CARE_PROVIDER_SITE_OTHER): Payer: BC Managed Care – PPO | Admitting: Orthopedic Surgery

## 2016-02-07 ENCOUNTER — Telehealth (HOSPITAL_COMMUNITY): Payer: Self-pay

## 2016-02-07 NOTE — Telephone Encounter (Signed)
Encounter complete. 

## 2016-02-09 ENCOUNTER — Inpatient Hospital Stay (HOSPITAL_COMMUNITY): Admission: RE | Admit: 2016-02-09 | Payer: BC Managed Care – PPO | Source: Ambulatory Visit

## 2016-02-20 ENCOUNTER — Other Ambulatory Visit (INDEPENDENT_AMBULATORY_CARE_PROVIDER_SITE_OTHER): Payer: Self-pay | Admitting: Orthopedic Surgery

## 2016-02-20 DIAGNOSIS — M24021 Loose body in right elbow: Secondary | ICD-10-CM

## 2016-02-22 ENCOUNTER — Other Ambulatory Visit (HOSPITAL_COMMUNITY): Payer: Self-pay | Admitting: *Deleted

## 2016-02-22 NOTE — Pre-Procedure Instructions (Signed)
    ADALINE TREJOS  02/22/2016      [place], [place] [address] Phone: [phone] Fax: [phone]    Your procedure is scheduled on 02-27-2016    Tuesday   Report to [facility] Admitting at 1:30 PM   Call this number if you have problems the morning of surgery:  [phone]   Remember:  Do not eat food or drink liquids after midnight.   Take these medicines the morning of surgery with A SIP OF WATER  Omeprazole(Prilosec).Tramadol if needed             STOP ASPIRIN,ANTIINFLAMATORIES (IBUPROFEN,ALEVE,MOTRIN,ADVIL,GOODY'S POWDERS),HERBAL SUPPLEMENTS,FISH OIL,AND VITAMINS 5-7 DAYS PRIOR TO SURGERY   Do not wear jewelry, make-up or nail polish.  Do not wear lotions, powders, or perfumes, or deoderant.  Do not shave 48 hours prior to surgery.   .  Do not bring valuables to the hospital.  [facility] is not responsible for any belongings or valuables.  Contacts, dentures or bridgework may not be worn into surgery.  Leave your suitcase in the car.  After surgery it may be brought to your room.  For patients admitted to the hospital, discharge time will be determined by your treatment team.  Patients discharged the day of surgery will not be allowed to drive home.    Special instructions:  See attached Sheet for instructions on CHG showers

## 2016-02-23 ENCOUNTER — Encounter (HOSPITAL_COMMUNITY)
Admission: RE | Admit: 2016-02-23 | Discharge: 2016-02-23 | Disposition: A | Discharge status: Home / Self Care | Payer: BC Managed Care – PPO | Source: Ambulatory Visit | Attending: Orthopedic Surgery | Admitting: Orthopedic Surgery

## 2016-02-23 ENCOUNTER — Encounter (HOSPITAL_COMMUNITY): Payer: Self-pay

## 2016-02-23 DIAGNOSIS — Z01812 Encounter for preprocedural laboratory examination: Secondary | ICD-10-CM | POA: Insufficient documentation

## 2016-02-23 LAB — CBC
HEMATOCRIT: 41.7 % (ref 36.0–46.0)
HEMOGLOBIN: 13.7 g/dL (ref 12.0–15.0)
MCH: 29.5 pg (ref 26.0–34.0)
MCHC: 32.9 g/dL (ref 30.0–36.0)
MCV: 89.9 fL (ref 78.0–100.0)
Platelets: 254 10*3/uL (ref 150–400)
RBC: 4.64 MIL/uL (ref 3.87–5.11)
RDW: 13.1 % (ref 11.5–15.5)
WBC: 7.6 10*3/uL (ref 4.0–10.5)

## 2016-02-27 ENCOUNTER — Encounter (HOSPITAL_COMMUNITY): Payer: Self-pay | Admitting: *Deleted

## 2016-02-27 ENCOUNTER — Ambulatory Visit (HOSPITAL_COMMUNITY): Payer: BC Managed Care – PPO | Admitting: Certified Registered Nurse Anesthetist

## 2016-02-27 ENCOUNTER — Ambulatory Visit (HOSPITAL_COMMUNITY)
Admission: RE | Admit: 2016-02-27 | Discharge: 2016-02-27 | Disposition: A | Discharge status: Home / Self Care | Payer: BC Managed Care – PPO | Source: Ambulatory Visit | Attending: Orthopedic Surgery | Admitting: Orthopedic Surgery

## 2016-02-27 ENCOUNTER — Encounter (HOSPITAL_COMMUNITY)
Admission: RE | Disposition: A | Discharge status: Home / Self Care | Payer: Self-pay | Source: Ambulatory Visit | Attending: Orthopedic Surgery

## 2016-02-27 DIAGNOSIS — Z87891 Personal history of nicotine dependence: Secondary | ICD-10-CM | POA: Insufficient documentation

## 2016-02-27 DIAGNOSIS — Z79899 Other long term (current) drug therapy: Secondary | ICD-10-CM | POA: Insufficient documentation

## 2016-02-27 DIAGNOSIS — Z85828 Personal history of other malignant neoplasm of skin: Secondary | ICD-10-CM | POA: Diagnosis not present

## 2016-02-27 DIAGNOSIS — M24021 Loose body in right elbow: Secondary | ICD-10-CM | POA: Insufficient documentation

## 2016-02-27 DIAGNOSIS — K219 Gastro-esophageal reflux disease without esophagitis: Secondary | ICD-10-CM | POA: Diagnosis not present

## 2016-02-27 DIAGNOSIS — Z7982 Long term (current) use of aspirin: Secondary | ICD-10-CM | POA: Diagnosis not present

## 2016-02-27 DIAGNOSIS — Z96651 Presence of right artificial knee joint: Secondary | ICD-10-CM | POA: Insufficient documentation

## 2016-02-27 HISTORY — PX: FOREIGN BODY REMOVAL: SHX962

## 2016-02-27 SURGERY — REMOVAL FOREIGN BODY EXTREMITY
Anesthesia: General | Site: Elbow | Laterality: Right

## 2016-02-27 MED ORDER — CEFAZOLIN SODIUM-DEXTROSE 2-4 GM/100ML-% IV SOLN
INTRAVENOUS | Status: AC
  Filled 2016-02-27: qty 100

## 2016-02-27 MED ORDER — DEXAMETHASONE SODIUM PHOSPHATE 10 MG/ML IJ SOLN
INTRAMUSCULAR | Status: DC | PRN
  Administered 2016-02-27: 10 mg via INTRAVENOUS

## 2016-02-27 MED ORDER — PROPOFOL 10 MG/ML IV BOLUS
INTRAVENOUS | Status: DC | PRN
  Administered 2016-02-27: 150 mg via INTRAVENOUS

## 2016-02-27 MED ORDER — LACTATED RINGERS IV SOLN
INTRAVENOUS | Status: DC
  Administered 2016-02-27 (×2): via INTRAVENOUS

## 2016-02-27 MED ORDER — MIDAZOLAM HCL 5 MG/5ML IJ SOLN
INTRAMUSCULAR | Status: DC | PRN
  Administered 2016-02-27: 2 mg via INTRAVENOUS

## 2016-02-27 MED ORDER — MIDAZOLAM HCL 2 MG/2ML IJ SOLN
INTRAMUSCULAR | Status: AC
  Filled 2016-02-27: qty 2

## 2016-02-27 MED ORDER — CEFAZOLIN SODIUM-DEXTROSE 2-4 GM/100ML-% IV SOLN
2.0000 g | INTRAVENOUS | Status: AC
  Administered 2016-02-27: 2 g via INTRAVENOUS

## 2016-02-27 MED ORDER — PROPOFOL 10 MG/ML IV BOLUS
INTRAVENOUS | Status: AC
Start: 2016-02-27 — End: 2016-02-27
  Filled 2016-02-27: qty 20

## 2016-02-27 MED ORDER — BUPIVACAINE HCL (PF) 0.5 % IJ SOLN
INTRAMUSCULAR | Status: AC
  Filled 2016-02-27: qty 30

## 2016-02-27 MED ORDER — FENTANYL CITRATE (PF) 100 MCG/2ML IJ SOLN
INTRAMUSCULAR | Status: AC
  Filled 2016-02-27: qty 4

## 2016-02-27 MED ORDER — MORPHINE SULFATE (PF) 4 MG/ML IV SOLN
INTRAVENOUS | Status: DC | PRN
  Administered 2016-02-27 (×2): 4 mg

## 2016-02-27 MED ORDER — CHLORHEXIDINE GLUCONATE 4 % EX LIQD: 60.0000 mL | Freq: Once | CUTANEOUS | Status: DC

## 2016-02-27 MED ORDER — PROMETHAZINE HCL 25 MG/ML IJ SOLN: 6.2500 mg | INTRAMUSCULAR | Status: DC | PRN

## 2016-02-27 MED ORDER — ROCURONIUM BROMIDE 10 MG/ML (PF) SYRINGE
PREFILLED_SYRINGE | INTRAVENOUS | Status: DC | PRN
  Administered 2016-02-27: 40 mg via INTRAVENOUS

## 2016-02-27 MED ORDER — BUPIVACAINE HCL (PF) 0.5 % IJ SOLN
INTRAMUSCULAR | Status: DC | PRN
  Administered 2016-02-27: 5 mL

## 2016-02-27 MED ORDER — CLONIDINE HCL (ANALGESIA) 100 MCG/ML EP SOLN
150.0000 ug | Freq: Once | EPIDURAL | Status: DC
  Filled 2016-02-27 (×2): qty 1.5

## 2016-02-27 MED ORDER — SUGAMMADEX SODIUM 200 MG/2ML IV SOLN
INTRAVENOUS | Status: DC | PRN
  Administered 2016-02-27: 200 mg via INTRAVENOUS

## 2016-02-27 MED ORDER — LIDOCAINE 2% (20 MG/ML) 5 ML SYRINGE
INTRAMUSCULAR | Status: DC | PRN
  Administered 2016-02-27: 60 mg via INTRAVENOUS

## 2016-02-27 MED ORDER — HYDROMORPHONE HCL 1 MG/ML IJ SOLN
INTRAMUSCULAR | Status: AC
  Filled 2016-02-27: qty 0.5

## 2016-02-27 MED ORDER — HYDROMORPHONE HCL 1 MG/ML IJ SOLN
0.2500 mg | INTRAMUSCULAR | Status: DC | PRN
  Administered 2016-02-27: 0.5 mg via INTRAVENOUS

## 2016-02-27 MED ORDER — FENTANYL CITRATE (PF) 100 MCG/2ML IJ SOLN
INTRAMUSCULAR | Status: AC
  Filled 2016-02-27: qty 2

## 2016-02-27 MED ORDER — PHENYLEPHRINE 40 MCG/ML (10ML) SYRINGE FOR IV PUSH (FOR BLOOD PRESSURE SUPPORT)
PREFILLED_SYRINGE | INTRAVENOUS | Status: AC
Start: 2016-02-27 — End: 2016-02-27
  Filled 2016-02-27: qty 10

## 2016-02-27 MED ORDER — FENTANYL CITRATE (PF) 100 MCG/2ML IJ SOLN
INTRAMUSCULAR | Status: DC | PRN
  Administered 2016-02-27: 100 ug via INTRAVENOUS
  Administered 2016-02-27 (×2): 50 ug via INTRAVENOUS

## 2016-02-27 MED ORDER — MORPHINE SULFATE (PF) 4 MG/ML IV SOLN
INTRAVENOUS | Status: AC
  Filled 2016-02-27: qty 2

## 2016-02-27 MED ORDER — ONDANSETRON HCL 4 MG/2ML IJ SOLN
INTRAMUSCULAR | Status: DC | PRN
  Administered 2016-02-27: 4 mg via INTRAVENOUS

## 2016-02-27 SURGICAL SUPPLY — 56 items
BANDAGE ACE 4X5 VEL STRL LF (GAUZE/BANDAGES/DRESSINGS) ×2 IMPLANT
BENZOIN TINCTURE PRP APPL 2/3 (GAUZE/BANDAGES/DRESSINGS) IMPLANT
BLADE SURG ROTATE 9660 (MISCELLANEOUS) IMPLANT
BNDG COHESIVE 1X5 TAN STRL LF (GAUZE/BANDAGES/DRESSINGS) IMPLANT
BNDG CONFORM 3 STRL LF (GAUZE/BANDAGES/DRESSINGS) IMPLANT
BNDG ELASTIC 2X5.8 VLCR STR LF (GAUZE/BANDAGES/DRESSINGS) IMPLANT
BNDG ESMARK 4X9 LF (GAUZE/BANDAGES/DRESSINGS) IMPLANT
CORDS BIPOLAR (ELECTRODE) ×2 IMPLANT
COVER SURGICAL LIGHT HANDLE (MISCELLANEOUS) ×2 IMPLANT
CUFF TOURNIQUET SINGLE 18IN (TOURNIQUET CUFF) IMPLANT
CUFF TOURNIQUET SINGLE 24IN (TOURNIQUET CUFF) IMPLANT
DRAPE INCISE IOBAN 66X45 STRL (DRAPES) IMPLANT
DRAPE OEC MINIVIEW 54X84 (DRAPES) IMPLANT
DRAPE U-SHAPE 47X51 STRL (DRAPES) IMPLANT
DRSG EMULSION OIL 3X3 NADH (GAUZE/BANDAGES/DRESSINGS) IMPLANT
DRSG TEGADERM 4X4.75 (GAUZE/BANDAGES/DRESSINGS) ×2 IMPLANT
DURAPREP 26ML APPLICATOR (WOUND CARE) ×2 IMPLANT
ELECT REM PT RETURN 9FT ADLT (ELECTROSURGICAL)
ELECTRODE REM PT RTRN 9FT ADLT (ELECTROSURGICAL) IMPLANT
GAUZE SPONGE 2X2 8PLY STRL LF (GAUZE/BANDAGES/DRESSINGS) IMPLANT
GAUZE SPONGE 4X4 12PLY STRL (GAUZE/BANDAGES/DRESSINGS) IMPLANT
GAUZE XEROFORM 1X8 LF (GAUZE/BANDAGES/DRESSINGS) ×2 IMPLANT
GLOVE BIOGEL PI IND STRL 8 (GLOVE) ×1 IMPLANT
GLOVE BIOGEL PI INDICATOR 8 (GLOVE) ×1
GLOVE SURG ORTHO 8.0 STRL STRW (GLOVE) ×2 IMPLANT
GOWN STRL REUS W/ TWL LRG LVL3 (GOWN DISPOSABLE) ×2 IMPLANT
GOWN STRL REUS W/TWL LRG LVL3 (GOWN DISPOSABLE) ×2
KIT BASIN OR (CUSTOM PROCEDURE TRAY) ×2 IMPLANT
KIT ROOM TURNOVER OR (KITS) ×2 IMPLANT
MANIFOLD NEPTUNE II (INSTRUMENTS) ×2 IMPLANT
NS IRRIG 1000ML POUR BTL (IV SOLUTION) ×2 IMPLANT
PACK ORTHO EXTREMITY (CUSTOM PROCEDURE TRAY) ×2 IMPLANT
PAD ARMBOARD 7.5X6 YLW CONV (MISCELLANEOUS) ×4 IMPLANT
PENCIL BUTTON HOLSTER BLD 10FT (ELECTRODE) IMPLANT
SLING ARM FOAM STRAP MED (SOFTGOODS) ×2 IMPLANT
SPECIMEN JAR SMALL (MISCELLANEOUS) ×2 IMPLANT
SPONGE GAUZE 2X2 STER 10/PKG (GAUZE/BANDAGES/DRESSINGS)
SPONGE GAUZE 4X4 12PLY STER LF (GAUZE/BANDAGES/DRESSINGS) ×2 IMPLANT
STRIP CLOSURE SKIN 1/2X4 (GAUZE/BANDAGES/DRESSINGS) ×2 IMPLANT
SUCTION FRAZIER HANDLE 10FR (MISCELLANEOUS)
SUCTION TUBE FRAZIER 10FR DISP (MISCELLANEOUS) IMPLANT
SUT ETHIBOND 4 0 TF (SUTURE) IMPLANT
SUT ETHIBOND 5 0 P 3 (SUTURE)
SUT ETHILON 4 0 P 3 18 (SUTURE) IMPLANT
SUT ETHILON 5 0 P 3 18 (SUTURE)
SUT NYLON ETHILON 5-0 P-3 1X18 (SUTURE) IMPLANT
SUT POLY ETHIBOND 5-0 P-3 1X18 (SUTURE) IMPLANT
SUT PROLENE 4 0 P 3 18 (SUTURE) IMPLANT
SUT SILK 4 0 PS 2 (SUTURE) IMPLANT
SUT VIC AB 2-0 CT1 27 (SUTURE) ×1
SUT VIC AB 2-0 CT1 TAPERPNT 27 (SUTURE) ×1 IMPLANT
SUT VIC AB 3-0 FS2 27 (SUTURE) IMPLANT
TOWEL OR 17X24 6PK STRL BLUE (TOWEL DISPOSABLE) ×2 IMPLANT
TOWEL OR 17X26 10 PK STRL BLUE (TOWEL DISPOSABLE) ×2 IMPLANT
TUBE CONNECTING 12X1/4 (SUCTIONS) IMPLANT
WATER STERILE IRR 1000ML POUR (IV SOLUTION) ×2 IMPLANT

## 2016-02-27 NOTE — Transfer of Care (Signed)
Immediate Anesthesia Transfer of Care Note  Patient: Joann [name]  Procedure(s) Performed: Procedure(s): REMOVAL FOREIGN BODY EXTREMITY (Right)  Patient Location: PACU  Anesthesia Type:General  Level of Consciousness: awake, alert , oriented and patient cooperative  Airway & Oxygen Therapy: Patient Spontanous Breathing and Patient connected to nasal cannula oxygen  Post-op Assessment: Report given to RN and Post -op Vital signs reviewed and stable  Post vital signs: Reviewed and stable  Last Vitals:  Vitals:   02/27/16 1154 02/27/16 1601  BP: 128/70   Pulse: 77   Resp: 20   Temp: 36.7 C 36.5 C    Last Pain:  Vitals:   02/27/16 1154  TempSrc: Oral      Patients Stated Pain Goal: 1 (00/76/22 6333)  Complications: No apparent anesthesia complications

## 2016-02-27 NOTE — Anesthesia Preprocedure Evaluation (Addendum)
Anesthesia Evaluation  Patient identified by MRN, date of birth, ID band Patient awake    Reviewed: Allergy & Precautions, NPO status , Patient's Chart, lab work & pertinent test results  Airway Mallampati: II  TM Distance: >3 FB Neck ROM: Full    Dental  (+) Dental Advisory Given   Pulmonary former smoker,    breath sounds clear to auscultation       Cardiovascular negative cardio ROS   Rhythm:Regular Rate:Normal     Neuro/Psych negative neurological ROS     GI/Hepatic Neg liver ROS, GERD  Medicated,  Endo/Other  negative endocrine ROS  Renal/GU negative Renal ROS     Musculoskeletal  (+) Arthritis ,   Abdominal   Peds  Hematology negative hematology ROS (+)   Anesthesia Other Findings   Reproductive/Obstetrics                            Lab Results  Component Value Date   WBC 7.6 02/23/2016   HGB 13.7 02/23/2016   HCT 41.7 02/23/2016   MCV 89.9 02/23/2016   PLT 254 02/23/2016   Lab Results  Component Value Date   CREATININE 0.55 01/16/2011   BUN 5 (L) 01/16/2011   NA 138 01/16/2011   K 4.2 01/16/2011   CL 105 01/16/2011   CO2 25 01/16/2011    Anesthesia Physical Anesthesia Plan  ASA: II  Anesthesia Plan: General   Post-op Pain Management:    Induction: Intravenous  Airway Management Planned: Oral ETT  Additional Equipment:   Intra-op Plan:   Post-operative Plan: Extubation in OR  Informed Consent: I have reviewed the patients History and Physical, chart, labs and discussed the procedure including the risks, benefits and alternatives for the proposed anesthesia with the patient or authorized representative who has indicated his/her understanding and acceptance.   Dental advisory given  Plan Discussed with: CRNA  Anesthesia Plan Comments:        Anesthesia Quick Evaluation

## 2016-02-27 NOTE — Brief Op Note (Signed)
02/27/2016  3:57 PM  PATIENT:  Joann [name]  66 y.o. female  PRE-OPERATIVE DIAGNOSIS:  Right elbow loose body  POST-OPERATIVE DIAGNOSIS:  Right elbow loose body  PROCEDURE:  Procedure(s): REMOVAL FOREIGN BODY EXTREMITY  SURGEON:  Surgeon(s): Meredith Pel, MD  ASSISTANT: Laure Kidney rnfa  ANESTHESIA:   general  EBL: 10 ml    Total I/O In: 750 [I.V.:750] Out: -   BLOOD ADMINISTERED: none  DRAINS: none   LOCAL MEDICATIONS USED:  Marcaine mso4   SPECIMEN:  Loose body  COUNTS:  YES  TOURNIQUET:   Total Tourniquet Time Documented: Upper Arm (Right) - 17 minutes Total: Upper Arm (Right) - 17 minutes   DICTATION: .Other Dictation: Dictation Number [id number]  PLAN OF CARE: Discharge to home after PACU  PATIENT DISPOSITION:  PACU - hemodynamically stable

## 2016-02-27 NOTE — Anesthesia Postprocedure Evaluation (Signed)
Anesthesia Post Note  Patient: Joann [name]  Procedure(s) Performed: Procedure(s) (LRB): REMOVAL FOREIGN BODY EXTREMITY (Right)  Patient location during evaluation: PACU Anesthesia Type: General Level of consciousness: awake and alert Pain management: pain level controlled Vital Signs Assessment: post-procedure vital signs reviewed and stable Respiratory status: spontaneous breathing, nonlabored ventilation, respiratory function stable and patient connected to nasal cannula oxygen Cardiovascular status: blood pressure returned to baseline and stable Postop Assessment: no signs of nausea or vomiting Anesthetic complications: no       Last Vitals:  Vitals:   02/27/16 1154 02/27/16 1601  BP: 128/70   Pulse: 77   Resp: 20   Temp: 36.7 C 36.5 C    Last Pain:  Vitals:   02/27/16 1154  TempSrc: Oral                 Tiajuana Amass

## 2016-02-27 NOTE — H&P (Signed)
Joann [name] is an 66 y.o. female.   Chief Complaint: Right elbow pain HPI: [name] is a 66 year old female with right elbow pain and locking.  This is an on for several years but it is been worse lately.  She feels like something gets stuck and she cannot get it out of place in order to facilitate motion of her elbow.  MRI scan does show a 1.3 cm loose body in the olecranon fossa.  She also has some degenerative changes in the radiocapitellar joint but no obvious loose bodies in the anterior compartment of the elbow.  Past Medical History:  Diagnosis Date  . Arthritis    knees and lower back  . Cancer ([place])    skin cancer on nose  . DOE (dyspnea on exertion)   . Dyslipidemia   . GERD (gastroesophageal reflux disease)     Past Surgical History:  Procedure Laterality Date  . ABDOMINAL HYSTERECTOMY  1996  . arthroscopy left knee    . JOINT REPLACEMENT  10-2010  . KNEE ARTHROPLASTY  01/15/2011   Procedure: COMPUTER ASSISTED TOTAL KNEE ARTHROPLASTY;  Surgeon: Meredith Pel;  Location: [facility];  Service: Orthopedics;  Laterality: Right;  Right total knee arthroplasty  . NASAL SINUS SURGERY    . right foot surgery    . TONSILLECTOMY      Family History  Problem Relation Age of Onset  . Mental illness Mother    Social History:  reports that she quit smoking about 25 years ago. Her smoking use included Cigarettes. She has a 8.00 pack-year smoking history. She has never used smokeless tobacco. She reports that she drinks alcohol. She reports that she does not use drugs.  Allergies:  Allergies  Allergen Reactions  . No Known Allergies     No prescriptions prior to admission.    No results found for this or any previous visit (from the past 48 hour(s)). No results found.  Review of Systems  Musculoskeletal: Positive for joint pain.  All other systems reviewed and are negative.   There were no vitals taken for this visit. Physical Exam  Constitutional: She appears  well-developed.  HENT:  Head: Normocephalic.  Eyes: Pupils are equal, round, and reactive to light.  Cardiovascular: Normal rate.   Respiratory: Effort normal.  Neurological: She is alert.  Skin: Skin is warm.  Psychiatric: She has a normal mood and affect.   Examination of the right elbow demonstrates good motor sensory function with palpable radial pulse.  She is lacking about the 5-10 of full extension.  Flexion is past 120.  She has full pronation supination.  She does have tenderness posteriorly.  On her nerve does not subluxate in the medial aspect of the elbow  Assessment/Plan Impression is right elbow loose body in the posterior compartment likely limiting extension and giving her her mechanical symptoms.  Plan is open removal of the loose body.  Risks and benefits discussed with the patient including not limited to infection or vessel damage as well as potential for worsening of the existing arthritis its in her elbow.  All questions answered.  Anderson Malta, MD 02/27/2016, 9:52 AM

## 2016-02-28 ENCOUNTER — Encounter (HOSPITAL_COMMUNITY): Payer: Self-pay | Admitting: Orthopedic Surgery

## 2016-02-28 NOTE — Op Note (Signed)
NAME:  LONETTA, BLASSINGAME NO.:  [id number]  MEDICAL RECORD NO.:  [id number]  LOCATION:  PERIO                        FACILITY:  [facility]  PHYSICIAN:  Anderson Malta, M.D.    DATE OF BIRTH:  [birth year]  DATE OF PROCEDURE: DATE OF DISCHARGE:                              OPERATIVE REPORT   PREOPERATIVE DIAGNOSIS:  Right elbow loose body.  POSTOPERATIVE DIAGNOSIS:  Right elbow loose body.  PROCEDURE:  Right elbow loose body removal.  SURGEON:  Anderson Malta, M.D.  ASSISTANT:  Laure Kidney, RNFA.  INDICATIONS:  Jaycelyn is a 66 year old patient with right arm loose body in the posterior compartment, who presents now for operative management after explanation of risks and benefits.  PROCEDURE IN DETAIL:  The patient was brought to the operating room where general anesthetic was induced.  Preoperative antibiotics were administered.  Time-out was called.  The patient was placed in a bean bag with the left axilla and peroneal nerve well-padded.  The right elbow was then prescrubbed with alcohol and Betadine, allowed to air dry, prepped with DuraPrep solution, and draped in sterile manner. Ioban used to cover the operative field.  The arm was elevated and exsanguinated with the Esmarch wrap, total tourniquet time of 60 minutes.  Incision was made through the triceps tendon posteriorly. Care was taken to avoid injury to the ulnar nerve.  The joint capsule was entered posteriorly and a loose body was identified and removed. Sent to pathology.  It measured about 1.3 cm.  Thorough irrigation was performed, no loose bodies were present.  At this time, the capsule was closed using 2-0 Vicryl suture, the triceps split was closed using #1 Vicryl suture.  Tourniquet was released at this time.  Bleeding points encountered were controlled with electrocautery.  Skin was closed using 2-0 Vicryl followed by 3-0 Monocryl.  Steri-Strips, impervious dressing, and Ace wrap applied.  The  patient tolerated procedure well without immediate complications, transferred to the recovery room in stable condition.     Anderson Malta, M.D.     GSD/MEDQ  D:  02/27/2016  T:  02/28/2016  Job:  [id number]

## 2016-03-04 ENCOUNTER — Ambulatory Visit (INDEPENDENT_AMBULATORY_CARE_PROVIDER_SITE_OTHER): Payer: BC Managed Care – PPO | Admitting: Orthopedic Surgery

## 2016-03-04 ENCOUNTER — Encounter (INDEPENDENT_AMBULATORY_CARE_PROVIDER_SITE_OTHER): Payer: Self-pay | Admitting: Orthopedic Surgery

## 2016-03-04 DIAGNOSIS — M24021 Loose body in right elbow: Secondary | ICD-10-CM

## 2016-03-04 MED ORDER — TRAMADOL HCL 50 MG PO TABS: 50.0000 mg | ORAL_TABLET | Freq: Four times a day (QID) | ORAL | 0 refills | Status: DC | PRN

## 2016-03-04 NOTE — Progress Notes (Signed)
   Post-Op Visit Note   Patient: Joann [name]           Date of Birth: 12/08/1950           MRN: [id number] Visit Date: 03/04/2016 PCP: Vidal Schwalbe, MD   Assessment & Plan:  Chief Complaint:  Chief Complaint  Patient presents with  . Right Elbow - Routine Post Op   Visit Diagnoses:  1. Loose body in right elbow     Plan: Joann [name] is a 66 year old female who is now a week out right elbow loose body removal from the olecranon fossa.  Incision intact.  Range of motion is still somewhat limited but it's improving.  Motor sensory function to the hand is intact.  Incisions intact.  Plan at this time is for her to continue to work on range of motion and strengthening on her own.  Out of work for more weeks then okay to return to regular duty without restriction.  I may change her over from Percocet to tramadol.  Follow-up with me as needed  Follow-Up Instructions: No Follow-up on file.   Orders:  No orders of the defined types were placed in this encounter.  No orders of the defined types were placed in this encounter.   Imaging: No results found.  PMFS History: Patient Active Problem List   Diagnosis Date Noted  . Loose body in right elbow 03/04/2016   Past Medical History:  Diagnosis Date  . Arthritis    knees and lower back  . Cancer ([place])    skin cancer on nose  . DOE (dyspnea on exertion)   . Dyslipidemia   . GERD (gastroesophageal reflux disease)     Family History  Problem Relation Age of Onset  . Mental illness Mother     Past Surgical History:  Procedure Laterality Date  . ABDOMINAL HYSTERECTOMY  1996  . arthroscopy left knee    . FOREIGN BODY REMOVAL Right 02/27/2016   Procedure: REMOVAL FOREIGN BODY EXTREMITY;  Surgeon: Meredith Pel, MD;  Location: [facility];  Service: Orthopedics;  Laterality: Right;  . JOINT REPLACEMENT  10-2010  . KNEE ARTHROPLASTY  01/15/2011   Procedure: COMPUTER ASSISTED TOTAL KNEE ARTHROPLASTY;  Surgeon: Meredith Pel;   Location: [facility];  Service: Orthopedics;  Laterality: Right;  Right total knee arthroplasty  . NASAL SINUS SURGERY    . right foot surgery    . TONSILLECTOMY     Social History   Occupational History  . Not on file.   Social History Main Topics  . Smoking status: Former Smoker    Packs/day: 0.50    Years: 16.00    Types: Cigarettes    Quit date: 01/11/1991  . Smokeless tobacco: Never Used  . Alcohol use Yes     Comment: occasional wine  . Drug use: No  . Sexual activity: Yes

## 2016-03-07 ENCOUNTER — Encounter (HOSPITAL_COMMUNITY): Payer: Self-pay | Admitting: Radiology

## 2016-03-15 ENCOUNTER — Encounter (INDEPENDENT_AMBULATORY_CARE_PROVIDER_SITE_OTHER): Payer: Self-pay

## 2016-03-15 ENCOUNTER — Other Ambulatory Visit (INDEPENDENT_AMBULATORY_CARE_PROVIDER_SITE_OTHER): Payer: BC Managed Care – PPO

## 2016-03-15 ENCOUNTER — Ambulatory Visit: Payer: BC Managed Care – PPO | Admitting: Gastroenterology

## 2016-03-15 ENCOUNTER — Encounter: Payer: Self-pay | Admitting: Gastroenterology

## 2016-03-15 ENCOUNTER — Ambulatory Visit (INDEPENDENT_AMBULATORY_CARE_PROVIDER_SITE_OTHER): Payer: BC Managed Care – PPO | Admitting: Gastroenterology

## 2016-03-15 VITALS — BP 104/70 | HR 80 | Ht 63.0 in | Wt 193.1 lb

## 2016-03-15 DIAGNOSIS — R1032 Left lower quadrant pain: Secondary | ICD-10-CM

## 2016-03-15 DIAGNOSIS — K580 Irritable bowel syndrome with diarrhea: Secondary | ICD-10-CM

## 2016-03-15 LAB — IGA: IGA: 88 mg/dL (ref 68–378)

## 2016-03-15 MED ORDER — HYOSCYAMINE SULFATE 0.125 MG SL SUBL: 0.1250 mg | SUBLINGUAL_TABLET | Freq: Four times a day (QID) | SUBLINGUAL | 1 refills | Status: DC | PRN

## 2016-03-15 NOTE — Patient Instructions (Addendum)
  Food Guidelines for a sensitive stomach  Many people have difficulty digesting certain foods, causing a variety of distressing and embarrassing symptoms such as abdominal pain, bloating and gas.  These foods may need to be avoided or consumed in small amounts.  Here are some tips that might be helpful for you.  1.   Lactose intolerance is the difficulty or complete inability to digest lactose, the natural sugar in milk and anything made from milk.  This condition is harmless, common, and can begin any time during life.  Some people can digest a modest amount of lactose while others cannot tolerate any.  Also, not all dairy products contain equal amounts of lactose.  For example, hard cheeses such as parmesan have less lactose than soft cheeses such as cheddar.  Yogurt has less lactose than milk or cheese.  Many packaged foods (even many brands of bread) have milk, so read ingredient lists carefully.  It is difficult to test for lactose intolerance, so just try avoiding lactose as much as possible for a week and see what happens with your symptoms.  If you seem to be lactose intolerant, the best plan is to avoid it (but make sure you get calcium from another source).  The next best thing is to use lactase enzyme supplements, available over the counter everywhere.  Just know that many lactose intolerant people need to take several tablets with each serving of dairy to avoid symptoms.  Lastly, a lot of restaurant food is made with milk or butter.  Many are things you might not suspect, such as mashed potatoes, rice and pasta (cooked with butter) and "grilled" items.  If you are lactose intolerant, it never hurts to ask your server what has milk or butter.  2.   Fiber is an important part of your diet, but not all fiber is well-tolerated.  Insoluble fiber such as bran is often consumed by normal gut bacteria and converted into gas.  Soluble fiber such as oats, squash, carrots and green beans are typically  tolerated better.  3.   Some types of carbohydrates can be poorly digested.  Examples include: fructose (apples, cherries, pears, raisins and other dried fruits), fructans (onions, zucchini, large amounts of wheat), sorbitol/mannitol/xylitol and sucralose/Splenda (common artificial sweeteners), and raffinose (lentils, broccoli, cabbage, asparagus, brussel sprouts, many types of beans).  Do a Development worker, community for [employer] and you will find helpful information. Beano, a dietary supplement, will often help with raffinose-containing foods.  As with lactase tablets, you may need several per serving.  4.   Whenever possible, avoid processed food&meats and chemical additives.  High fructose corn syrup, a common sweetener, may be difficult to digest.  Eggs and soy (comes from the soybean, and added to many foods now) are the other most common bloating/gassy foods.   If you are age 68 or older, your body mass index should be between 23-30. Your Body mass index is 34.21 kg/m. If this is out of the aforementioned range listed, please consider follow up with your Primary Care Provider.  If you are age 18 or younger, your body mass index should be between 19-25. Your Body mass index is 34.21 kg/m. If this is out of the aformentioned range listed, please consider follow up with your Primary Care Provider.   Thank you for choosing [facility]  Dr Wilfrid Lund III

## 2016-03-15 NOTE — Progress Notes (Signed)
[facility] Consult Note:  History: Joann [name] 03/15/2016  Referring physician: Vidal Schwalbe, MD  Reason for consult/chief complaint: Diarrhea ( intermittent alternating with constipation X 1 1/2 years.  Past history of colonoscopy and endoscopy.)   Subjective  HPI:  This is a 66 year old woman referred by primary care to see [place] for abdominal pain and diarrhea. She reports irregular bowel habits "all my life". She reports seeing Dr. West [name] in [place] for these symptoms about 4 years ago and a colonoscopy was performed. I'm afraid we do not have those reports today. She recalls that it was normal, he told her she had "spastic colon", and she should attempt to stress reduction. Several days a week she has bandlike crampy lower abdominal pain that is of acute onset and then followed by passage of loose nonbloody stool. The time she will have the pain or the diarrhea but not both. When it occurs, it sets off which she believes to be hemorrhoidal bleeding. Sometimes she feels the hemorrhoids prolapse as well and thinks they may also be causing small volume fecal incontinence. She is been taking Imodium as needed when the symptoms occur, but this then causes constipation with no BM for about 2 days. Cycle then begins again. She has no skin rash, lips swelling, flushing or sweating when the symptoms occur. She has had no improvement trying probiotics. 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 Joann [name] denies frequent heartburn, dysphagia, nausea, vomiting, early satiety or weight loss.  ROS:  Review of Systems  Constitutional: Negative for appetite change and unexpected weight change.  HENT: Negative for mouth sores and voice change.   Eyes: Negative for pain and redness.  Respiratory: Negative for cough and shortness of breath.   Cardiovascular: Negative for chest pain and  palpitations.  Genitourinary: Negative for dysuria and hematuria.  Musculoskeletal: Negative for arthralgias and myalgias.  Skin: Negative for pallor and rash.  Neurological: Negative for weakness and headaches.  Hematological: Negative for adenopathy.     Past Medical History: Past Medical History:  Diagnosis Date  . Arthritis    knees and lower back  . Cancer (Joann [name])    skin cancer on nose  . DOE (dyspnea on exertion)   . Dyslipidemia   . GERD (gastroesophageal reflux disease)      Past Surgical History: Past Surgical History:  Procedure Laterality Date  . ABDOMINAL HYSTERECTOMY  1996  . arthroscopy left knee    . FOREIGN BODY REMOVAL Right 02/27/2016   Procedure: REMOVAL FOREIGN BODY EXTREMITY;  Surgeon: Meredith Pel, MD;  Location: [facility];  Service: Orthopedics;  Laterality: Right;  . JOINT REPLACEMENT  10-2010  . KNEE ARTHROPLASTY  01/15/2011   Procedure: COMPUTER ASSISTED TOTAL KNEE ARTHROPLASTY;  Surgeon: Meredith Pel;  Location: [facility];  Service: Orthopedics;  Laterality: Right;  Right total knee arthroplasty  . NASAL SINUS SURGERY    . right foot surgery    . TONSILLECTOMY       Family History: Family History  Problem Relation Age of Onset  . Alzheimer's disease Mother   . Colon cancer Neg Hx   . Stomach cancer Neg Hx   . Rectal cancer Neg Hx   . Esophageal cancer Neg Hx   . Liver cancer Neg Hx     Social History: Social History   Social History  . Marital status: Married    Spouse name: N/A  .  Number of children: 3  . Years of education: N/A   Occupational History  . court reporter    Social History Main Topics  . Smoking status: Former Smoker    Packs/day: 0.50    Years: 16.00    Types: Cigarettes    Quit date: 01/11/1991  . Smokeless tobacco: Never Used  . Alcohol use Yes     Comment: occasional wine  . Drug use: No  . Sexual activity: Yes    Birth control/ protection: Surgical   Other Topics Concern  . None   Social  History Narrative  . None    Allergies: Allergies  Allergen Reactions  . No Known Allergies     Outpatient Meds: Current Outpatient Prescriptions  Medication Sig Dispense Refill  . aspirin EC 81 MG tablet Take 81 mg by mouth daily.    Marland Kitchen b complex vitamins capsule Take 1 capsule by mouth daily.    . Cholecalciferol (VITAMIN D3) 5000 units TABS Take 5,000 Units by mouth daily.    . magnesium oxide (MAG-OX) 400 MG tablet Take 400 mg by mouth daily.    Marland Kitchen omeprazole (PRILOSEC OTC) 20 MG tablet Take 20 mg by mouth daily as needed (for acid reflux/hearburn.).    Marland Kitchen Soft Lens Products (REWETTING DROPS) SOLN Apply 1 drop to eye daily as needed (for dry contact lenses).    . traMADol (ULTRAM) 50 MG tablet Take 1 tablet (50 mg total) by mouth every 6 (six) hours as needed. (Patient taking differently: Take 50 mg by mouth as needed (for pain.). ) 30 tablet 0  . hyoscyamine (LEVSIN SL) 0.125 MG SL tablet Place 1 tablet (0.125 mg total) under the tongue every 6 (six) hours as needed. 45 tablet 1   No current facility-administered medications for this visit.       ___________________________________________________________________ Objective   Exam:  BP 104/70   Pulse 80   Ht 5' 3"  (1.6 m)   Wt 193 lb 2 oz (87.6 kg)   BMI 34.21 kg/m    General: this is a(n) Overweight well-appearing woman with good muscle mass, normal vocal quality   Eyes: sclera anicteric, no redness  ENT: oral mucosa moist without lesions, no cervical or supraclavicular lymphadenopathy, good dentition  CV: RRR without murmur, S1/S2, no JVD, no peripheral edema  Resp: clear to auscultation bilaterally, normal RR and effort noted  GI: soft, mild LLQ tenderness, with active bowel sounds. No guarding or palpable organomegaly noted. Rectus diastasis  Skin; warm and dry, no rash or jaundice noted  Neuro: awake, alert and oriented x 3. Normal gross motor function and fluent speech  Labs:  CBC Latest Ref Rng &  Units 02/23/2016 01/18/2011 01/17/2011  WBC 4.0 - 10.5 K/uL 7.6 6.1 6.7  Hemoglobin 12.0 - 15.0 g/dL 13.7 9.9(L) 9.8(L)  Hematocrit 36.0 - 46.0 % 41.7 30.0(L) 30.8(L)  Platelets 150 - 400 K/uL 254 256 252     No radiologic studies for review   Assessment: Encounter Diagnoses  Name Primary?  . Irritable bowel syndrome with diarrhea Yes  . LLQ abdominal pain     She seems to have classic symptoms of diarrhea predominant IBS. I think her reported constipation seems to be the effect of Imodium. Rectal exam was not performed today, since she recalls having had this bleeding well before her colonoscopy in [place]. It certainly sounds like hemorrhoidal bleeding that is exacerbated by diarrhea.  Plan:  Labs to check for celiac sprue Trial of hyoscyamine 0.125 mg every  6 hours as needed for cramps and diarrhea Written dietary advice for typical IBS triggers was given Follow-up with me in about 6 weeks or sooner as needed. We will attempt to obtain her previous colonoscopy and  biopsy reports, if any.  Thank you for the courtesy of this consult.  Please call me with any questions or concerns.  Nelida Meuse III  CC: Joann Schwalbe, MD

## 2016-03-18 LAB — TISSUE TRANSGLUTAMINASE, IGA: TISSUE TRANSGLUTAMINASE AB, IGA: 1 U/mL (ref <4)

## 2016-03-19 ENCOUNTER — Telehealth (HOSPITAL_COMMUNITY): Payer: Self-pay | Admitting: Cardiology

## 2016-03-19 NOTE — Telephone Encounter (Signed)
Patient has cancelled appt twice( 12/18/15 and 02/09/15) I spoke with her to today to see if she was interested in rescheduling these appts, she voiced that she just had surgery on her elbow and would call back in a couple of weeks to set these up again. Due to the overwhelming amount of patients in the workqueue. She will be removed until she is ready reschedule these test.

## 2016-05-31 ENCOUNTER — Telehealth: Payer: Self-pay | Admitting: Gastroenterology

## 2016-05-31 NOTE — Telephone Encounter (Signed)
Not next, Tues, please keep 5/22 appt.  At that time, I will do an anoscopy and evaluate hemorrhoids.  Also, I do not recall getting prior colonoscopy report from [facility].  If not, please make another request for those records.

## 2016-05-31 NOTE — Telephone Encounter (Signed)
Left patient a message to please have her last colonoscopy report and any pathology report faxed to our office. I do not see a signed medical record request in our records. I will also try calling them to see if they will send the reports.

## 2016-05-31 NOTE — Telephone Encounter (Signed)
Patient has been using OTC preparation H cream for her bleeding hemorrhoids, this is effective for one day only. She states she cannot get the suppositories in. She was supposed to come in 6 weeks after her 2/16 visit for a follow up. I have scheduled her an OV for first available on 5/22 to discuss this issue. You do have an available hemorrhoid banding opening next Tuesday if you would like me to put her in that slot?

## 2016-06-03 NOTE — Telephone Encounter (Signed)
Records received

## 2016-06-18 ENCOUNTER — Encounter: Payer: Self-pay | Admitting: Gastroenterology

## 2016-06-18 ENCOUNTER — Ambulatory Visit (INDEPENDENT_AMBULATORY_CARE_PROVIDER_SITE_OTHER): Payer: BC Managed Care – PPO | Admitting: Gastroenterology

## 2016-06-18 ENCOUNTER — Encounter (INDEPENDENT_AMBULATORY_CARE_PROVIDER_SITE_OTHER): Payer: Self-pay

## 2016-06-18 VITALS — BP 98/60 | HR 76 | Ht 64.0 in | Wt 196.0 lb

## 2016-06-18 DIAGNOSIS — K642 Third degree hemorrhoids: Secondary | ICD-10-CM

## 2016-06-18 DIAGNOSIS — K625 Hemorrhage of anus and rectum: Secondary | ICD-10-CM | POA: Diagnosis not present

## 2016-06-18 DIAGNOSIS — K580 Irritable bowel syndrome with diarrhea: Secondary | ICD-10-CM | POA: Diagnosis not present

## 2016-06-18 NOTE — Patient Instructions (Signed)
If you are age 68 or older, your body mass index should be between 23-30. Your Body mass index is 33.64 kg/m. If this is out of the aforementioned range listed, please consider follow up with your Primary Care Provider.  If you are age 84 or younger, your body mass index should be between 19-25. Your Body mass index is 33.64 kg/m. If this is out of the aformentioned range listed, please consider follow up with your Primary Care Provider.   Please call in a week with an update, ask for Almyra Free [phone]  Thank you for choosing [facility]  Dr Wilfrid Lund III

## 2016-06-18 NOTE — Progress Notes (Signed)
     [facility] Progress Note  Chief Complaint: Diarrhea and hemorrhoidal bleeding  Subjective  History:  Elanah follows up for the first time since her consult visit a few months ago. She continues to have frequent crampy lower abdominal pain with diarrhea that is typical for IBS. She will take up to 9 Imodium tablets to get some relief, and then she will have no BM for several days. When the diarrhea occurs, it is precipitating rectal bleeding. I finally received a colonoscopy report by Dr. West Carbo as noted below. It is a very limited report, there seems to been no colitis or polyps. She did not try the Levsin out of concern that might make her sedated.   ROS: Cardiovascular:  no chest pain Respiratory: no dyspnea Denies weight loss  The patient's Past Medical, Family and Social History were reviewed and are on file in the EMR.  Objective:  Med list reviewed  Vital signs in last 24 hrs: Vitals:   06/18/16 0830  BP: 98/60  Pulse: 76    Physical Exam    HEENT: sclera anicteric, oral mucosa moist without lesions  Neck: supple, no thyromegaly, JVD or lymphadenopathy  Cardiac: RRR without murmurs, S1S2 heard, no peripheral edema  Pulm: clear to auscultation bilaterally, normal RR and effort noted  Abdomen: soft, No tenderness, with active bowel sounds. No guarding or palpable hepatosplenomegaly.  Skin; warm and dry, no jaundice or rash Rectal exam and anoscopy ( chaperoned by our MA Toni):  Grade 3 internal hemorrhoids, nonbleeding at this time. Decreased anal sphincter tone, no palpable mass lesions in the rectum.  Recent Labs:  [facility] colon report 08/23/11: (linited report, no photos) good prep Mild sigmoid diverticulosis and redundant colon .  No mention hemorrhoids    @ASSESSMENTPLANBEGIN @ Assessment: Encounter Diagnoses  Name Primary?  . Irritable bowel syndrome with diarrhea Yes  . Prolapsed internal hemorrhoids, grade 3   . Rectal bleeding     She has classic symptoms of diarrhea predominant IBS for many years. It does not sound typical for bacterial overgrowth, celiac sprue or microscopic colitis with bandlike lower abdominal pain as a predominant symptom.  Certainly, if she has no improvement with IBS therapy, further testing including repeat colonoscopy may be necessary. 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  Plan: I encouraged her to try the Levsin on a day when she is not working. If it does not cause fatigue or other undesirable side effects, then I think it can be used during work days and hopefully improve her symptoms. If not, she was given a one-week sample pack of Viberzi 75 mg twice daily. The typical effect and side effects of this med were reviewed. I am hopeful that if we can control her frequent diarrhea, the hemorrhoids will improve as well. Given that they are grade 3 on exam today, I do not think they are likely amenable to banding, and would therefore require colorectal surgery consultation. She will call us in about a week with an update on symptoms, further plan to follow.  Total time 30 minutes, over half spent in counseling and coordination of care.   Nelida Meuse III

## 2016-07-02 ENCOUNTER — Telehealth: Payer: Self-pay | Admitting: Gastroenterology

## 2016-07-02 ENCOUNTER — Other Ambulatory Visit: Payer: Self-pay

## 2016-07-02 MED ORDER — ELUXADOLINE 75 MG PO TABS
75.0000 mg | ORAL_TABLET | Freq: Two times a day (BID) | ORAL | 2 refills | Status: DC
Start: 2016-07-02 — End: 2016-11-05

## 2016-07-02 NOTE — Telephone Encounter (Signed)
Called patient back, had to lvm, let her know that Rx will be sent to her pharmacy. Asked her to call back to schedule an office visit in 2-3 months. Advised that she does not need a colonoscopy at this time.

## 2016-07-02 NOTE — Telephone Encounter (Signed)
pt states that she has been taking the Viberzi 75 mg twice daily and it is working well. pt would like to know if she should get a prescription of this sent to the [facility] in [place]. pt would also like to know if Dr.Danis wants her to have colon now or not

## 2016-07-02 NOTE — Telephone Encounter (Signed)
Please advise, thank you.

## 2016-07-02 NOTE — Telephone Encounter (Signed)
I am glad to hear the medicine is helping her.  Please send a script for it at 75 mg twice daily to her preferred pharmacy.  Disp #60, RF 2  I would like to see her in clinic in 2-3 months to see how symptoms are doing and decide if it is the right long term med for her.  No, I do not think she needs a colonoscopy at this time.

## 2016-07-29 ENCOUNTER — Telehealth: Payer: Self-pay | Admitting: Gastroenterology

## 2016-07-29 NOTE — Telephone Encounter (Signed)
Spoke to patient she has been having increased bladder leakage for last 2 weeks. I have asked her to contact her PCP to discuss this with them, she said that her mom had this problem and had to have a bladder sling. She will contact PCP. Patient also states that within the last week she has had increase in rectal bleeding and diarrhea. She is continuing to take levsin and viberzi, said the medication worked at first then has slowly been less effective. I asked patient if she would like me to route this to DOD, she would prefer to have Dr. Loletha Carrow answer when he gets back on 7/5.

## 2016-07-29 NOTE — Telephone Encounter (Signed)
Patient states she is returning phone call to [facility] regarding this. Best call back number is [phone].

## 2016-07-29 NOTE — Telephone Encounter (Signed)
Called patient back, had to lvm. I am not sure why she needs a referral to a urologist. Asked if she is having urinary symptoms to contact her PCP to ask him about this. Also if I am not understanding correctly to please call back with more information.

## 2016-08-01 NOTE — Telephone Encounter (Signed)
Patient scheduled for pre-visit and colonoscopy. Patient requested a Friday appointment as that is when the person giving her a ride is off. Also let patient know to follow up with PCP to help with anxiety and work related stress.

## 2016-08-01 NOTE — Telephone Encounter (Signed)
Left message for patient to call back, she will need to be scheduled for colonoscopy at [facility] and a pre-visit, to further investigate inceased symptoms.

## 2016-08-01 NOTE — Telephone Encounter (Signed)
Last month I did not feel a colonoscopy was necessary since she had responded to the [place].  Since the diarrhea is worsening again despite that, I now feel a colonoscopy is needed to be sure we are not missing another cause for the symptoms.   As discussed at office visits, working with PCP for treatment of anxiety and work-related stress would likely help as well.

## 2016-08-14 ENCOUNTER — Telehealth: Payer: Self-pay | Admitting: *Deleted

## 2016-08-14 NOTE — Telephone Encounter (Signed)
Dr Loletha Carrow,  You have seen this pt several times this year for rectal bleeding and diarrhea. She is scheduled for a PV 7-26 and a colon with you 8-10 Friday.  Reviewing her chart, she saw cardio, Dr Stanford Breed, 11-10-2015 for dyspnea and he ordered an ETT and echo. Neither of these tests have been performed. She has scheduled and rescheduled several times per the telephone encounters in her chart. Last time she was called to RS, she was taken out of the queue, totally removed for these tests until she is ready to RS.  Are you okay to proceed with her colon on the 10th with out these.  Please advise and thanks for your time, Marijean Niemann

## 2016-08-14 NOTE — Telephone Encounter (Signed)
I reviewed Dr. Jacalyn Lefevre note.  The patient did not report chest pain, and Dr. Stanford Breed does not seem to have had a high suspicion for cardiac ischemia.  We are OK to proceed as scheduled for colonoscopy with me.

## 2016-08-22 ENCOUNTER — Telehealth: Payer: Self-pay

## 2016-08-22 ENCOUNTER — Ambulatory Visit (AMBULATORY_SURGERY_CENTER): Payer: Self-pay

## 2016-08-22 VITALS — Ht 64.0 in | Wt 192.2 lb

## 2016-08-22 DIAGNOSIS — Z1211 Encounter for screening for malignant neoplasm of colon: Secondary | ICD-10-CM

## 2016-08-22 DIAGNOSIS — K625 Hemorrhage of anus and rectum: Secondary | ICD-10-CM

## 2016-08-22 MED ORDER — NA SULFATE-K SULFATE-MG SULF 17.5-3.13-1.6 GM/177ML PO SOLN: 1.0000 | Freq: Once | ORAL | 0 refills | Status: AC

## 2016-08-22 NOTE — Telephone Encounter (Signed)
Dr. Loletha Carrow,  I just saw Joann [name] in [facility]. Joann [name] is scheduled for a colonoscopy on Friday 09/06/16. Patient takes Viberzi and wanted to know if she should stop taking it before she preps for the colonoscopy on 09/05/16? Joann [name] said that if she goes off of the medication it takes one day for diarrhea to return. Please advise if it would be counter productive to take Viberzi while drinking Suprep. Please advise. Thanks.  Riki Sheer, LPN ( [facility] )

## 2016-08-22 NOTE — Telephone Encounter (Signed)
Patient was called and left a message regarding stopping Viberzi prior to prepping for her colonoscopy. Ok to stop the day that she preps or even the day of her procedure.   Riki Sheer, LPN ([facility])

## 2016-08-22 NOTE — Progress Notes (Signed)
Denies allergies to eggs or soy products. Denies complication of anesthesia or sedation. Denies use of weight loss medication. Denies use of O2.   Emmi instructions declined.  

## 2016-08-22 NOTE — Telephone Encounter (Signed)
Thanks for checking. No Viberzi the day prior to or day of colonoscopy.

## 2016-09-06 ENCOUNTER — Encounter: Payer: Self-pay | Admitting: Gastroenterology

## 2016-09-06 ENCOUNTER — Ambulatory Visit (AMBULATORY_SURGERY_CENTER): Payer: BC Managed Care – PPO | Admitting: Gastroenterology

## 2016-09-06 VITALS — BP 125/74 | HR 80 | Temp 99.1°F | Resp 16 | Ht 64.0 in | Wt 196.0 lb

## 2016-09-06 DIAGNOSIS — D122 Benign neoplasm of ascending colon: Secondary | ICD-10-CM | POA: Diagnosis not present

## 2016-09-06 DIAGNOSIS — K580 Irritable bowel syndrome with diarrhea: Secondary | ICD-10-CM

## 2016-09-06 MED ORDER — SODIUM CHLORIDE 0.9 % IV SOLN: 500.0000 mL | INTRAVENOUS | Status: DC

## 2016-09-06 NOTE — Progress Notes (Signed)
Pt's states no medical or surgical changes since previsit or office visit. 

## 2016-09-06 NOTE — Progress Notes (Signed)
A and O x3. Report to RN. Tolerated MAC anesthesia well.

## 2016-09-06 NOTE — Op Note (Signed)
[place] Patient Name: Joann [name] Procedure Date: 09/06/2016 3:20 PM MRN: [id number] Endoscopist: Mallie Mussel L. Loletha Carrow , MD Age: 66 Referring MD:  Date of Birth: Sep 28, 1950 Gender: Female Account #: [id number] Procedure:                Colonoscopy Indications:              Lower abdominal pain, Chronic diarrhea, Rectal                            bleeding Medicines:                Monitored Anesthesia Care Procedure:                Pre-Anesthesia Assessment:                           - Prior to the procedure, a History and Physical                            was performed, and patient medications and                            allergies were reviewed. The patient's tolerance of                            previous anesthesia was also reviewed. The risks                            and benefits of the procedure and the sedation                            options and risks were discussed with the patient.                            All questions were answered, and informed consent                            was obtained. Anticoagulants: The patient has taken                            aspirin. It was decided not to withhold this                            medication prior to the procedure. ASA Grade                            Assessment: II - A patient with mild systemic                            disease. After reviewing the risks and benefits,                            the patient was deemed in satisfactory condition to  undergo the procedure.                           After obtaining informed consent, the colonoscope                            was passed under direct vision. Throughout the                            procedure, the patient's blood pressure, pulse, and                            oxygen saturations were monitored continuously. The                            Model PCF-H190DL [id number]) scope was introduced   through the anus and advanced to the the terminal                            ileum. The colonoscopy was performed without                            difficulty. The patient tolerated the procedure                            well. The quality of the bowel preparation was                            excellent. The terminal ileum, ileocecal valve,                            appendiceal orifice, and rectum were photographed.                            The quality of the bowel preparation was evaluated                            using the BBPS [facility] Bowel Preparation Scale)                            with scores of: Right Colon = 3, Transverse Colon =                            3 and Left Colon = 3 (entire mucosa seen well with                            no residual staining, small fragments of stool or                            opaque liquid). The total BBPS score equals 9. The                            bowel preparation used was SUPREP. Scope In: 3:47:17 PM Scope Out:  4:01:51 PM Scope Withdrawal Time: 0 hours 10 minutes 30 seconds  Total Procedure Duration: 0 hours 14 minutes 34 seconds  Findings:                 The digital rectal exam findings include decreased                            sphincter tone and internal hemorrhoids that                            prolapse with straining, but require manual                            replacement into the anal canal (Grade III).                           The terminal ileum appeared normal.                           Normal mucosa was found in the entire colon.                            Biopsies for histology were taken with a cold                            forceps from the right colon and left colon for                            evaluation of microscopic colitis.                           Many diverticula were found in the sigmoid colon.                           Two sessile polyps were found in the distal                             ascending colon. The polyps were 2 to 6 mm in size.                            These polyps were removed with a cold snare.                            Resection and retrieval were complete.                           The exam was otherwise without abnormality on                            direct and retroflexion views. Complications:            No immediate complications. Estimated Blood Loss:     Estimated blood loss: none. Impression:               - Decreased sphincter tone and internal hemorrhoids  that prolapse with straining, but require manual                            replacement into the anal canal (Grade III) found                            on digital rectal exam.                           - The examined portion of the ileum was normal.                           - Normal mucosa in the entire examined colon.                            Biopsied.                           - Diverticulosis in the sigmoid colon.                           - Two 2 to 6 mm polyps in the distal ascending                            colon, removed with a cold snare. Resected and                            retrieved.                           - The examination was otherwise normal on direct                            and retroflexion views. Recommendation:           - Patient has a contact number available for                            emergencies. The signs and symptoms of potential                            delayed complications were discussed with the                            patient. Return to normal activities tomorrow.                            Written discharge instructions were provided to the                            patient.                           - Resume previous diet.                           -  Continue present medications but decrease Viverzi                            to once daily.                           - Await pathology results.                            - Repeat colonoscopy is recommended for                            surveillance. The colonoscopy date will be                            determined after pathology results from today's                            exam become available for review.                           - Refer to a colo-rectal surgeon at the next                            available appointment for evaluation and treatment                            of hemorrhoids that bleed frequently. The degree of                            hemorrhoids and decreased sphincter tone appear to                            make banding unfeasible.                           If biopsies negative for microscopic colitis and                            patient continues to be sensitive to effect of                            Viberzi, discuss possible SSRI therapy with PCP. Henry L. Loletha Carrow, MD 09/06/2016 4:14:26 PM This report has been signed electronically.

## 2016-09-06 NOTE — Patient Instructions (Addendum)
YOU HAD AN ENDOSCOPIC PROCEDURE TODAY AT [facility]:   Refer to the procedure report that was given to you for any specific questions about what was found during the examination.  If the procedure report does not answer your questions, please call your gastroenterologist to clarify.  If you requested that your care partner not be given the details of your procedure findings, then the procedure report has been included in a sealed envelope for you to review at your convenience later.  YOU SHOULD EXPECT: Some feelings of bloating in the abdomen. Passage of more gas than usual.  Walking can help get rid of the air that was put into your GI tract during the procedure and reduce the bloating. If you had a lower endoscopy (such as a colonoscopy or flexible sigmoidoscopy) you may notice spotting of blood in your stool or on the toilet paper. If you underwent a bowel prep for your procedure, you may not have a normal bowel movement for a few days.  Please Note:  You might notice some irritation and congestion in your nose or some drainage.  This is from the oxygen used during your procedure.  There is no need for concern and it should clear up in a day or so.  SYMPTOMS TO REPORT IMMEDIATELY:   Following lower endoscopy (colonoscopy or flexible sigmoidoscopy):  Excessive amounts of blood in the stool  Significant tenderness or worsening of abdominal pains  Swelling of the abdomen that is new, acute  Fever of 100F or higher  For urgent or emergent issues, a gastroenterologist can be reached at any hour by calling [phone].   DIET:  We do recommend a small meal at first, but then you may proceed to your regular diet.  Drink plenty of fluids but you should avoid alcoholic beverages for 24 hours.  ACTIVITY:  You should plan to take it easy for the rest of today and you should NOT DRIVE or use heavy machinery until tomorrow (because of the sedation medicines used during the test).     FOLLOW UP: Our staff will call the number listed on your records the next business day following your procedure to check on you and address any questions or concerns that you may have regarding the information given to you following your procedure. If we do not reach you, we will leave a message.  However, if you are feeling well and you are not experiencing any problems, there is no need to return our call.  We will assume that you have returned to your regular daily activities without incident.  If any biopsies were taken you will be contacted by phone or by letter within the next 1-3 weeks.  Please call us at [phone] if you have not heard about the biopsies in 3 weeks.   Await for biopsy results Polyps (handout given) Hemorrhoids (handout given) Diverticulosis(handout) Continue present medications but decrease Viverzi to once daily Office will call you with a referral for Colo-rectal surgeon at the next available appointment    SIGNATURES/CONFIDENTIALITY: You and/or your care partner have signed paperwork which will be entered into your electronic medical record.  These signatures attest to the fact that that the information above on your After Visit Summary has been reviewed and is understood.  Full responsibility of the confidentiality of this discharge information lies with you and/or your care-partner.

## 2016-09-06 NOTE — Progress Notes (Signed)
Called to room to assist during endoscopic procedure.  Patient ID and intended procedure confirmed with present staff. Received instructions for my participation in the procedure from the performing physician.  

## 2016-09-09 ENCOUNTER — Telehealth: Payer: Self-pay | Admitting: Gastroenterology

## 2016-09-09 ENCOUNTER — Telehealth: Payer: Self-pay | Admitting: *Deleted

## 2016-09-09 NOTE — Telephone Encounter (Signed)
  Follow up Call-  Call back number 09/06/2016  Post procedure Call Back phone  # [phone]  Permission to leave phone message Yes  Some recent data might be hidden     Patient questions:  Message left to call us if necessary.

## 2016-09-09 NOTE — Telephone Encounter (Signed)
Please send a referral to Dr. Leighton Ruff at [facility] for this patient re: hemorrhoidal bleeding

## 2016-09-09 NOTE — Telephone Encounter (Signed)
Patient calling back states she had a bad day Saturday with bleeding and diarrhea. States she is doing better today.

## 2016-09-09 NOTE — Telephone Encounter (Signed)
  Follow up Call-  Call back number 09/06/2016  Post procedure Call Back phone  # [phone]  Permission to leave phone message Yes  Some recent data might be hidden     Patient questions:  Message left to call if necessary. Second call.

## 2016-09-10 NOTE — Telephone Encounter (Signed)
Records faxed to [facility] will await appointment information.

## 2016-09-11 ENCOUNTER — Encounter: Payer: Self-pay | Admitting: Gastroenterology

## 2016-09-11 NOTE — Telephone Encounter (Signed)
Appointment scheduled for 09-26-2016 with Dr Wilson/[facility] at 945am

## 2016-10-07 ENCOUNTER — Telehealth: Payer: Self-pay | Admitting: Gastroenterology

## 2016-10-07 NOTE — Telephone Encounter (Signed)
Routed to Dr. Loletha Carrow, tried to contact patient, no answer.

## 2016-10-07 NOTE — Telephone Encounter (Signed)
Patient states that medication viberzi isn't working but is requesting aprizo, due to it previously working for diarrhea. Pt also states Dr.Wilson at [facility] states pt is not good candidate for surgery right now. FYI

## 2016-10-09 NOTE — Telephone Encounter (Signed)
My apologies for the delay replying - I was out late last week and have been covering hospital service this week.  I do not know why somebody gave her Apriso before, but that is a medicine for inflammatory bowel disease and microscopic colitis, neither of which she has.  So that is not the right medicine for her.  She has not improved with levsin or viberzi.  Once daily viberzi does not work, but she told me that twice daily makes her constipated.  The surgeon does not want to do a procedure on the hemorrhoids because of the diarrhea.  I suppose he thinks it may not be successful unless we can control the diarrhea.  I sent him a note to explain the difficult situation Kellen is in and whether a hemorrhoid procedure may be reconsidered.  However, we have run out of meds to offer for this IBS.  I still contend that she may benefit from a medicine to reduce stress such as an SSRI (i.e. zoloft or cymbalta).  I recommend that she consult with her PCP about that, and please make sure the PCP haa my office and colonoscopy notes.

## 2016-10-10 NOTE — Telephone Encounter (Signed)
That is very interesting and surprising to hear. Although I have not seen this before, perhaps she has microscopic colitis that was not picked up on the biopsies.  Mesalamine (Apriso) is a low risk medicine with typically few side effects, so I am willing to give it a try for a month. Then I would like her to stop it for a week and then call [place] week or so later with an update.  I also send a message to Dr. Redmond Pulling, and he has reconsidered some procedure for the hemorrhoids.  If the Apriso helps a lot, then he would probably be more willing to do so.I encourage her to follow up with him.  Rx: Apriso 0.375 mg Sig: 4 capsules once daily. Disp #120 capsules, RF zero

## 2016-10-10 NOTE — Telephone Encounter (Signed)
Left message for pt to call back  °

## 2016-10-10 NOTE — Telephone Encounter (Signed)
Spoke with pt and she is aware. States the apriso was her daughters med and she took it. Reports when she was taking it she did not have any problems and her bowels were great. Pt was taking 1.5gm daily. She would like to have a script for this. Please advise.

## 2016-10-11 MED ORDER — MESALAMINE ER 0.375 G PO CP24: 1500.0000 mg | ORAL_CAPSULE | Freq: Every day | ORAL | 0 refills | Status: DC

## 2016-10-11 NOTE — Telephone Encounter (Signed)
Patient notified of the recommendations Rx sent to [facility] as she requests She understands to call back with an update 1 week after the completion of the rx

## 2016-10-11 NOTE — Addendum Note (Signed)
Addended by: Marlon Pel on: 10/11/2016 09:40 AM   Modules accepted: Orders

## 2016-11-04 ENCOUNTER — Telehealth: Payer: Self-pay | Admitting: Gastroenterology

## 2016-11-04 NOTE — Telephone Encounter (Signed)
Routed to Dr. Loletha Carrow.

## 2016-11-05 ENCOUNTER — Other Ambulatory Visit: Payer: Self-pay | Admitting: Gastroenterology

## 2016-11-05 NOTE — Telephone Encounter (Signed)
Left message for patient to call back, will relay message that Dr. Loletha Carrow has messaged surgeon, but would be best if she called them to schedule appointment.

## 2016-11-05 NOTE — Telephone Encounter (Signed)
I have already messaged the surgeon to tell her she is doing better. I will do so again, but he may not get it for several days.  So it would be better if she called the office to make an appointment with him.

## 2016-11-05 NOTE — Telephone Encounter (Signed)
Pt said she is returning your call °

## 2016-11-05 NOTE — Telephone Encounter (Signed)
Left message for patient that she will need to contact them and schedule appointment, left her their phone [phone]. Let her know that Dr. Loletha Carrow has sent the surgeon a message about her progress.

## 2016-11-06 NOTE — Telephone Encounter (Signed)
I am confused.  She told me before that viberzi was not helping - see prior notes. Did she send this or the pharmacy auto-send it?

## 2016-11-06 NOTE — Telephone Encounter (Signed)
Refill for Viberzi 72m BID.last seen 08-2016

## 2016-11-07 NOTE — Telephone Encounter (Signed)
Pt contacted. She has been using the Viberzi 75 mg  and the Apriso 0.375 mg. She takes one Viberzi in the am and 4 Apriso at night. She states this has controlled all of her symptoms. She has no cramping, no diarrhea and no rectal bleeding. Pt would like to continue the current treatment plan.

## 2016-11-07 NOTE — Telephone Encounter (Signed)
Pt has appointment as [facility] for 11-08-2016 with Dr Dema Severin.

## 2016-11-07 NOTE — Telephone Encounter (Signed)
I renewed the apriso prescription.  I was unable to e-prescribe the viberzi (says controled substance for some reason), so please see if you can work that out with her pharmacy.   Viverzi 75 mg once daily. Disp #30, Rf 4

## 2016-11-11 ENCOUNTER — Other Ambulatory Visit: Payer: Self-pay | Admitting: Gastroenterology

## 2016-11-12 ENCOUNTER — Other Ambulatory Visit: Payer: Self-pay

## 2016-11-13 ENCOUNTER — Telehealth: Payer: Self-pay | Admitting: Gastroenterology

## 2016-11-13 ENCOUNTER — Other Ambulatory Visit: Payer: Self-pay

## 2016-11-13 MED ORDER — ELUXADOLINE 75 MG PO TABS: 1.0000 | ORAL_TABLET | Freq: Two times a day (BID) | ORAL | 2 refills | Status: DC

## 2016-11-13 NOTE — Telephone Encounter (Signed)
Resent rx as requested.

## 2016-11-19 ENCOUNTER — Ambulatory Visit: Payer: Self-pay | Admitting: Surgery

## 2016-11-19 NOTE — H&P (Signed)
History of Present Illness Joann [name] M. [name] MD; [name] 11:58 AM) Patient words: HPI: The patient is a 66 year old female who presents with for consultation from Joann [name] for evaluation of possible hemorrhoids. She is a patient of Joann [name]. She has a hx of bright red blood per rectum for the last year. She notices tissue prolapse that requires manual reduction with bowel movements. No significant anal pain. She also reports bleeding with essentially every bowel movement in the toilet bowl and on the toilet paper. She takes a daily fiber supplement, Metamucil. She reports drinking at least 64 oz of H2O per day. She is no longer spending prolonged periods of time on the commode but still having issues with bleeding. She describes a history of occasional incontinence to gas as well as leakage of liquid stool. She wears a pad daily due to ongoing stool seepage when it is liquid. 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 She also has a chronic history with diarrhea which resolved once she was started on mesalamine by Joann. Loletha [name]. She underwent colonoscopy 09/06/16 which showed hemorrhoids and 2 benign polyps that were removed. Random biopsies returned normal. Denies prior anorectal procedures OB: G3P3 - all vaginal PMH: Diarrhea/colitis resolved with mesalamine. FHx: Denies FHx of malignancy Social: Denies the use of tobacco/EtOH/drugs ROS: A compreshensive 10 system review of systems was completed with the patient - pertinent positives as noted above in HPI.  The patient is a 66 year old female.   Allergies Joann [name], Joann [name] 11:16 AM) Allergies Reconciled   Medication History Joann [name], Joann [name] 11:17 AM) Procto-Med HC (2.5% Cream, 1 (one) Rectal two times daily, Taken starting 09/26/2016) Active. Viberzi (75MG Tablet, Oral) Active. Hyoscyamine Sulfate (0.125MG Tab Sublingual,  Sublingual) Active. Apriso (0.375GM Capsule ER 24HR, 3 tabs Oral daily) Active. Medications Reconciled    Review of Systems Joann [name] M. Joann Klinck MD; [name] 11:58 AM) General Not Present- Appetite Loss, Chills, Fatigue, Fever, Night Sweats, Weight Gain and Weight Loss. Skin Not Present- Change in Wart/Mole, Dryness, Hives, Jaundice, New Lesions, Non-Healing Wounds, Rash and Ulcer. HEENT Present- Ringing in the Ears and Wears glasses/contact lenses. Not Present- Earache, Hearing Loss, Hoarseness, Nose Bleed, Oral Ulcers, Seasonal Allergies, Sinus Pain, Sore Throat, Visual Disturbances and Yellow Eyes. Respiratory Not Present- Bloody sputum, Chronic Cough, Difficulty Breathing, Snoring and Wheezing. Breast Not Present- Breast Mass, Breast Pain, Nipple Discharge and Skin Changes. Cardiovascular Not Present- Chest Pain, Difficulty Breathing Lying Down, Leg Cramps, Palpitations, Rapid Heart Rate, Shortness of Breath and Swelling of Extremities. Gastrointestinal Present- Abdominal Pain, Chronic diarrhea, Constipation, Hemorrhoids, Indigestion and Rectal Pain. Not Present- Bloating, Bloody Stool, Change in Bowel Habits, Difficulty Swallowing, Excessive gas, Gets full quickly at meals, Nausea and Vomiting. Female Genitourinary Present- Frequency and Urgency. Not Present- Nocturia, Painful Urination and Pelvic Pain. Musculoskeletal Present- Back Pain. Not Present- Joint Pain, Joint Stiffness, Muscle Pain, Muscle Weakness and Swelling of Extremities. Neurological Not Present- Decreased Memory, Fainting, Headaches, Numbness, Seizures, Tingling, Tremor, Trouble walking and Weakness. Psychiatric Not Present- Anxiety, Bipolar, Change in Sleep Pattern, Depression, Fearful and Frequent crying. Endocrine Not Present- Cold Intolerance, Excessive Hunger, Hair Changes, Heat Intolerance, Hot flashes and New Diabetes. Hematology Not Present- Blood Thinners, Easy Bruising, Excessive bleeding, Gland problems, HIV  and Persistent Infections.  Vitals Joann [name]; [name] 11:16 AM) [name] 11:16 AM Weight: 188.2 lb Height: 64in Body Surface Area: 1.91 m Body Mass  Index: 32.3 kg/m  Temp.: 98.58F  Pulse: 98 (Regular)  BP: 130/72 (Sitting, Left Arm, Standard)       Physical Exam Joann [name] M. Khadijah Mastrianni MD; [name] 12:11 PM) The physical exam findings are as follows: Note:Constitutional: No acute distress, conversant, no deformities Eyes: Moist conjunctiva, no lid lag, anicteric, pupils equal round reactive to light Neck: Trachea midline; no thyromegaly Lungs: Normal respiratory effort; no tactile fremitus CV: Regular rate and rhythm, no palpable thrills, no pitting edema GI: Abdomen is soft, nontender; no palpable hepatomegaly Anorectal: DRE no palpable masses; normal tone. Anoscopy - Mixed 3 column internal+external hemorrhoids. Small nonbleeding punctuate ulcer on left lateral external component. No active bleeding. MSK: Normal gait; no clubbing/cyanosis Psych: Appropriate affect; alert and oriented 3 Lymphatics: No palpable cervical or axillary lymphadenopathy    Assessment & Plan Joann [name] M. Jessalyn Hinojosa MD; [name] 12:11 PM) INTERNAL AND EXTERNAL BLEEDING HEMORRHOIDS (K64.4) Impression: We discussed the etiology of hemorrhoids. The patient was given educational material as well as diagrams. We discussed nonoperative and operative management of hemorrhoidal disease. -Continue daily fiber supplement and mesalamine -Continue to minimze time on commode -Continue 64oz H2O per day -Given that despite above and now control of her diarrhea that her bleeding has persisted and it appears that the source is likely the external component of the left lateral bundle, I believe a limited hemorrhoidectomy of just the left lateral bundle would be indicated. I discussed that given her baseline issues with fecal incontinence, a 3 column hemorrhoidectomy will certainly make this  worse and sticking to a more limited approach would be safest. -The procedure, material risks (including but not limited to, pain, bleeding, infection, recurrence, fecal incontinence, need for additional procedures) benefits and alternatives of surgery were discussed. Her questions were answered to her satisfaction and she elected to proceed  ANOSCOPY, DIAGNOSTIC (95284) (Anorectal: DRE no palpable masses; normal tone. Anoscopy - Mixed 3 column internal+external hemorrhoids. Small nonbleeding punctuate ulcer on left lateral external component. No active bleeding.)  Sharon Mt. Dema Severin, JoannD. General and Colorectal Surgery [facility]

## 2016-11-20 ENCOUNTER — Ambulatory Visit: Payer: Self-pay | Admitting: Surgery

## 2016-11-20 ENCOUNTER — Telehealth: Payer: Self-pay | Admitting: Gastroenterology

## 2016-11-22 ENCOUNTER — Other Ambulatory Visit: Payer: Self-pay | Admitting: Gastroenterology

## 2016-11-22 NOTE — Telephone Encounter (Signed)
Left a detailed msg on voicemail. Looks like we had a duplicate request, form different pharmacies.

## 2016-11-22 NOTE — Telephone Encounter (Signed)
Joann [name] from [facility] says that they did not get voicemail. Could you call them again? Thanks. Best # [phone]

## 2016-11-22 NOTE — Telephone Encounter (Signed)
Spoke to the pharmacy at [facility]. They did not receive the refills from 11-13-2016. Verbal order has been given to fill the Viberzi 75 mg. # 60 3 rfs.

## 2016-12-19 DIAGNOSIS — K649 Unspecified hemorrhoids: Secondary | ICD-10-CM | POA: Insufficient documentation

## 2016-12-19 DIAGNOSIS — K5190 Ulcerative colitis, unspecified, without complications: Secondary | ICD-10-CM | POA: Insufficient documentation

## 2016-12-19 DIAGNOSIS — R079 Chest pain, unspecified: Secondary | ICD-10-CM | POA: Insufficient documentation

## 2016-12-22 DIAGNOSIS — R002 Palpitations: Secondary | ICD-10-CM | POA: Insufficient documentation

## 2017-01-01 DIAGNOSIS — R7303 Prediabetes: Secondary | ICD-10-CM | POA: Insufficient documentation

## 2017-01-01 DIAGNOSIS — R0602 Shortness of breath: Secondary | ICD-10-CM | POA: Insufficient documentation

## 2017-01-01 DIAGNOSIS — E785 Hyperlipidemia, unspecified: Secondary | ICD-10-CM | POA: Insufficient documentation

## 2017-01-15 ENCOUNTER — Ambulatory Visit (HOSPITAL_BASED_OUTPATIENT_CLINIC_OR_DEPARTMENT_OTHER): Admit: 2017-01-15 | Payer: BC Managed Care – PPO | Admitting: Surgery

## 2017-01-15 ENCOUNTER — Encounter (HOSPITAL_BASED_OUTPATIENT_CLINIC_OR_DEPARTMENT_OTHER): Payer: Self-pay

## 2017-01-15 SURGERY — EXAM UNDER ANESTHESIA WITH HEMORRHOIDECTOMY
Anesthesia: Monitor Anesthesia Care

## 2017-01-28 HISTORY — PX: COLONOSCOPY: SHX174

## 2017-02-19 ENCOUNTER — Telehealth (INDEPENDENT_AMBULATORY_CARE_PROVIDER_SITE_OTHER): Payer: Self-pay | Admitting: Orthopedic Surgery

## 2017-02-19 DIAGNOSIS — M545 Low back pain: Secondary | ICD-10-CM

## 2017-02-19 DIAGNOSIS — M546 Pain in thoracic spine: Secondary | ICD-10-CM

## 2017-02-19 NOTE — Telephone Encounter (Signed)
Patient had an injection with Dr. Ernestina Patches a while back and would like another one, could you send a referral for her? Patients CB # [phone]

## 2017-02-19 NOTE — Telephone Encounter (Signed)
IC advised patient ordered.

## 2017-02-19 NOTE — Addendum Note (Signed)
Addended byLaurann Montana on: 02/19/2017 03:55 PM   Modules accepted: Orders

## 2017-02-19 NOTE — Telephone Encounter (Signed)
Y pls ends

## 2017-02-19 NOTE — Telephone Encounter (Signed)
Ok for referral?

## 2017-03-28 ENCOUNTER — Ambulatory Visit (INDEPENDENT_AMBULATORY_CARE_PROVIDER_SITE_OTHER): Payer: BC Managed Care – PPO

## 2017-03-28 ENCOUNTER — Encounter (INDEPENDENT_AMBULATORY_CARE_PROVIDER_SITE_OTHER): Payer: Self-pay | Admitting: Physical Medicine and Rehabilitation

## 2017-03-28 ENCOUNTER — Ambulatory Visit (INDEPENDENT_AMBULATORY_CARE_PROVIDER_SITE_OTHER): Payer: BC Managed Care – PPO | Admitting: Physical Medicine and Rehabilitation

## 2017-03-28 VITALS — BP 115/73 | HR 81 | Temp 98.1°F

## 2017-03-28 DIAGNOSIS — M25511 Pain in right shoulder: Secondary | ICD-10-CM

## 2017-03-28 DIAGNOSIS — M25512 Pain in left shoulder: Secondary | ICD-10-CM | POA: Diagnosis not present

## 2017-03-28 DIAGNOSIS — G8929 Other chronic pain: Secondary | ICD-10-CM | POA: Diagnosis not present

## 2017-03-28 DIAGNOSIS — M419 Scoliosis, unspecified: Secondary | ICD-10-CM

## 2017-03-28 DIAGNOSIS — M542 Cervicalgia: Secondary | ICD-10-CM

## 2017-03-28 DIAGNOSIS — M546 Pain in thoracic spine: Secondary | ICD-10-CM

## 2017-03-28 DIAGNOSIS — M7918 Myalgia, other site: Secondary | ICD-10-CM | POA: Diagnosis not present

## 2017-03-28 MED ORDER — PREDNISONE 50 MG PO TABS: ORAL_TABLET | ORAL | 0 refills | Status: DC

## 2017-03-28 NOTE — Progress Notes (Signed)
Joann [name] - 67 y.o. female MRN [id number]  Date of birth: November 30, 1950  Office Visit Note: Visit Date: 03/28/2017 PCP: Vidal Schwalbe, MD Referred by: Vidal Schwalbe, MD  Subjective: Chief Complaint  Patient presents with  . Neck - Pain   HPI: Joann [name] is a very pleasant 67 year old female that I last saw in 2011 at the request of Dr. Marlou Sa she obtains her orthopedic care.  We completed an L5-S1 interlaminar epidural steroid injection on the right at that time with some relief of her lower back pain.  She recently called into Dr. Marlou Sa who she continues to follow with and he suggested that she follow-up with me for repeat injection.  Unfortunately she is having mainly pain in her neck that radiates into her shoulder blades.  She reports the pain is worse with reaching out and carrying work equipment that she has to do at work.  She reports this is been going on for 6 months without a specific injury.  She reports that tramadol and extra strength Tylenol seem to help to a degree.  She has a heating pad makes things better.  The pain she describes is across the shoulder blades and thoracic area.  She feels like the pain is not related necessarily to the neck but feels like it is more across the shoulder blades.  She has not had recent physical therapy.  She really is not able to take anti-inflammatories Duda ulcerative colitis and some other issues with her digestive tract.  She has had chronic pain for a long time both in the upper and lower segments.  She has had bilateral knee replacements.  She has not noted any difficulty breathing.  She has not had any sternal chest pain or other new chest pain.  She denies any fever chills or night sweats.  She has had this progressive ongoing pain radiating into the shoulder blade area bilaterally.  She has not had any imaging of the cervical or thoracic spine.  I did order x-rays today and those are reviewed below.   Review of Systems  Constitutional:  Negative for chills, fever, malaise/fatigue and weight loss.  HENT: Negative for hearing loss and sinus pain.   Eyes: Negative for blurred vision, double vision and photophobia.  Respiratory: Negative for cough and shortness of breath.   Cardiovascular: Negative for chest pain, palpitations and leg swelling.  Gastrointestinal: Negative for abdominal pain, nausea and vomiting.  Genitourinary: Negative for flank pain.  Musculoskeletal: Positive for back pain, joint pain and neck pain. Negative for myalgias.  Skin: Negative for itching and rash.  Neurological: Negative for tingling, tremors, focal weakness and weakness.  Endo/Heme/Allergies: Negative.   Psychiatric/Behavioral: Negative for depression.  All other systems reviewed and are negative.  Otherwise per HPI.  Assessment & Plan: Visit Diagnoses:  1. Cervicalgia   2. Chronic pain of both shoulders   3. Pain in thoracic spine   4. Myofascial pain syndrome   5. Scoliosis of thoracic spine, unspecified scoliosis type     Plan: Findings:  Chronic history of multiple joint pain in multiple levels of arthritis.  She has been having 6 months of worsening chronic neck and shoulder blade pain.  Her neck pain is likely related to arthritic changes particularly at the C4-5 and C5-6 level with increased kyphosis and pretty significant facet arthropathy.  The pain across the thoracic spine I believe to be more myofascial pain but may be related to the fact that she is having  some issues in the cervical spine.  She does have some degree of scoliosis of the thoracic spine but is fairly mild.  She could in fact be having a radicular pain which would be more of a C5 radiculitis into the shoulders and shoulder blade.  We discussed this at length.  We talked about getting an MRI of the cervical spine depending on how she was doing.  The best approach that we agreed on was prednisone for 5 days and we talked about exercises and stretching.  We talked about  regrouping with physical therapy.  The next step would be regrouping with physical therapy and possible cervical MRI.  We could also alternatively look at trigger point injections.    Meds & Orders:  Meds ordered this encounter  Medications  . predniSONE (DELTASONE) 50 MG tablet    Sig: Take 1 tablet daily with food for 5 days until finished    Dispense:  5 tablet    Refill:  0    Orders Placed This Encounter  Procedures  . XR Cervical Spine 2 or 3 views  . XR Thoracic Spine 2 View    Follow-up: Return if symptoms worsen or fail to improve.   Procedures: No procedures performed  No notes on file   Clinical History: Lumbar spine MRI dated 05/19/2009  L4-5 degenerative disc desiccation with broad bulging and left paracentral extrusion some impact in the left lateral recess.  Bilateral facet arthropathy.  At L5-S1 is more of a right foraminal extraforaminal protrusion that could affect the L5 nerve root.   She reports that she quit smoking about 26 years ago. Her smoking use included cigarettes. She has a 8.00 pack-year smoking history. She has never used smokeless tobacco. No results for input(s): HGBA1C, LABURIC in the last 8760 hours.  Objective:  VS:  HT:    WT:   BMI:     BP:115/73  HR:81bpm  TEMP:98.1 F (36.7 C)(Oral)  RESP:97 % Physical Exam  Constitutional: She is oriented to person, place, and time. She appears well-developed and well-nourished. No distress.  HENT:  Head: Normocephalic and atraumatic.  Nose: Nose normal.  Mouth/Throat: Oropharynx is clear and moist.  Eyes: Pupils are equal, round, and reactive to light. Conjunctivae and EOM are normal.  Neck: Normal range of motion. Neck supple.  Cardiovascular: Normal rate, regular rhythm and intact distal pulses.  Pulmonary/Chest: Effort normal. No respiratory distress.  Abdominal: She exhibits no distension. There is no guarding.  Musculoskeletal:  Examination of the cervical spine Associates he sits with a  forward flexed cervical spine with pain upon extension rotation left more than right.  She has a negative Spurling's test bilaterally.  She has mild shoulder impingement bilaterally more pain with external rotation.  Palpating along the cervical paraspinal musculature she has trigger points in the levator scapula and rhomboids.  She has good strength in the upper extremities bilaterally.  She has 2+ muscle stretch reflexes at the biceps and brachioradialis.  She has negative Hoffman's test bilaterally.  Neurological: She is alert and oriented to person, place, and time. She exhibits normal muscle tone. Coordination normal.  Skin: Skin is warm and dry. No rash noted. No erythema.  Psychiatric: She has a normal mood and affect. Her behavior is normal.  Nursing note and vitals reviewed.   Ortho Exam Imaging: No results found.  Past Medical/Family/Surgical/Social History: Medications & Allergies reviewed per EMR, new medications updated. Patient Active Problem List   Diagnosis Date Noted  . Hyperlipidemia  01/01/2017  . Pre-diabetes 01/01/2017  . Shortness of breath 01/01/2017  . Palpitations 12/22/2016  . Chest pain 12/19/2016  . Hemorrhoids 12/19/2016  . Ulcerative colitis ([place]) 12/19/2016   Past Medical History:  Diagnosis Date  . Arthritis    knees and lower back  . Cancer ([place])    skin cancer on nose  . Cataract   . DOE (dyspnea on exertion)   . Dyslipidemia   . GERD (gastroesophageal reflux disease)   . Hiatal hernia   . Hiatal hernia   . Total knee replacement status    x2   Family History  Problem Relation Age of Onset  . Alzheimer's disease Mother   . Colon cancer Neg Hx   . Stomach cancer Neg Hx   . Rectal cancer Neg Hx   . Esophageal cancer Neg Hx   . Liver cancer Neg Hx    Past Surgical History:  Procedure Laterality Date  . ABDOMINAL HYSTERECTOMY  1996  . arthroscopy left knee    . COLONOSCOPY  07/2011   [facility]   . FOREIGN BODY REMOVAL Right 02/27/2016    Procedure: REMOVAL FOREIGN BODY EXTREMITY;  Surgeon: Meredith Pel, MD;  Location: [facility];  Service: Orthopedics;  Laterality: Right;  . JOINT REPLACEMENT  10-2010  . KNEE ARTHROPLASTY  01/15/2011   Procedure: COMPUTER ASSISTED TOTAL KNEE ARTHROPLASTY;  Surgeon: Meredith Pel;  Location: [facility];  Service: Orthopedics;  Laterality: Right;  Right total knee arthroplasty  . NASAL SINUS SURGERY    . REPLACEMENT TOTAL KNEE BILATERAL    . right foot surgery    . TONSILLECTOMY     Social History   Occupational History  . Occupation: court reporter  Tobacco Use  . Smoking status: Former Smoker    Packs/day: 0.50    Years: 16.00    Pack years: 8.00    Types: Cigarettes    Last attempt to quit: 01/11/1991    Years since quitting: 26.4  . Smokeless tobacco: Never Used  Substance and Sexual Activity  . Alcohol use: No  . Drug use: No  . Sexual activity: Yes    Birth control/protection: Surgical

## 2017-04-09 ENCOUNTER — Other Ambulatory Visit: Payer: Self-pay | Admitting: Gastroenterology

## 2017-04-10 ENCOUNTER — Telehealth: Payer: Self-pay | Admitting: Gastroenterology

## 2017-04-10 NOTE — Telephone Encounter (Signed)
Left a message to return call.  

## 2017-04-10 NOTE — Telephone Encounter (Signed)
Refill request for Apriso 0.375 gram, take 4 caps daily po. Last seen 06-18-16 in clinic, last colon on 06-18-2016. Please advise on refills.

## 2017-04-10 NOTE — Telephone Encounter (Signed)
I will refill it for a 90 day supply.  I need to see her in next 2-3 months, please.

## 2017-04-11 NOTE — Telephone Encounter (Signed)
Incoming refill request came for Apriso. Pt needs a follow up for continued refills, per Dr Loletha Carrow.  Scheduled pt for 06-02-2017 @ 130 pm.

## 2017-04-11 NOTE — Telephone Encounter (Signed)
Pt has been scheduled for a follow up on 06-02-2017. She states the [employer] and Viberzi are working great. She also wanted you to know she did not have the hemorrhoid surgery. She did see Dr. Dema Severin at [facility], see his note under the note tab in Epic.

## 2017-04-25 ENCOUNTER — Ambulatory Visit (INDEPENDENT_AMBULATORY_CARE_PROVIDER_SITE_OTHER): Payer: Self-pay | Admitting: Physical Medicine and Rehabilitation

## 2017-05-23 ENCOUNTER — Ambulatory Visit (INDEPENDENT_AMBULATORY_CARE_PROVIDER_SITE_OTHER): Payer: Self-pay | Admitting: Physical Medicine and Rehabilitation

## 2017-06-02 ENCOUNTER — Encounter (INDEPENDENT_AMBULATORY_CARE_PROVIDER_SITE_OTHER): Payer: Self-pay | Admitting: Physical Medicine and Rehabilitation

## 2017-06-02 ENCOUNTER — Telehealth: Payer: Self-pay | Admitting: Gastroenterology

## 2017-06-02 ENCOUNTER — Ambulatory Visit: Payer: BC Managed Care – PPO | Admitting: Gastroenterology

## 2017-06-02 NOTE — Progress Notes (Signed)
   Numeric Pain Rating Scale and Functional Assessment Average Pain 6   In the last MONTH (on 0-10 scale) has pain interfered with the following?  1. General activity like being  able to carry out your everyday physical activities such as walking, climbing stairs, carrying groceries, or moving a chair?  Rating(3)

## 2017-06-02 NOTE — Progress Notes (Deleted)
     [facility] Progress Note  Chief Complaint: Chronic diarrhea  Subjective  History:  This is a 67 year old with chronic diarrhea and an IBS-like picture.  Colonoscopy and biopsies were normal.  However, she took some of her daughter's mesalamine and had significant improvement in diarrhea. The reason for this was unclear, but perhaps there was microscopic colitis not picked up with or appreciated on biopsies.  ROS: Cardiovascular:  no chest pain Respiratory: no dyspnea  The patient's Past Medical, Family and Social History were reviewed and are on file in the EMR.  Objective:  Med list reviewed  Current Outpatient Medications:  .  APRISO 0.375 g 24 hr capsule, TAKE 4 CAPSULES BY MOUTH ONCE DAILY, Disp: 360 capsule, Rfl: 0 .  aspirin EC 81 MG tablet, Take 81 mg by mouth daily., Disp: , Rfl:  .  b complex vitamins capsule, Take 1 capsule by mouth daily., Disp: , Rfl:  .  Cholecalciferol (VITAMIN D3) 5000 units TABS, Take 5,000 Units by mouth daily., Disp: , Rfl:  .  hyoscyamine (LEVSIN SL) 0.125 MG SL tablet, Place 1 tablet (0.125 mg total) under the tongue every 6 (six) hours as needed., Disp: 45 tablet, Rfl: 1 .  magnesium oxide (MAG-OX) 400 MG tablet, Take 400 mg by mouth daily., Disp: , Rfl:  .  mirabegron ER (MYRBETRIQ) 50 MG TB24 tablet, Take 50 mg by mouth., Disp: , Rfl:  .  omeprazole (PRILOSEC OTC) 20 MG tablet, Take 20 mg by mouth daily as needed (for acid reflux/hearburn.)., Disp: , Rfl:  .  pravastatin (PRAVACHOL) 20 MG tablet, Take 20 mg by mouth., Disp: , Rfl:  .  predniSONE (DELTASONE) 50 MG tablet, Take 1 tablet daily with food for 5 days until finished, Disp: 5 tablet, Rfl: 0 .  Soft Lens Products (REWETTING DROPS) SOLN, Apply 1 drop to eye daily as needed (for dry contact lenses)., Disp: , Rfl:  .  traMADol (ULTRAM) 50 MG tablet, Take 1 tablet (50 mg total) by mouth every 6 (six) hours as needed. (Patient taking differently: Take 50 mg by mouth as needed  (for pain.). ), Disp: 30 tablet, Rfl: 0 .  VIBERZI 75 MG TABS, TAKE 1 TABLET BY MOUTH TWICE DAILY, Disp: 60 tablet, Rfl: 2  Current Facility-Administered Medications:  .  0.9 %  sodium chloride infusion, 500 mL, Intravenous, Continuous, Danis, Estill Cotta III, MD   Vital signs in last 24 hrs: There were no vitals filed for this visit.  Physical Exam  ***  HEENT: sclera anicteric, oral mucosa moist without lesions  Neck: supple, no thyromegaly, JVD or lymphadenopathy  Cardiac: RRR without murmurs, S1S2 heard, no peripheral edema  Pulm: clear to auscultation bilaterally, normal RR and effort noted  Abdomen: soft, *** tenderness, with active bowel sounds. No guarding or palpable hepatosplenomegaly.  Skin; warm and dry, no jaundice or rash  Recent Labs:    Radiologic studies:    @ASSESSMENTPLANBEGIN @ Assessment: No diagnosis found.    Plan:    Total time *** minutes, over half spent face-to-face with patient in counseling and coordination of care.   Nelida Meuse III

## 2017-06-16 ENCOUNTER — Other Ambulatory Visit: Payer: Self-pay | Admitting: Gastroenterology

## 2017-06-16 NOTE — Telephone Encounter (Signed)
Refill request for Viberzi 75 mg. Follow up scheduled for 07-10-2017.

## 2017-07-10 ENCOUNTER — Encounter (INDEPENDENT_AMBULATORY_CARE_PROVIDER_SITE_OTHER): Payer: Self-pay

## 2017-07-10 ENCOUNTER — Encounter: Payer: Self-pay | Admitting: Gastroenterology

## 2017-07-10 ENCOUNTER — Ambulatory Visit: Payer: BC Managed Care – PPO | Admitting: Gastroenterology

## 2017-07-10 VITALS — BP 114/70 | HR 76 | Ht 62.25 in | Wt 192.1 lb

## 2017-07-10 DIAGNOSIS — K648 Other hemorrhoids: Secondary | ICD-10-CM

## 2017-07-10 DIAGNOSIS — F458 Other somatoform disorders: Secondary | ICD-10-CM

## 2017-07-10 DIAGNOSIS — K580 Irritable bowel syndrome with diarrhea: Secondary | ICD-10-CM | POA: Diagnosis not present

## 2017-07-10 DIAGNOSIS — K219 Gastro-esophageal reflux disease without esophagitis: Secondary | ICD-10-CM | POA: Diagnosis not present

## 2017-07-10 DIAGNOSIS — R0989 Other specified symptoms and signs involving the circulatory and respiratory systems: Secondary | ICD-10-CM

## 2017-07-10 DIAGNOSIS — R1313 Dysphagia, pharyngeal phase: Secondary | ICD-10-CM | POA: Diagnosis not present

## 2017-07-10 MED ORDER — HYDROCORTISONE 2.5 % RE CREA: 1.0000 "application " | TOPICAL_CREAM | Freq: Two times a day (BID) | RECTAL | 1 refills | Status: DC | PRN

## 2017-07-10 NOTE — Progress Notes (Signed)
[facility] Progress Note  Chief Complaint: Hemorrhoidal bleeding and chronic diarrhea  Subjective  History:  Joann [name] follows up for her IBS and hemorrhoidal bleeding.  Last year after colonoscopy and biopsies did not show overt or microscopic colitis, she continued using Viberzi once daily.  Twice daily seems to cause constipation.  She was bothered by urgency and frequency for bowel movements.  She then took some of her daughter's mesalamine, and reports that her diarrhea was significantly improved.  I thought perhaps she had mild microscopic colitis not appreciated on biopsies, or perhaps the condition was patchy, and since this medicine is low risk, I prescribed her Apriso 4 capsules a day.  She reports significant improvement with diarrhea, still some episodes controlled with Viberzi.  However, she is bothered by persistent prolapsing and bleeding internal hemorrhoids.  It was grade 3 when I last examined them.  I did not feel they were likely amenable to banding due to the degree and also poor anal sphincter tone.  She saw Dr. Greer Pickerel, and was planned for a hemorrhoidectomy in December.  She ultimately canceled this out of concern for the postoperative pain and possible fecal incontinence that might result.  She has been troubled by persistent prolapsing and bleeding hemorrhoids made worse during periods of stress  that caused diarrhea.  Overall, her appetite is good and her weight stable.  ROS: Cardiovascular:  no chest pain Respiratory: no dyspnea She also reports years of reflux symptoms that seem worse in the last couple of months with feeling of fullness or lump in the throat and difficulty swallowing at times.  She has had a left-sided sinus surgery and chronic postnasal drip afterwards.  She denies vomiting, odynophagia or anorexia.  Remainder of systems negative except as above  The patient's Past Medical, Family and Social History were reviewed and are on file in the  EMR.  Objective:  Med list reviewed  Current Outpatient Medications:  .  APRISO 0.375 g 24 hr capsule, TAKE 4 CAPSULES BY MOUTH ONCE DAILY, Disp: 360 capsule, Rfl: 0 .  b complex vitamins capsule, Take 1 capsule by mouth daily., Disp: , Rfl:  .  Cholecalciferol (VITAMIN D3) 5000 units TABS, Take 5,000 Units by mouth daily., Disp: , Rfl:  .  hyoscyamine (LEVSIN SL) 0.125 MG SL tablet, Place 1 tablet (0.125 mg total) under the tongue every 6 (six) hours as needed., Disp: 45 tablet, Rfl: 1 .  magnesium oxide (MAG-OX) 400 MG tablet, Take 400 mg by mouth daily., Disp: , Rfl:  .  mirabegron ER (MYRBETRIQ) 50 MG TB24 tablet, Take 50 mg by mouth., Disp: , Rfl:  .  omeprazole (PRILOSEC OTC) 20 MG tablet, Take 20 mg by mouth daily as needed (for acid reflux/hearburn.)., Disp: , Rfl:  .  pravastatin (PRAVACHOL) 20 MG tablet, Take 20 mg by mouth., Disp: , Rfl:  .  Soft Lens Products (REWETTING DROPS) SOLN, Apply 1 drop to eye daily as needed (for dry contact lenses)., Disp: , Rfl:  .  VIBERZI 75 MG TABS, TAKE 1 TABLET BY MOUTH TWICE DAILY, Disp: 60 tablet, Rfl: 0 .  hydrocortisone (ANUSOL-HC) 2.5 % rectal cream, Place 1 application rectally 2 (two) times daily as needed for hemorrhoids or anal itching., Disp: 30 g, Rfl: 1  Current Facility-Administered Medications:  .  0.9 %  sodium chloride infusion, 500 mL, Intravenous, Continuous, Danis, Estill Cotta III, MD   Vital signs in last 24 hrs: Vitals:   07/10/17 0831  BP: 114/70  Pulse: 76    Physical Exam  She is well-appearing  HEENT: sclera anicteric, oral mucosa moist without lesions  Neck: supple, no thyromegaly, JVD or lymphadenopathy  Cardiac: RRR without murmurs, S1S2 heard, no peripheral edema  Pulm: clear to auscultation bilaterally, normal RR and effort noted  Abdomen: soft, no tenderness, with active bowel sounds. No guarding or palpable hepatosplenomegaly.  Skin; warm and dry, no jaundice or rash Rectal grade 3 hemorrhoids,  prolapse and inflamed but able to be reduced manually with tenderness.  No fissure or internal masses appreciated   @ASSESSMENTPLANBEGIN @ Assessment: Encounter Diagnoses  Name Primary?  . Gastroesophageal reflux disease, esophagitis presence not specified Yes  . Pharyngeal dysphagia   . Globus sensation   . Irritable bowel syndrome with diarrhea   . Bleeding internal hemorrhoids     Long-standing GERD with suboptimal symptom control, sensation of dysphagia and globus.  At least part of that may be related to ENT issues nevertheless, I advised her to have upper endoscopy to rule out mechanical cause of dysphagia and perform dilation if appropriate.  She is still very troubled by grade 3 internal hemorrhoids causing pain and bleeding.  I have asked for her to be evaluated by colorectal surgery at the [facility] practice.  It might still be possible for banding to be performed by colorectal surgery.  Perhaps injection or some sclerotherapy may be offered before surgical hemorrhoidectomy.  I have continued the mesalamine for suspected microscopic colitis, I think the majority of symptoms are from IBS.   Total time 30 minutes, over half spent face-to-face with patient in counseling and coordination of care.   Nelida Meuse III

## 2017-07-10 NOTE — Patient Instructions (Signed)
If you are age 69 or older, your body mass index should be between 23-30. Your Body mass index is 34.86 kg/m. If this is out of the aforementioned range listed, please consider follow up with your Primary Care Provider.  If you are age 63 or younger, your body mass index should be between 19-25. Your Body mass index is 34.86 kg/m. If this is out of the aformentioned range listed, please consider follow up with your Primary Care Provider.   You have been scheduled for an endoscopy. Please follow written instructions given to you at your visit today. If you use inhalers (even only as needed), please bring them with you on the day of your procedure. Your physician has requested that you go to www.startemmi.com and enter the access code given to you at your visit today. This web site gives a general overview about your procedure. However, you should still follow specific instructions given to you by our office regarding your preparation for the procedure.  It was a pleasure to see you today!  Dr. Loletha Carrow

## 2017-07-17 ENCOUNTER — Ambulatory Visit (AMBULATORY_SURGERY_CENTER): Payer: BC Managed Care – PPO | Admitting: Gastroenterology

## 2017-07-17 ENCOUNTER — Encounter: Payer: Self-pay | Admitting: Gastroenterology

## 2017-07-17 ENCOUNTER — Other Ambulatory Visit: Payer: Self-pay

## 2017-07-17 VITALS — BP 112/69 | HR 68 | Temp 98.9°F | Resp 12 | Ht 62.0 in | Wt 192.0 lb

## 2017-07-17 DIAGNOSIS — R1314 Dysphagia, pharyngoesophageal phase: Secondary | ICD-10-CM

## 2017-07-17 DIAGNOSIS — K219 Gastro-esophageal reflux disease without esophagitis: Secondary | ICD-10-CM

## 2017-07-17 MED ORDER — SODIUM CHLORIDE 0.9 % IV SOLN: 500.0000 mL | Freq: Once | INTRAVENOUS | Status: DC

## 2017-07-17 NOTE — Progress Notes (Signed)
Report given to PACU, vss 

## 2017-07-17 NOTE — Patient Instructions (Signed)
YOU HAD AN ENDOSCOPIC PROCEDURE TODAY AT THE [facility]:   Refer to the procedure report that was given to you for any specific questions about what was found during the examination.  If the procedure report does not answer your questions, please call your gastroenterologist to clarify.  If you requested that your care partner not be given the details of your procedure findings, then the procedure report has been included in a sealed envelope for you to review at your convenience later.  YOU SHOULD EXPECT: Some feelings of bloating in the abdomen. Passage of more gas than usual.  Walking can help get rid of the air that was put into your GI tract during the procedure and reduce the bloating. If you had a lower endoscopy (such as a colonoscopy or flexible sigmoidoscopy) you may notice spotting of blood in your stool or on the toilet paper. If you underwent a bowel prep for your procedure, you may not have a normal bowel movement for a few days.  Please Note:  You might notice some irritation and congestion in your nose or some drainage.  This is from the oxygen used during your procedure.  There is no need for concern and it should clear up in a day or so.  SYMPTOMS TO REPORT IMMEDIATELY:   Following upper endoscopy (EGD)  Vomiting of blood or coffee ground material  New chest pain or pain under the shoulder blades  Painful or persistently difficult swallowing  New shortness of breath  Fever of 100F or higher  Black, tarry-looking stools  For urgent or emergent issues, a gastroenterologist can be reached at any hour by calling [phone].   DIET:  We do recommend a small meal at first, but then you may proceed to your regular diet.  Drink plenty of fluids but you should avoid alcoholic beverages for 24 hours.  ACTIVITY:  You should plan to take it easy for the rest of today and you should NOT DRIVE or use heavy machinery until tomorrow (because of the sedation medicines used  during the test).    FOLLOW UP: Our staff will call the number listed on your records the next business day following your procedure to check on you and address any questions or concerns that you may have regarding the information given to you following your procedure. If we do not reach you, we will leave a message.  However, if you are feeling well and you are not experiencing any problems, there is no need to return our call.  We will assume that you have returned to your regular daily activities without incident.  If any biopsies were taken you will be contacted by phone or by letter within the next 1-3 weeks.  Please call us at [phone] if you have not heard about the biopsies in 3 weeks.    SIGNATURES/CONFIDENTIALITY: You and/or your care partner have signed paperwork which will be entered into your electronic medical record.  These signatures attest to the fact that that the information above on your After Visit Summary has been reviewed and is understood.  Full responsibility of the confidentiality of this discharge information lies with you and/or your care-partner. 

## 2017-07-17 NOTE — Progress Notes (Signed)
Pt's states no medical or surgical changes since previsit or office visit. 

## 2017-07-17 NOTE — Op Note (Signed)
[facility] Patient Name: Joann [name] Procedure Date: 07/17/2017 11:28 AM MRN: [id number] Endoscopist: Mallie Mussel L. Loletha Carrow , MD Age: 67 Referring MD:  Date of Birth: 16-Oct-1950 Gender: Female Account #: [id number] Procedure:                Upper GI endoscopy Indications:              Pharyngeal phase dysphagia, Heartburn, Globus                            sensation Medicines:                Monitored Anesthesia Care Procedure:                Pre-Anesthesia Assessment:                           - Prior to the procedure, a History and Physical                            was performed, and patient medications and                            allergies were reviewed. The patient's tolerance of                            previous anesthesia was also reviewed. The risks                            and benefits of the procedure and the sedation                            options and risks were discussed with the patient.                            All questions were answered, and informed consent                            was obtained. Prior Anticoagulants: The patient has                            taken no previous anticoagulant or antiplatelet                            agents. ASA Grade Assessment: II - A patient with                            mild systemic disease. After reviewing the risks                            and benefits, the patient was deemed in                            satisfactory condition to undergo the procedure.  After obtaining informed consent, the endoscope was                            passed under direct vision. Throughout the                            procedure, the patient's blood pressure, pulse, and                            oxygen saturations were monitored continuously. The                            Endoscope was introduced through the mouth, and                            advanced to the second part of duodenum. The upper                             GI endoscopy was accomplished without difficulty.                            The patient tolerated the procedure well. Scope In: Scope Out: Findings:                 The larynx was normal.                           The esophagus was normal.                           The stomach was normal.                           The cardia and gastric fundus were normal on                            retroflexion.                           The examined duodenum was normal. Complications:            No immediate complications. Estimated Blood Loss:     Estimated blood loss: none. Impression:               - Normal larynx.                           - Normal esophagus.                           - Normal stomach.                           - Normal examined duodenum.                           - No specimens collected. Recommendation:           - Patient has a contact  number available for                            emergencies. The signs and symptoms of potential                            delayed complications were discussed with the                            patient. Return to normal activities tomorrow.                            Written discharge instructions were provided to the                            patient.                           - Resume previous diet.                           - Continue present medications.                           - Attention to sinus condition and post-nasal drip                            likely to improve throat symptoms. Henry L. Loletha Carrow, MD 07/17/2017 11:45:39 AM This report has been signed electronically.

## 2017-07-18 ENCOUNTER — Other Ambulatory Visit: Payer: Self-pay | Admitting: Gastroenterology

## 2017-07-18 ENCOUNTER — Telehealth: Payer: Self-pay | Admitting: *Deleted

## 2017-07-18 NOTE — Telephone Encounter (Signed)
Left message on f/u call 

## 2017-07-18 NOTE — Telephone Encounter (Signed)
  Follow up Call-  Call back number 07/17/2017 09/06/2016  Post procedure Call Back phone  # [phone]  Permission to leave phone message Yes Yes  Some recent data might be hidden     Patient questions:  Message left to call us if necessary.

## 2017-07-21 ENCOUNTER — Telehealth: Payer: Self-pay

## 2017-07-21 NOTE — Telephone Encounter (Signed)
[facility] referral has been scheduled for 08-04-2017

## 2017-07-25 ENCOUNTER — Ambulatory Visit: Payer: Self-pay | Admitting: General Surgery

## 2017-07-25 NOTE — H&P (Signed)
History of Present Illness Joann Ruff MD; 08/22/2033 11:00 AM) The patient is a 67 year old female who presents with hemorrhoids. She is referred by Dr Loletha Carrow for bleeding hemorrhoids. She has seen to my other partners before both recommended hemorrhoidectomy. She reports that she has had chronic problems with diarrhea. She states that she has irritable bowel syndrome features with diarrhea. She has been on medications for the diarrhea and this has improved. She is now having semisolid bowel movements twice a day. She is taking supplemental fiber. She does drink plenty of water. Biggest issue is bleeding after BM's. She also has some issues with hemorrhoidal pain a couple times a week. She denies any prior rectal procedures. She reports fecal leakage on a daily basis. She has had a couple episodes of full incontinence. She had a recent colonoscopy which showed diverticulosis and a few benign polyps as well as hemorrhoids.   Problem List/Past Medical Joann Ruff, MD; 5/97/4163 11:00 AM) INTERNAL AND EXTERNAL BLEEDING HEMORRHOIDS (K64.4, K64.8)  Past Surgical History Joann Ruff, MD; 8/45/3646 11:00 AM) Breast Augmentation Bilateral. Breast Biopsy Left. Breast Mass; Local Excision Left. Colon Polyp Removal - Colonoscopy Foot Surgery Right. Hysterectomy (not due to cancer) - Complete Knee Surgery Bilateral. Oral Surgery Tonsillectomy  Diagnostic Studies History Joann Ruff, MD; 08/30/2120 11:00 AM) Colonoscopy within last year Mammogram 1-3 years ago Pap Smear >5 years ago  Allergies Joann Ruff, MD; 4/82/5003 11:00 AM) No Known Drug Allergies [09/26/2016]:  Medication History (Armen Ferguson, CMA; 07/25/2017 10:37 AM) Pravastatin Sodium (20MG Tablet, Oral) Active. Procto-Med HC (2.5% Cream, 1 (one) Rectal two times daily, Taken starting 09/26/2016) Active. TraMADol HCl (50MG Tablet, Oral as needed) Active. Apriso (0.375GM Capsule ER 24HR, 3 tabs  Oral daily) Active. Viberzi (75MG Tablet, Oral) Active. Hyoscyamine Sulfate (0.125MG Tab Sublingual, Sublingual) Active. Medications Reconciled  Social History Joann Ruff, MD; 08/01/8887 11:00 AM) Alcohol use Remotely quit alcohol use. Caffeine use Coffee. No drug use Tobacco use Former smoker.  Family History Joann Ruff, MD; 1/69/4503 11:00 AM) Arthritis Mother. Respiratory Condition Mother.  Pregnancy / Birth History Joann Ruff, MD; 8/88/2800 11:00 AM) Age at menarche 57 years. Age of menopause 3-50 Contraceptive History Oral contraceptives. Gravida 3 Irregular periods Maternal age 43-20 Para 3  Other Problems Joann Ruff, MD; 3/49/1791 11:00 AM) Back Pain Bladder Problems Gastroesophageal Reflux Disease Hemorrhoids Melanoma Oophorectomy Bilateral. Other disease, cancer, significant illness     Review of Systems Joann Ruff MD; 06/01/6977 11:00 AM) General Not Present- Appetite Loss, Chills, Fatigue, Fever, Night Sweats, Weight Gain and Weight Loss. Skin Not Present- Change in Wart/Mole, Dryness, Hives, Jaundice, New Lesions, Non-Healing Wounds, Rash and Ulcer. HEENT Present- Ringing in the Ears and Wears glasses/contact lenses. Not Present- Earache, Hearing Loss, Hoarseness, Nose Bleed, Oral Ulcers, Seasonal Allergies, Sinus Pain, Sore Throat, Visual Disturbances and Yellow Eyes. Respiratory Not Present- Bloody sputum, Chronic Cough, Difficulty Breathing, Snoring and Wheezing. Breast Not Present- Breast Mass, Breast Pain, Nipple Discharge and Skin Changes. Cardiovascular Not Present- Chest Pain, Difficulty Breathing Lying Down, Leg Cramps, Palpitations, Rapid Heart Rate, Shortness of Breath and Swelling of Extremities. Gastrointestinal Present- Abdominal Pain, Chronic diarrhea, Constipation, Hemorrhoids, Indigestion and Rectal Pain. Not Present- Bloating, Bloody Stool, Change in Bowel Habits, Difficulty Swallowing, Excessive gas,  Gets full quickly at meals, Nausea and Vomiting. Female Genitourinary Present- Frequency and Urgency. Not Present- Nocturia, Painful Urination and Pelvic Pain. Musculoskeletal Present- Back Pain. Not Present- Joint Pain, Joint Stiffness, Muscle Pain, Muscle Weakness and Swelling of Extremities. Neurological Not Present-  Decreased Memory, Fainting, Headaches, Numbness, Seizures, Tingling, Tremor, Trouble walking and Weakness. Psychiatric Not Present- Anxiety, Bipolar, Change in Sleep Pattern, Depression, Fearful and Frequent crying. Endocrine Not Present- Cold Intolerance, Excessive Hunger, Hair Changes, Heat Intolerance, Hot flashes and New Diabetes. Hematology Not Present- Blood Thinners, Easy Bruising, Excessive bleeding, Gland problems, HIV and Persistent Infections.  Vitals (Armen Ferguson CMA; 07/25/2017 10:37 AM) 07/25/2017 10:36 AM Weight: 190 lb Height: 64in Body Surface Area: 1.91 m Body Mass Index: 32.61 kg/m  Temp.: 98.75F  Pulse: 94 (Regular)  P.OX: 96% (Room air) BP: 128/78 (Sitting, Left Arm, Standard)      Physical Exam Joann Ruff MD; 03/01/3341 10:57 AM)  The physical exam findings are as follows: Note:Constitutional: No acute distress, conversant, no deformities Eyes: Moist conjunctiva, no lid lag, anicteric, pupils equal round reactive to light Neck: Trachea midline; no thyromegaly Lungs: Normal respiratory effort; no tactile fremitus CV: Regular rate and rhythm, no palpable thrills, no pitting edema GI: Abdomen is soft, nontender; no palpable hepatomegaly Anorectal: DRE no palpable masses; mildly decreaed tone. MSK: Normal gait; no clubbing/cyanosis Psych: Appropriate affect; alert and oriented 3 Lymphatics: No palpable cervical or axillary lymphadenopathy   Results Joann Ruff MD; 5/68/6168 10:58 AM) Procedures  Name Value Date ANOSCOPY, DIAGNOSTIC (37290) [ Hemorrhoids ] Procedure Other: Procedure: Anoscopy....Marland KitchenMarland KitchenSurgeon:  Marcello Moores....Marland KitchenMarland KitchenAfter the risks and benefits were explained, verbal consent was obtained for above procedure. A medical assistant chaperone was present thoroughout the entire procedure. ....Marland KitchenMarland KitchenAnesthesia: none....Marland KitchenMarland KitchenDiagnosis: rectal bleeding....Marland KitchenMarland KitchenFindings: Grade 3 left lateral hemorrhoid with stigmata bleeding, easily reduced. Grade 2 right anterior and right posterior internal hemorrhoids with mild to moderate inflammation.  Performed: 07/25/2017 10:57 AM    Assessment & Plan Joann Ruff MD; 03/10/1550 10:57 AM)  PROLAPSED INTERNAL HEMORRHOIDS, GRADE 3 (C80.2) Impression: 67 year old female with a history of chronic diarrhea and rectal bleeding. She is now on medications to control her diarrhea but continues to have rectal bleeding. She has had a colonoscopy which was negative for any other source for bleeding. On exam she has a grade 3 left lateral internal hemorrhoid. She has grade 2 hemorrhoids on the right side. It is rather obvious that her bleeding is mostly coming from her grade 3 hemorrhoid. Due to significant scarring on this side, I do not think she is a good rubber band ligation candidate. I have recommended hemorrhoidectomy. We have discussed this in detail. We discussed postoperative pain and bleeding. We discussed that hemorrhoidectomy does have the potential to make her fecal leakage worse but in my experience this to the time it gets better due to the lack of chronic prolapse. She has agreed to proceed with surgery. All questions were answered. We also discussed reducing the hemorrhoid on a daily basis to avoid further episodes of bleeding until she undergoes surgery.

## 2017-08-20 ENCOUNTER — Telehealth: Payer: Self-pay | Admitting: Gastroenterology

## 2017-08-20 NOTE — Telephone Encounter (Signed)
Routed to Dr. Loletha Carrow.

## 2017-08-20 NOTE — Telephone Encounter (Signed)
APP is a good idea. It was not our ED since no records in [place]. Would be helpful to know where that was and if records can be obtained. And then we will see if HIDA is what she needs.

## 2017-08-21 ENCOUNTER — Other Ambulatory Visit (INDEPENDENT_AMBULATORY_CARE_PROVIDER_SITE_OTHER): Payer: BC Managed Care – PPO

## 2017-08-21 ENCOUNTER — Encounter: Payer: Self-pay | Admitting: Gastroenterology

## 2017-08-21 ENCOUNTER — Ambulatory Visit: Payer: BC Managed Care – PPO | Admitting: Gastroenterology

## 2017-08-21 VITALS — BP 126/80 | HR 90 | Ht 63.0 in | Wt 186.4 lb

## 2017-08-21 DIAGNOSIS — R1011 Right upper quadrant pain: Secondary | ICD-10-CM | POA: Diagnosis not present

## 2017-08-21 DIAGNOSIS — R110 Nausea: Secondary | ICD-10-CM

## 2017-08-21 LAB — BASIC METABOLIC PANEL
BUN: 10 mg/dL (ref 6–23)
CALCIUM: 10.4 mg/dL (ref 8.4–10.5)
CO2: 27 mEq/L (ref 19–32)
CREATININE: 0.65 mg/dL (ref 0.40–1.20)
Chloride: 100 mEq/L (ref 96–112)
GFR: 96.01 mL/min (ref >60.00)
Glucose, Bld: 104 mg/dL — ABNORMAL HIGH (ref 70–99)
Potassium: 4 mEq/L (ref 3.5–5.1)
Sodium: 138 mEq/L (ref 135–145)

## 2017-08-21 NOTE — Progress Notes (Signed)
08/21/2017 Joann [name] [id number] 1950-06-29   HISTORY OF PRESENT ILLNESS: This is a 67 year old female who is a patient of Dr. Loletha Carrow.  She presents her office today with complaints of right upper quadrant abdominal pain.  She tells me that this pain began on Monday and has been persistent since that time.  Pain is severe.  Never had pain similar to this in the past.  Pain is not necessarily affected by eating, but she has not had much of an appetite.  She reports nausea, but no vomiting.  She was in the ER on 7/23 in [place], [place] where they apparently performed an ultrasound and some labs.  Apparently the ultrasound was normal and she was told that she needed a HIDA scan.  She is taking pain medication and Zofran for nausea, but has not been taking any of her other medications for now.  We are awaiting records from [facility].  EGD in 06/2017 was normal.      Past Medical History:  Diagnosis Date  . Arthritis    knees and lower back  . Cancer ([place])    skin cancer on nose  . Cataract   . DOE (dyspnea on exertion)   . Dyslipidemia   . GERD (gastroesophageal reflux disease)   . Hiatal hernia   . Hiatal hernia   . Total knee replacement status    x2   Past Surgical History:  Procedure Laterality Date  . ABDOMINAL HYSTERECTOMY  1996  . arthroscopy left knee    . COLONOSCOPY  07/2011   [facility]   . FOREIGN BODY REMOVAL Right 02/27/2016   Procedure: REMOVAL FOREIGN BODY EXTREMITY;  Surgeon: Meredith Pel, MD;  Location: [facility];  Service: Orthopedics;  Laterality: Right;  . JOINT REPLACEMENT  10-2010  . KNEE ARTHROPLASTY  01/15/2011   Procedure: COMPUTER ASSISTED TOTAL KNEE ARTHROPLASTY;  Surgeon: Meredith Pel;  Location: [facility];  Service: Orthopedics;  Laterality: Right;  Right total knee arthroplasty  . NASAL SINUS SURGERY    . REPLACEMENT TOTAL KNEE BILATERAL    . right foot surgery    . TONSILLECTOMY      reports that she quit smoking about 26  years ago. Her smoking use included cigarettes. She has a 8.00 pack-year smoking history. She has never used smokeless tobacco. She reports that she does not drink alcohol or use drugs. family history includes Alzheimer's disease in her mother. Allergies  Allergen Reactions  . No Known Allergies       Outpatient Encounter Medications as of 08/21/2017  Medication Sig  . acetaminophen (TYLENOL) 500 MG tablet Take 500 mg by mouth every 6 (six) hours as needed.  . APRISO 0.375 g 24 hr capsule TAKE 4 CAPSULES BY MOUTH ONCE DAILY  . b complex vitamins capsule Take 1 capsule by mouth daily.  . Cholecalciferol (VITAMIN D3) 5000 units TABS Take 5,000 Units by mouth daily.  Marland Kitchen HYDROcodone-acetaminophen (NORCO) 10-325 MG tablet Take 1 tablet by mouth every 6 (six) hours as needed (pt said she takes one at night).  . hydrocortisone (ANUSOL-HC) 2.5 % rectal cream Place 1 application rectally 2 (two) times daily as needed for hemorrhoids or anal itching.  . hyoscyamine (LEVSIN SL) 0.125 MG SL tablet Place 1 tablet (0.125 mg total) under the tongue every 6 (six) hours as needed.  . magnesium oxide (MAG-OX) 400 MG tablet Take 400 mg by mouth daily.  . mirabegron ER (MYRBETRIQ) 50 MG TB24 tablet Take 50  mg by mouth.  Marland Kitchen omeprazole (PRILOSEC OTC) 20 MG tablet Take 20 mg by mouth daily as needed (for acid reflux/hearburn.).  Marland Kitchen Pantoprazole Sodium (PROTONIX PO) Take by mouth.  . pravastatin (PRAVACHOL) 20 MG tablet Take 20 mg by mouth.  . Soft Lens Products (REWETTING DROPS) SOLN Apply 1 drop to eye daily as needed (for dry contact lenses).  . TraMADol HCl 150 MG CP24 Take 1 tablet by mouth as needed.  Marland Kitchen VIBERZI 75 MG TABS TAKE 1 TABLET BY MOUTH TWICE DAILY  . [DISCONTINUED] omeprazole (PRILOSEC) 20 MG capsule Take 20 mg by mouth as needed.  . [DISCONTINUED] Acetaminophen (TYLENOL) 325 MG CAPS Take 2 tablets by mouth as needed.   Facility-Administered Encounter Medications as of 08/21/2017  Medication  . 0.9 %   sodium chloride infusion  . 0.9 %  sodium chloride infusion     REVIEW OF SYSTEMS  : All other systems reviewed and negative except where noted in the History of Present Illness.   PHYSICAL EXAM: BP 126/80   Pulse 90   Ht 5' 3"  (1.6 m)   Wt 186 lb 6 oz (84.5 kg)   BMI 33.01 kg/m  General: Well developed white female; appears uncomfortable, holding her right side. Head: Normocephalic and atraumatic Eyes:  Sclerae anicteric, conjunctiva pink. Ears: Normal auditory acuity Lungs: Clear throughout to auscultation; no increased WOB. Heart: Regular rate and rhythm; no M/R/G. Abdomen: Soft, non-distended.  BS present.  Moderate RUQ TTP. Musculoskeletal: Symmetrical with no gross deformities  Skin: No lesions on visible extremities Extremities: No edema  Neurological: Alert oriented x 4, grossly non-focal Psychological:  Alert and cooperative. Normal mood and affect  ASSESSMENT AND PLAN: *RUQ abdominal pain:  I am not convinced that this is biliary dysfunction as the pain has been persistent, unrelenting since Monday.  We are awaiting records from [facility].  I am going to schedule her for a CT scan of the abdomen pelvis with contrast for tomorrow.   CC:  Vidal Schwalbe, MD

## 2017-08-21 NOTE — Patient Instructions (Addendum)
You have been scheduled for a CT scan of the abdomen and pelvis at [facility] ([address]---this is in the same building as [facility]).   You are scheduled on 08-22-17 at 11:15 am. You should arrive 15 minutes prior to your appointment time for registration. Please follow the written instructions below on the day of your exam:  WARNING: IF YOU ARE ALLERGIC TO IODINE/X-RAY DYE, PLEASE NOTIFY RADIOLOGY IMMEDIATELY AT [phone]! YOU WILL BE GIVEN A 13 HOUR PREMEDICATION PREP.  1) Do not eat or drink anything after 7:15 am (4 hours prior to your test) 2) You have been given 2 bottles of oral contrast to drink. The solution may taste better if refrigerated, but do NOT add ice or any other liquid to this solution. Shake well before drinking.    Drink 1 bottle of contrast @ 9:15 am (2 hours prior to your exam)  Drink 1 bottle of contrast @ 10:15 am (1 hour prior to your exam)  You may take any medications as prescribed with a small amount of water except for the following: Metformin, Glucophage, Glucovance, Avandamet, Riomet, Fortamet, Actoplus Met, Janumet, Glumetza or Metaglip. The above medications must be held the day of the exam AND 48 hours after the exam.  The purpose of you drinking the oral contrast is to aid in the visualization of your intestinal tract. The contrast solution may cause some diarrhea. Before your exam is started, you will be given a small amount of fluid to drink. Depending on your individual set of symptoms, you may also receive an intravenous injection of x-ray contrast/dye. Plan on being at [facility] for 30 minutes or longer, depending on the type of exam you are having performed.  This test typically takes 30-45 minutes to complete.  If you have any questions regarding your exam or if you need to reschedule, you may call the CT department at [phone] between the hours of 8:00 am and 5:00 pm,  Monday-Friday.  ________________________________________________________________________

## 2017-08-21 NOTE — Progress Notes (Signed)
Thank you for sending this case to me. I have reviewed the entire note, and the outlined plan seems appropriate.  Yes, sound unclear if it is biliary dyskinesia.  Unrelenting pain with normal ultrasound not clearly gallbladder in origin. Perhaps unrelated, but she has an underlying functional bowel disorder as well (outlined in prior office notes). CT scan a good idea, further plan to follow.  Wilfrid Lund, MD

## 2017-08-22 ENCOUNTER — Ambulatory Visit (INDEPENDENT_AMBULATORY_CARE_PROVIDER_SITE_OTHER): Payer: BC Managed Care – PPO | Admitting: Orthopedic Surgery

## 2017-08-22 ENCOUNTER — Ambulatory Visit (INDEPENDENT_AMBULATORY_CARE_PROVIDER_SITE_OTHER)
Admission: RE | Admit: 2017-08-22 | Discharge: 2017-08-22 | Disposition: A | Discharge status: Home / Self Care | Payer: BC Managed Care – PPO | Source: Ambulatory Visit | Attending: Gastroenterology | Admitting: Gastroenterology

## 2017-08-22 ENCOUNTER — Encounter (INDEPENDENT_AMBULATORY_CARE_PROVIDER_SITE_OTHER): Payer: Self-pay | Admitting: Orthopedic Surgery

## 2017-08-22 DIAGNOSIS — R1011 Right upper quadrant pain: Secondary | ICD-10-CM | POA: Diagnosis not present

## 2017-08-22 DIAGNOSIS — M65341 Trigger finger, right ring finger: Secondary | ICD-10-CM

## 2017-08-22 DIAGNOSIS — M65331 Trigger finger, right middle finger: Secondary | ICD-10-CM | POA: Diagnosis not present

## 2017-08-22 MED ORDER — IOPAMIDOL (ISOVUE-300) INJECTION 61%
100.0000 mL | Freq: Once | INTRAVENOUS | Status: AC | PRN
  Administered 2017-08-22: 100 mL via INTRAVENOUS

## 2017-08-22 MED ORDER — BUPIVACAINE HCL 0.25 % IJ SOLN
0.3300 mL | INTRAMUSCULAR | Status: AC | PRN
  Administered 2017-08-22: .33 mL

## 2017-08-22 MED ORDER — LIDOCAINE HCL 1 % IJ SOLN
3.0000 mL | INTRAMUSCULAR | Status: AC | PRN
  Administered 2017-08-22: 3 mL

## 2017-08-22 MED ORDER — METHYLPREDNISOLONE ACETATE 40 MG/ML IJ SUSP
13.3300 mg | INTRAMUSCULAR | Status: AC | PRN
  Administered 2017-08-22: 13.33 mg

## 2017-08-22 NOTE — Progress Notes (Addendum)
Office Visit Note   Patient: Joann [name]           Date of Birth: [birth year]           MRN: [id number] Visit Date: 08/22/2017 Requested by: Vidal Schwalbe, MD [address] [place], [place] [address] PCP: Vidal Schwalbe, MD  Subjective: Chief Complaint  Patient presents with  . Right Hand - Follow-up, Pain    HPI: Patient presents with 58-monthhistory of right hand middle and ring finger triggering.  She is having stiffness and locking on a daily basis.  She works as a cDietitian  She would like to have them injected today.  Denies any history of trauma to the right hand.  Has tried some over-the-counter medicine without much relief.              ROS: All systems reviewed are negative as they relate to the chief complaint within the history of present illness.  Patient denies  fevers or chills.   Assessment & Plan: Visit Diagnoses:  1. Trigger middle finger of right hand   2. Trigger finger, right ring finger     Plan: Impression is symptomatic right middle and ring trigger finger.  Plan is ultrasound-guided injection into both of these today.  We will see how that works.  If she remains symptomatic then surgery would be the next option.  Follow-Up Instructions: Return if symptoms worsen or fail to improve.   Orders:  No orders of the defined types were placed in this encounter.  No orders of the defined types were placed in this encounter.     Procedures: Hand/UE Inj: R long A1 for trigger finger on 08/22/2017 4:59 PM Indications: therapeutic Details: 25 G needle, volar approach Medications: 0.33 mL bupivacaine 0.25 %; 13.33 mg methylPREDNISolone acetate 40 MG/ML; 3 mL lidocaine 1 % Outcome: tolerated well, no immediate complications Procedure, treatment alternatives, risks and benefits explained, specific risks discussed. Consent was given by the patient. Immediately prior to procedure a time out was called to verify the correct patient, procedure, equipment,  support staff and site/side marked as required. Patient was prepped and draped in the usual sterile fashion.   Hand/UE Inj: R ring A1 for trigger finger on 08/22/2017 5:01 PM Indications: therapeutic Details: 25 G needle, volar approach Medications: 0.33 mL bupivacaine 0.25 %; 13.33 mg methylPREDNISolone acetate 40 MG/ML; 3 mL lidocaine 1 % Outcome: tolerated well, no immediate complications Procedure, treatment alternatives, risks and benefits explained, specific risks discussed. Consent was given by the patient. Immediately prior to procedure a time out was called to verify the correct patient, procedure, equipment, support staff and site/side marked as required. Patient was prepped and draped in the usual sterile fashion.       Clinical Data: No additional findings.  Objective: Vital Signs: There were no vitals taken for this visit.  Physical Exam:   Constitutional: Patient appears well-developed HEENT:  Head: Normocephalic Eyes:EOM are normal Neck: Normal range of motion Cardiovascular: Normal rate Pulmonary/chest: Effort normal Neurologic: Patient is alert Skin: Skin is warm Psychiatric: Patient has normal mood and affect    Ortho Exam: Ortho exam demonstrates full active and passive range of motion of the wrist and hand.  She does have tenderness at the A1 pulley of the middle and ring finger.  Some triggering is palpable with finger flexion.  Motor or sensory function to the hand is intact.  Radial pulses intact.  Specialty Comments:  No specialty comments available.  Imaging: No results found.   PMFS History: Patient Active Problem List   Diagnosis Date Noted  . Right upper quadrant pain 08/21/2017  . Nausea without vomiting 08/21/2017  . Hyperlipidemia 01/01/2017  . Pre-diabetes 01/01/2017  . Shortness of breath 01/01/2017  . Palpitations 12/22/2016  . Chest pain 12/19/2016  . Hemorrhoids 12/19/2016  . Ulcerative colitis ([facility]) 12/19/2016   Past Medical  History:  Diagnosis Date  . Arthritis    knees and lower back  . Cancer ([facility])    skin cancer on nose  . Cataract   . DOE (dyspnea on exertion)   . Dyslipidemia   . GERD (gastroesophageal reflux disease)   . Hiatal hernia   . Hiatal hernia   . Total knee replacement status    x2    Family History  Problem Relation Age of Onset  . Alzheimer's disease Mother   . Colon cancer Neg Hx   . Stomach cancer Neg Hx   . Rectal cancer Neg Hx   . Esophageal cancer Neg Hx   . Liver cancer Neg Hx     Past Surgical History:  Procedure Laterality Date  . ABDOMINAL HYSTERECTOMY  1996  . arthroscopy left knee    . COLONOSCOPY  07/2011   [facility]   . FOREIGN BODY REMOVAL Right 02/27/2016   Procedure: REMOVAL FOREIGN BODY EXTREMITY;  Surgeon: Meredith Pel, MD;  Location: [facility];  Service: Orthopedics;  Laterality: Right;  . JOINT REPLACEMENT  10-2010  . KNEE ARTHROPLASTY  01/15/2011   Procedure: COMPUTER ASSISTED TOTAL KNEE ARTHROPLASTY;  Surgeon: Meredith Pel;  Location: [facility];  Service: Orthopedics;  Laterality: Right;  Right total knee arthroplasty  . NASAL SINUS SURGERY    . REPLACEMENT TOTAL KNEE BILATERAL    . right foot surgery    . TONSILLECTOMY     Social History   Occupational History  . Occupation: court reporter  Tobacco Use  . Smoking status: Former Smoker    Packs/day: 0.50    Years: 16.00    Pack years: 8.00    Types: Cigarettes    Last attempt to quit: 01/11/1991    Years since quitting: 26.6  . Smokeless tobacco: Never Used  Substance and Sexual Activity  . Alcohol use: No  . Drug use: No  . Sexual activity: Yes    Birth control/protection: Surgical

## 2017-08-27 ENCOUNTER — Encounter (HOSPITAL_BASED_OUTPATIENT_CLINIC_OR_DEPARTMENT_OTHER): Payer: Self-pay | Admitting: *Deleted

## 2017-09-01 ENCOUNTER — Encounter (HOSPITAL_BASED_OUTPATIENT_CLINIC_OR_DEPARTMENT_OTHER): Payer: Self-pay | Admitting: *Deleted

## 2017-09-02 ENCOUNTER — Other Ambulatory Visit: Payer: Self-pay

## 2017-09-02 ENCOUNTER — Encounter (HOSPITAL_BASED_OUTPATIENT_CLINIC_OR_DEPARTMENT_OTHER): Payer: Self-pay | Admitting: *Deleted

## 2017-09-02 NOTE — Progress Notes (Addendum)
Spoke w/ pt via phone for pre-op interview.  Npo after mn.  Arrive at 0930.  Will take prilosec, apriso, and myrbetriq am dos w/ sips of water.  Current ekg in care everywhere dated 12-19-2017 ([facility] , rate 92).  ADDENDUM:  Noted on schedule  pt surgery time has changed to start at 0730.  Called and lvm via phone for pt , informing her of surgery time change. Gave instructions to arrive at 0530 and to call back to confirm she received message.  ADDENDUM:  Pt called back via phone and lvm that confirming that she received and message and will arrive at 0530.

## 2017-09-03 ENCOUNTER — Other Ambulatory Visit: Payer: Self-pay | Admitting: Gastroenterology

## 2017-09-04 NOTE — Anesthesia Preprocedure Evaluation (Addendum)
Anesthesia Evaluation  Patient identified by MRN, date of birth, ID band Patient awake    Reviewed: Allergy & Precautions, NPO status , Patient's Chart, lab work & pertinent test results  History of Anesthesia Complications Negative for: history of anesthetic complications  Airway Mallampati: II  TM Distance: >3 FB Neck ROM: Full    Dental  (+) Dental Advisory Given, Edentulous Upper   Pulmonary former smoker,    breath sounds clear to auscultation       Cardiovascular negative cardio ROS   Rhythm:Regular Rate:Normal     Neuro/Psych negative neurological ROS  negative psych ROS   GI/Hepatic Neg liver ROS, hiatal hernia, GERD  Medicated,IBS   Endo/Other  negative endocrine ROS  Renal/GU negative Renal ROS     Musculoskeletal  (+) Arthritis ,   Abdominal   Peds  Hematology negative hematology ROS (+)   Anesthesia Other Findings   Reproductive/Obstetrics                            Lab Results  Component Value Date   WBC 7.6 02/23/2016   HGB 13.7 02/23/2016   HCT 41.7 02/23/2016   MCV 89.9 02/23/2016   PLT 254 02/23/2016   Lab Results  Component Value Date   CREATININE 0.65 08/21/2017   BUN 10 08/21/2017   NA 138 08/21/2017   K 4.0 08/21/2017   CL 100 08/21/2017   CO2 27 08/21/2017    Anesthesia Physical  Anesthesia Plan  ASA: II  Anesthesia Plan: MAC   Post-op Pain Management:    Induction: Intravenous  PONV Risk Score and Plan: 2 and Ondansetron and Propofol infusion  Airway Management Planned: Natural Airway, Simple Face Mask and Nasal Cannula  Additional Equipment:   Intra-op Plan:   Post-operative Plan:   Informed Consent: I have reviewed the patients History and Physical, chart, labs and discussed the procedure including the risks, benefits and alternatives for the proposed anesthesia with the patient or authorized representative who has indicated his/her  understanding and acceptance.   Dental advisory given  Plan Discussed with: Anesthesiologist and CRNA  Anesthesia Plan Comments:        Anesthesia Quick Evaluation

## 2017-09-05 ENCOUNTER — Ambulatory Visit (HOSPITAL_BASED_OUTPATIENT_CLINIC_OR_DEPARTMENT_OTHER): Payer: BC Managed Care – PPO | Admitting: Anesthesiology

## 2017-09-05 ENCOUNTER — Ambulatory Visit (HOSPITAL_BASED_OUTPATIENT_CLINIC_OR_DEPARTMENT_OTHER)
Admission: RE | Admit: 2017-09-05 | Discharge: 2017-09-05 | Disposition: A | Discharge status: Home / Self Care | Payer: BC Managed Care – PPO | Source: Ambulatory Visit | Attending: General Surgery | Admitting: General Surgery

## 2017-09-05 ENCOUNTER — Encounter (HOSPITAL_BASED_OUTPATIENT_CLINIC_OR_DEPARTMENT_OTHER): Payer: Self-pay | Admitting: *Deleted

## 2017-09-05 ENCOUNTER — Encounter (HOSPITAL_BASED_OUTPATIENT_CLINIC_OR_DEPARTMENT_OTHER)
Admission: RE | Disposition: A | Discharge status: Home / Self Care | Payer: Self-pay | Source: Ambulatory Visit | Attending: General Surgery

## 2017-09-05 DIAGNOSIS — Z79899 Other long term (current) drug therapy: Secondary | ICD-10-CM | POA: Diagnosis not present

## 2017-09-05 DIAGNOSIS — K219 Gastro-esophageal reflux disease without esophagitis: Secondary | ICD-10-CM | POA: Diagnosis not present

## 2017-09-05 DIAGNOSIS — Z87891 Personal history of nicotine dependence: Secondary | ICD-10-CM | POA: Insufficient documentation

## 2017-09-05 DIAGNOSIS — K642 Third degree hemorrhoids: Secondary | ICD-10-CM | POA: Insufficient documentation

## 2017-09-05 HISTORY — DX: Presence of dental prosthetic device (complete) (partial): Z97.2

## 2017-09-05 HISTORY — DX: Third degree hemorrhoids: K64.2

## 2017-09-05 HISTORY — DX: Overactive bladder: N32.81

## 2017-09-05 HISTORY — DX: Noninfective gastroenteritis and colitis, unspecified: K52.9

## 2017-09-05 HISTORY — DX: Personal history of other specified conditions: Z87.898

## 2017-09-05 HISTORY — DX: Unspecified osteoarthritis, unspecified site: M19.90

## 2017-09-05 HISTORY — DX: Personal history of other malignant neoplasm of skin: Z85.828

## 2017-09-05 HISTORY — PX: HEMORRHOID SURGERY: SHX153

## 2017-09-05 HISTORY — DX: Irritable bowel syndrome, unspecified: K58.9

## 2017-09-05 HISTORY — DX: Presence of spectacles and contact lenses: Z97.3

## 2017-09-05 SURGERY — HEMORRHOIDECTOMY
Anesthesia: Monitor Anesthesia Care | Site: Rectum

## 2017-09-05 MED ORDER — FENTANYL CITRATE (PF) 100 MCG/2ML IJ SOLN
25.0000 ug | INTRAMUSCULAR | Status: DC | PRN
  Filled 2017-09-05: qty 1

## 2017-09-05 MED ORDER — ACETAMINOPHEN 325 MG PO TABS
650.0000 mg | ORAL_TABLET | ORAL | Status: DC | PRN
  Filled 2017-09-05: qty 2

## 2017-09-05 MED ORDER — CELECOXIB 200 MG PO CAPS
ORAL_CAPSULE | ORAL | Status: AC
  Filled 2017-09-05: qty 1

## 2017-09-05 MED ORDER — SODIUM CHLORIDE 0.9 % IV SOLN
250.0000 mL | INTRAVENOUS | Status: DC | PRN
  Filled 2017-09-05: qty 250

## 2017-09-05 MED ORDER — ACETAMINOPHEN 500 MG PO TABS
1000.0000 mg | ORAL_TABLET | ORAL | Status: AC
  Administered 2017-09-05: 1000 mg via ORAL
  Filled 2017-09-05: qty 2

## 2017-09-05 MED ORDER — SODIUM CHLORIDE 0.9 % IV SOLN
INTRAVENOUS | Status: DC | PRN
  Administered 2017-09-05: 20 ug/kg/min via INTRAVENOUS

## 2017-09-05 MED ORDER — OXYCODONE HCL 5 MG PO TABS
5.0000 mg | ORAL_TABLET | ORAL | Status: DC | PRN
  Filled 2017-09-05: qty 2

## 2017-09-05 MED ORDER — PROPOFOL 500 MG/50ML IV EMUL
INTRAVENOUS | Status: DC | PRN
  Administered 2017-09-05: 200 ug/kg/min via INTRAVENOUS

## 2017-09-05 MED ORDER — GABAPENTIN 300 MG PO CAPS
300.0000 mg | ORAL_CAPSULE | ORAL | Status: AC
  Administered 2017-09-05: 300 mg via ORAL
  Filled 2017-09-05: qty 1

## 2017-09-05 MED ORDER — PROMETHAZINE HCL 25 MG/ML IJ SOLN
6.2500 mg | INTRAMUSCULAR | Status: DC | PRN
  Filled 2017-09-05: qty 1

## 2017-09-05 MED ORDER — ONDANSETRON HCL 4 MG/2ML IJ SOLN
INTRAMUSCULAR | Status: DC | PRN
  Administered 2017-09-05: 4 mg via INTRAVENOUS

## 2017-09-05 MED ORDER — ACETAMINOPHEN 650 MG RE SUPP
650.0000 mg | RECTAL | Status: DC | PRN
  Filled 2017-09-05: qty 1

## 2017-09-05 MED ORDER — MIDAZOLAM HCL 2 MG/2ML IJ SOLN
INTRAMUSCULAR | Status: DC | PRN
  Administered 2017-09-05: 2 mg via INTRAVENOUS

## 2017-09-05 MED ORDER — LIDOCAINE 2% (20 MG/ML) 5 ML SYRINGE
INTRAMUSCULAR | Status: DC | PRN
  Administered 2017-09-05: 25 mg via INTRAVENOUS

## 2017-09-05 MED ORDER — GABAPENTIN 300 MG PO CAPS
ORAL_CAPSULE | ORAL | Status: AC
  Filled 2017-09-05: qty 1

## 2017-09-05 MED ORDER — HEMOSTATIC AGENTS (NO CHARGE) OPTIME
TOPICAL | Status: DC | PRN
  Administered 2017-09-05: 1 via TOPICAL

## 2017-09-05 MED ORDER — PANTOPRAZOLE SODIUM 40 MG PO TBEC
40.0000 mg | DELAYED_RELEASE_TABLET | Freq: Every day | ORAL | Status: DC
  Administered 2017-09-05: 40 mg via ORAL
  Filled 2017-09-05: qty 1

## 2017-09-05 MED ORDER — SODIUM CHLORIDE 0.9% FLUSH
3.0000 mL | INTRAVENOUS | Status: DC | PRN
  Filled 2017-09-05: qty 3

## 2017-09-05 MED ORDER — BUPIVACAINE LIPOSOME 1.3 % IJ SUSP
INTRAMUSCULAR | Status: DC | PRN
  Administered 2017-09-05: 20 mL

## 2017-09-05 MED ORDER — LACTATED RINGERS IV SOLN
INTRAVENOUS | Status: DC
  Administered 2017-09-05: 07:00:00 via INTRAVENOUS
  Filled 2017-09-05: qty 1000

## 2017-09-05 MED ORDER — SODIUM CHLORIDE 0.9% FLUSH
3.0000 mL | Freq: Two times a day (BID) | INTRAVENOUS | Status: DC
  Filled 2017-09-05: qty 3

## 2017-09-05 MED ORDER — BUPIVACAINE-EPINEPHRINE 0.5% -1:200000 IJ SOLN
INTRAMUSCULAR | Status: DC | PRN
  Administered 2017-09-05: 30 mL

## 2017-09-05 MED ORDER — OXYCODONE HCL 5 MG PO TABS: 5.0000 mg | ORAL_TABLET | Freq: Four times a day (QID) | ORAL | 0 refills | Status: DC | PRN

## 2017-09-05 MED ORDER — ACETAMINOPHEN 500 MG PO TABS
ORAL_TABLET | ORAL | Status: AC
  Filled 2017-09-05: qty 2

## 2017-09-05 MED ORDER — DEXAMETHASONE SODIUM PHOSPHATE 10 MG/ML IJ SOLN
INTRAMUSCULAR | Status: DC | PRN
  Administered 2017-09-05: 5 mg via INTRAVENOUS

## 2017-09-05 MED ORDER — OXYCODONE HCL 5 MG PO TABS
ORAL_TABLET | ORAL | Status: AC
  Filled 2017-09-05: qty 1

## 2017-09-05 MED ORDER — BUPIVACAINE LIPOSOME 1.3 % IJ SUSP
20.0000 mL | Freq: Once | INTRAMUSCULAR | Status: DC
  Filled 2017-09-05: qty 20

## 2017-09-05 MED ORDER — OXYCODONE HCL 5 MG PO TABS
5.0000 mg | ORAL_TABLET | ORAL | Status: DC | PRN
  Administered 2017-09-05: 5 mg via ORAL
  Filled 2017-09-05: qty 1

## 2017-09-05 MED ORDER — CELECOXIB 200 MG PO CAPS
200.0000 mg | ORAL_CAPSULE | ORAL | Status: AC
  Administered 2017-09-05: 200 mg via ORAL
  Filled 2017-09-05: qty 1

## 2017-09-05 MED ORDER — DIAZEPAM 5 MG PO TABS: 5.0000 mg | ORAL_TABLET | Freq: Three times a day (TID) | ORAL | 0 refills | Status: DC | PRN

## 2017-09-05 SURGICAL SUPPLY — 43 items
BLADE EXTENDED COATED 6.5IN (ELECTRODE) IMPLANT
BLADE HEX COATED 2.75 (ELECTRODE) ×2 IMPLANT
BLADE SURG 10 STRL SS (BLADE) IMPLANT
BLADE SURG 15 STRL LF DISP TIS (BLADE) IMPLANT
BLADE SURG 15 STRL SS (BLADE)
BRIEF STRETCH FOR OB PAD LRG (UNDERPADS AND DIAPERS) ×2 IMPLANT
COVER BACK TABLE 60X90IN (DRAPES) ×2 IMPLANT
COVER MAYO STAND STRL (DRAPES) ×2 IMPLANT
DRAPE LAPAROTOMY 100X72 PEDS (DRAPES) ×2 IMPLANT
DRAPE UTILITY XL STRL (DRAPES) ×2 IMPLANT
ELECT REM PT RETURN 9FT ADLT (ELECTROSURGICAL) ×2
ELECTRODE REM PT RTRN 9FT ADLT (ELECTROSURGICAL) ×1 IMPLANT
GAUZE 4X4 16PLY RFD (DISPOSABLE) IMPLANT
GAUZE SPONGE 4X4 12PLY STRL (GAUZE/BANDAGES/DRESSINGS) ×2 IMPLANT
GLOVE BIO SURGEON STRL SZ 6.5 (GLOVE) ×4 IMPLANT
GLOVE BIOGEL PI IND STRL 6.5 (GLOVE) ×1 IMPLANT
GLOVE BIOGEL PI IND STRL 7.5 (GLOVE) ×1 IMPLANT
GLOVE BIOGEL PI INDICATOR 6.5 (GLOVE) ×1
GLOVE BIOGEL PI INDICATOR 7.5 (GLOVE) ×1
GLOVE INDICATOR 7.0 STRL GRN (GLOVE) ×2 IMPLANT
GOWN STRL REUS W/TWL 2XL LVL3 (GOWN DISPOSABLE) ×2 IMPLANT
GOWN STRL REUS W/TWL LRG LVL3 (GOWN DISPOSABLE) ×2 IMPLANT
KIT TURNOVER CYSTO (KITS) ×2 IMPLANT
NEEDLE HYPO 22GX1.5 SAFETY (NEEDLE) ×2 IMPLANT
NS IRRIG 500ML POUR BTL (IV SOLUTION) ×2 IMPLANT
PACK BASIN DAY SURGERY FS (CUSTOM PROCEDURE TRAY) ×2 IMPLANT
PAD ABD 8X10 STRL (GAUZE/BANDAGES/DRESSINGS) ×2 IMPLANT
PAD ARMBOARD 7.5X6 YLW CONV (MISCELLANEOUS) IMPLANT
PENCIL BUTTON HOLSTER BLD 10FT (ELECTRODE) ×2 IMPLANT
SPONGE SURGIFOAM ABS GEL 100 (HEMOSTASIS) IMPLANT
SPONGE SURGIFOAM ABS GEL 12-7 (HEMOSTASIS) IMPLANT
SUT CHROMIC 2 0 SH (SUTURE) ×4 IMPLANT
SUT CHROMIC 3 0 SH 27 (SUTURE) ×4 IMPLANT
SUT VIC AB 2-0 SH 27 (SUTURE) ×1
SUT VIC AB 2-0 SH 27XBRD (SUTURE) ×1 IMPLANT
SUT VIC AB 4-0 P-3 18XBRD (SUTURE) IMPLANT
SUT VIC AB 4-0 P3 18 (SUTURE)
SUT VIC AB 4-0 SH 18 (SUTURE) IMPLANT
SYR CONTROL 10ML LL (SYRINGE) ×2 IMPLANT
TRAY DSU PREP LF (CUSTOM PROCEDURE TRAY) ×2 IMPLANT
TUBE CONNECTING 12X1/4 (SUCTIONS) ×2 IMPLANT
WATER STERILE IRR 500ML POUR (IV SOLUTION) IMPLANT
YANKAUER SUCT BULB TIP NO VENT (SUCTIONS) ×2 IMPLANT

## 2017-09-05 NOTE — Discharge Instructions (Addendum)
Post Anesthesia Home Care Instructions  Activity: Get plenty of rest for the remainder of the day. A responsible adult should stay with you for 24 hours following the procedure.  For the next 24 hours, DO NOT: -Drive a car -Paediatric nurse -Drink alcoholic beverages -Take any medication unless instructed by your physician -Make any legal decisions or sign important papers.  Meals: Start with liquid foods such as gelatin or soup. Progress to regular foods as tolerated. Avoid greasy, spicy, heavy foods. If nausea and/or vomiting occur, drink only clear liquids until the nausea and/or vomiting subsides. Call your physician if vomiting continues.  Special Instructions/Symptoms: Your throat may feel dry or sore from the anesthesia or the breathing tube placed in your throat during surgery. If this causes discomfort, gargle with warm salt water. The discomfort should disappear within 24 hours.  If you had a scopolamine patch placed behind your ear for the management of post- operative nausea and/or vomiting:  1. The medication in the patch is effective for 72 hours, after which it should be removed.  Wrap patch in a tissue and discard in the trash. Wash hands thoroughly with soap and water. 2. You may remove the patch earlier than 72 hours if you experience unpleasant side effects which may include dry mouth, dizziness or visual disturbances. 3. Avoid touching the patch. Wash your hands with soap and water after contact with the patch.     ABDOMINAL SURGERY: POST OP INSTRUCTIONS  1. DIET: Follow a light bland diet the first 24 hours after arrival home, such as soup, liquids, crackers, etc.  Be sure to include lots of fluids daily.  Avoid fast food or heavy meals as your are more likely to get nauseated.  Do not eat any uncooked fruits or vegetables for the next 2 weeks as your colon heals. 2. Take your usually prescribed home medications unless otherwise directed. 3. PAIN CONTROL: a. Pain  is best controlled by a usual combination of three different methods TOGETHER: i. Ice/Heat ii. Over the counter pain medication iii. Prescription pain medication b. Most patients will experience some swelling and bruising around the incisions.  Ice packs or heating pads (30-60 minutes up to 6 times a day) will help. Use ice for the first few days to help decrease swelling and bruising, then switch to heat to help relax tight/sore spots and speed recovery.  Some people prefer to use ice alone, heat alone, alternating between ice & heat.  Experiment to what works for you.  Swelling and bruising can take several weeks to resolve.   c. It is helpful to take an over-the-counter pain medication regularly for the first few weeks.  Choose one of the following that works best for you: i. Naproxen (Aleve, etc)  Two 258m tabs twice a day ii. Ibuprofen (Advil, etc) Three 2024mtabs four times a day (every meal & bedtime) iii. Acetaminophen (Tylenol, etc) 500-65050mour times a day (every meal & bedtime) d. A  prescription for pain medication (such as oxycodone, hydrocodone, etc) should be given to you upon discharge.  Take your pain medication as prescribed.  i. If you are having problems/concerns with the prescription medicine (does not control pain, nausea, vomiting, rash, itching, etc), please call us [phone] see if we need to switch you to a different pain medicine that will work better for you and/or control your side effect better. ii. If you need a refill on your pain medication, please contact your pharmacy.  They will  contact our office to request authorization. Prescriptions will not be filled after 5 pm or on week-ends. 4. Avoid getting constipated.  Between the surgery and the pain medications, it is common to experience some constipation.  Increasing fluid intake and taking a fiber supplement (such as Metamucil, Citrucel, FiberCon, MiraLax, etc) 1-2 times a day regularly will usually help  prevent this problem from occurring.  A mild laxative (prune juice, Milk of Magnesia, MiraLax, etc) should be taken according to package directions if there are no bowel movements after 48 hours.   5. Watch out for diarrhea.  If you have many loose bowel movements, simplify your diet to bland foods & liquids for a few days.  Stop any stool softeners and decrease your fiber supplement.  Switching to mild anti-diarrheal medications (Kayopectate, Pepto Bismol) can help.  If this worsens or does not improve, please call us. 6. Wash / shower every day.  You may shower over the incision / wound.  Avoid baths until the skin is fully healed.  Continue to shower over incision(s) after the dressing is off. 7. Remove your waterproof bandages 5 days after surgery.  You may leave the incision open to air.  You may replace a dressing/Band-Aid to cover the incision for comfort if you wish. 8. ACTIVITIES as tolerated:   a. You may resume regular (light) daily activities beginning the next day--such as daily self-care, walking, climbing stairs--gradually increasing activities as tolerated.  If you can walk 30 minutes without difficulty, it is safe to try more intense activity such as jogging, treadmill, bicycling, low-impact aerobics, swimming, etc. b. Save the most intensive and strenuous activity for last such as sit-ups, heavy lifting, contact sports, etc  Refrain from any heavy lifting or straining until you are off narcotics for pain control.   c. DO NOT PUSH THROUGH PAIN.  Let pain be your guide: If it hurts to do something, don't do it.  Pain is your body warning you to avoid that activity for another week until the pain goes down. d. You may drive when you are no longer taking prescription pain medication, you can comfortably wear a seatbelt, and you can safely maneuver your car and apply brakes. e. Dennis Bast may have sexual intercourse when it is comfortable.  9. FOLLOW UP in our office a. Please call [facility] at [phone]  [phone] to set up an appointment to see your surgeon in the office for a follow-up appointment approximately 1-2 weeks after your surgery. b. Make sure that you call for this appointment the day you arrive home to insure a convenient appointment time. 10. IF YOU HAVE DISABILITY OR FAMILY LEAVE FORMS, BRING THEM TO THE OFFICE FOR PROCESSING.  DO NOT GIVE THEM TO YOUR DOCTOR.   WHEN TO CALL us [phone]: 1. Poor pain control 2. Reactions / problems with new medications (rash/itching, nausea, etc)  3. Fever over 101.5 F (38.5 C) 4. Inability to urinate 5. Nausea and/or vomiting 6. Worsening swelling or bruising 7. Continued bleeding from incision. 8. Increased pain, redness, or drainage from the incision  The clinic staff is available to answer your questions during regular business hours (8:30am-5pm).  Please dont hesitate to call and ask to speak to one of our nurses for clinical concerns.   A surgeon from [facility] is always on call at the hospitals   If you have a medical emergency, go to the nearest emergency room or call 911.    [facility], [place] [address] [place], [place], [place]  [address] ? MAIN: [phone] ? TOLL FREE: [phone] ? FAX [phone] [email or web address]

## 2017-09-05 NOTE — Anesthesia Postprocedure Evaluation (Signed)
Anesthesia Post Note  Patient: MELADY CHOW  Procedure(s) Performed: TWO COLUMN HEMORRHOIDECTOMY, HEMROIDPEXY (N/A Rectum)     Patient location during evaluation: PACU Anesthesia Type: MAC Level of consciousness: awake and alert Pain management: pain level controlled Vital Signs Assessment: post-procedure vital signs reviewed and stable Respiratory status: spontaneous breathing and respiratory function stable Cardiovascular status: stable Postop Assessment: no apparent nausea or vomiting Anesthetic complications: no    Last Vitals:  Vitals:   09/05/17 0820 09/05/17 0830  BP: 123/65 112/68  Pulse: 88 82  Resp: 14 14  Temp: (!) 36.3 C   SpO2: 98% 93%    Last Pain:  Vitals:   09/05/17 0845  TempSrc:   PainSc: (P) 0-No pain                 Ladashia Demarinis DANIEL

## 2017-09-05 NOTE — H&P (Signed)
The patient is a 67 year old female who presents with hemorrhoids. She is referred by Dr Loletha Carrow for bleeding hemorrhoids. She has seen to my other partners before both recommended hemorrhoidectomy. She reports that she has had chronic problems with diarrhea. She states that she has irritable bowel syndrome features with diarrhea. She has been on medications for the diarrhea and this has improved. She is now having semisolid bowel movements twice a day. She is taking supplemental fiber. She does drink plenty of water. Biggest issue is bleeding after BM's. She also has some issues with hemorrhoidal pain a couple times a week. She denies any prior rectal procedures. She reports fecal leakage on a daily basis. She has had a couple episodes of full incontinence. She had a recent colonoscopy which showed diverticulosis and a few benign polyps as well as hemorrhoids.   Problem List/Past Medical Leighton Ruff, MD; 05/30/7739 11:00 AM) INTERNAL AND EXTERNAL BLEEDING HEMORRHOIDS (K64.4, K64.8)  Past Surgical History Leighton Ruff, MD; 2/87/8676 11:00 AM) Breast Augmentation Bilateral. Breast Biopsy Left. Breast Mass; Local Excision Left. Colon Polyp Removal - Colonoscopy Foot Surgery Right. Hysterectomy (not due to cancer) - Complete Knee Surgery Bilateral. Oral Surgery Tonsillectomy  Diagnostic Studies History Leighton Ruff, MD; 08/16/9468 11:00 AM) Colonoscopy within last year Mammogram 1-3 years ago Pap Smear >5 years ago  Allergies Leighton Ruff, MD; 9/62/8366 11:00 AM) No Known Drug Allergies [09/26/2016]:  Medication History (Armen Ferguson, CMA; 07/25/2017 10:37 AM) Pravastatin Sodium (20MG Tablet, Oral) Active. Procto-Med HC (2.5% Cream, 1 (one) Rectal two times daily, Taken starting 09/26/2016) Active. TraMADol HCl (50MG Tablet, Oral as needed) Active. Apriso (0.375GM Capsule ER 24HR, 3 tabs Oral daily) Active. Viberzi (75MG Tablet, Oral)  Active. Hyoscyamine Sulfate (0.125MG Tab Sublingual, Sublingual) Active. Medications Reconciled  Social History Leighton Ruff, MD; 2/94/7654 11:00 AM) Alcohol use Remotely quit alcohol use. Caffeine use Coffee. No drug use Tobacco use Former smoker.  Family History Leighton Ruff, MD; 6/50/3546 11:00 AM) Arthritis Mother. Respiratory Condition Mother.  Pregnancy / Birth History Leighton Ruff, MD; 5/68/1275 11:00 AM) Age at menarche 10 years. Age of menopause 70-50 Contraceptive History Oral contraceptives. Gravida 3 Irregular periods Maternal age 70-20 Para 3  Other Problems Leighton Ruff, MD; 1/70/0174 11:00 AM) Back Pain Bladder Problems Gastroesophageal Reflux Disease Hemorrhoids Melanoma Oophorectomy Bilateral. Other disease, cancer, significant illness     Review of Systems  General Not Present- Appetite Loss, Chills, Fatigue, Fever, Night Sweats, Weight Gain and Weight Loss. Skin Not Present- Change in Wart/Mole, Dryness, Hives, Jaundice, New Lesions, Non-Healing Wounds, Rash and Ulcer. HEENT Present- Ringing in the Ears and Wears glasses/contact lenses. Not Present- Earache, Hearing Loss, Hoarseness, Nose Bleed, Oral Ulcers, Seasonal Allergies, Sinus Pain, Sore Throat, Visual Disturbances and Yellow Eyes. Respiratory Not Present- Bloody sputum, Chronic Cough, Difficulty Breathing, Snoring and Wheezing. Breast Not Present- Breast Mass, Breast Pain, Nipple Discharge and Skin Changes. Cardiovascular Not Present- Chest Pain, Difficulty Breathing Lying Down, Leg Cramps, Palpitations, Rapid Heart Rate, Shortness of Breath and Swelling of Extremities. Gastrointestinal Present- Abdominal Pain, Chronic diarrhea, Constipation, Hemorrhoids, Indigestion and Rectal Pain. Not Present- Bloating, Bloody Stool, Change in Bowel Habits, Difficulty Swallowing, Excessive gas, Gets full quickly at meals, Nausea and Vomiting. Female Genitourinary Present-  Frequency and Urgency. Not Present- Nocturia, Painful Urination and Pelvic Pain. Musculoskeletal Present- Back Pain. Not Present- Joint Pain, Joint Stiffness, Muscle Pain, Muscle Weakness and Swelling of Extremities. Neurological Not Present- Decreased Memory, Fainting, Headaches, Numbness, Seizures, Tingling, Tremor, Trouble walking and Weakness. Psychiatric Not Present-  Anxiety, Bipolar, Change in Sleep Pattern, Depression, Fearful and Frequent crying. Endocrine Not Present- Cold Intolerance, Excessive Hunger, Hair Changes, Heat Intolerance, Hot flashes and New Diabetes. Hematology Not Present- Blood Thinners, Easy Bruising, Excessive bleeding, Gland problems, HIV and Persistent Infections.  Vitals (Armen Ferguson CMA; 07/25/2017 10:37 AM) 07/25/2017 10:36 AM Weight: 190 lb Height: 64in Body Surface Area: 1.91 m Body Mass Index: 32.61 kg/m  Temp.: 98.75F  Pulse: 94 (Regular)  P.OX: 96% (Room air) BP: 128/78 (Sitting, Left Arm, Standard)      Physical Exam   The physical exam findings are as follows: Note:Constitutional: No acute distress, conversant, no deformities Eyes: Moist conjunctiva, no lid lag, anicteric, pupils equal round reactive to light Neck: Trachea midline; no thyromegaly Lungs: Normal respiratory effort; no tactile fremitus CV: Regular rate and rhythm, no palpable thrills, no pitting edema GI: Abdomen is soft, nontender; no palpable hepatomegaly Anorectal: DRE no palpable masses; mildly decreaed tone. MSK: Normal gait; no clubbing/cyanosis Psych: Appropriate affect; alert and oriented 3 Lymphatics: No palpable cervical or axillary lymphadenopathy    ANOSCOPY, DIAGNOSTIC (09983) [ Hemorrhoids ] Procedure Other: Procedure: Anoscopy....Marland KitchenMarland KitchenSurgeon: Marcello Moores....Marland KitchenMarland KitchenAfter the risks and benefits were explained, verbal consent was obtained for above procedure. A medical assistant chaperone was present thoroughout the entire procedure.  ....Marland KitchenMarland KitchenAnesthesia: none....Marland KitchenMarland KitchenDiagnosis: rectal bleeding....Marland KitchenMarland KitchenFindings: Grade 3 left lateral hemorrhoid with stigmata bleeding, easily reduced. Grade 2 right anterior and right posterior internal hemorrhoids with mild to moderate inflammation.  Performed: 07/25/2017 10:57 AM    Assessment & Plan   PROLAPSED INTERNAL HEMORRHOIDS, GRADE 3 (J82.5) Impression: 67 year old female with a history of chronic diarrhea and rectal bleeding. She is now on medications to control her diarrhea but continues to have rectal bleeding. She has had a colonoscopy which was negative for any other source for bleeding. On exam she has a grade 3 left lateral internal hemorrhoid. She has grade 2 hemorrhoids on the right side. It is rather obvious that her bleeding is mostly coming from her grade 3 hemorrhoid. Due to significant scarring on this side, I do not think she is a good rubber band ligation candidate. I have recommended hemorrhoidectomy. We have discussed this in detail. We discussed postoperative pain and bleeding. We discussed that hemorrhoidectomy does have the potential to make her fecal leakage worse but in my experience this to the time it gets better due to the lack of chronic prolapse. She has agreed to proceed with surgery. All questions were answered. We also discussed reducing the hemorrhoid on a daily basis to avoid further episodes of bleeding until she undergoes surgery.

## 2017-09-05 NOTE — Transfer of Care (Signed)
Immediate Anesthesia Transfer of Care Note  Patient: Joann [name]  Procedure(s) Performed: TWO COLUMN HEMORRHOIDECTOMY, HEMROIDPEXY (N/A Rectum)  Patient Location: PACU  Anesthesia Type:MAC  Level of Consciousness: awake, alert , oriented and patient cooperative  Airway & Oxygen Therapy: Patient Spontanous Breathing and Patient connected to nasal cannula oxygen  Post-op Assessment: Report given to RN and Post -op Vital signs reviewed and stable  Post vital signs: Reviewed and stable  Last Vitals:  Vitals Value Taken Time  BP    Temp    Pulse 88 09/05/2017  8:20 AM  Resp    SpO2 98 % 09/05/2017  8:20 AM  Vitals shown include unvalidated device data.  Last Pain:  Vitals:   09/05/17 0530  TempSrc: Oral         Complications: No apparent anesthesia complications

## 2017-09-05 NOTE — Anesthesia Procedure Notes (Signed)
Procedure Name: MAC Date/Time: 09/05/2017 7:29 AM Performed by: Wanita Chamberlain, CRNA Pre-anesthesia Checklist: Patient identified, Timeout performed, Emergency Drugs available, Suction available and Patient being monitored Patient Re-evaluated:Patient Re-evaluated prior to induction Oxygen Delivery Method: Nasal cannula Induction Type: IV induction

## 2017-09-05 NOTE — Op Note (Addendum)
09/05/2017  8:12 AM  PATIENT:  Joann [name]  67 y.o. female  Patient Care Team: Vidal Schwalbe, MD as PCP - General (Family Medicine)  PRE-OPERATIVE DIAGNOSIS:  GRADE 3 HEMORRHOID, BLEEDING  POST-OPERATIVE DIAGNOSIS:  GRADE 3 HEMORRHOIDS, BLEEDING  PROCEDURE:  TWO COLUMN HEMORRHOIDECTOMY, HEMORROIDALPEXY  SURGEON:  Surgeon(s): Leighton Ruff, MD  ASSISTANT: none   ANESTHESIA:   local and MAC  SPECIMEN:  Source of Specimen:  L lateral and R posterior hemorrhoids  DISPOSITION OF SPECIMEN:  PATHOLOGY  COUNTS:  YES  PLAN OF CARE: Discharge to home after PACU  PATIENT DISPOSITION:  PACU - hemodynamically stable.  INDICATION: 67 year old female with intractable rectal bleeding.  Colonoscopy was negative for any masses.  On exam she had at least 1 grade 3 internal hemorrhoid.  I recommended excision in the operating room.  Risk and benefits of surgery were discussed in detail including postoperative pain and recovery.  We discussed that we would try to address her other internal hemorrhoids if they appeared to also be causing her bleeding.   OR FINDINGS: Grade 3 right posterior and left lateral internal hemorrhoids with significant bleeding.  Grade 2 right anterior internal hemorrhoid  DESCRIPTION: the patient was identified in the preoperative holding area and taken to the OR where they were laid on the operating room table.  MAC anesthesia was induced without difficulty. The patient was then positioned in prone jackknife position with buttocks gently taped apart.  The patient was then prepped and draped in usual sterile fashion.  SCDs were noted to be in place prior to the initiation of anesthesia. A surgical timeout was performed indicating the correct patient, procedure, positioning and need for preoperative antibiotics.  A rectal block was performed using Marcaine with epinephrine mixed with Exparel.    I began with a digital rectal exam.  There were no masses noted.  Rectal tone  was good.  I then placed a Hill-Ferguson anoscope into the anal canal and evaluated this completely.  Patient had grade 3 internal hemorrhoids at right posterior and left lateral positions.  There was a grade 2 right anterior internal hemorrhoid.  I began with the right posterior hemorrhoid.  This was elevated off the sphincter complex using an Allis clamp.  Dissection was carried down between the sphincter complex and the hemorrhoidal tissue using blunt dissection with Metzenbaum scissors.  I divided down to the rectal mucosa.  Once the hemorrhoid was removed a 2-0 chromic suture was used to close the internal anal mucosa.  The remaining external hemorrhoidal tissue was dissected off of the dermal layer using Metzenbaum scissors.  The external anoderm was then closed from the dentate line distally using a 3-0 chromic suture.  I turned my attention to the right anterior internal hemorrhoid.  A 2-0 Vicryl suture was used to perform a hemorrhoidal pexy of this grade 2 hemorrhoid.  I then turned my attention to the left lateral hemorrhoid.  He had a large internal component and a small external component.  I decided that the best option would be to remove this hemorrhoid as well.  This was done in similar fashion to the right posterior hemorrhoid.  There was a small amount of suture line bleeding that was controlled with a interrupted 3-0 chromic suture on both the left lateral and right posterior suture lines.  Once this was complete a Gelfoam sponge was placed into the anal canal.  Lidocaine ointment and a dressing were applied.  The patient tolerated this well was sent  to the postanesthesia care unit stable condition.  All counts were correct per operating room staff.  I have reviewed the [place] for this patient.

## 2017-09-08 ENCOUNTER — Encounter (HOSPITAL_BASED_OUTPATIENT_CLINIC_OR_DEPARTMENT_OTHER): Payer: Self-pay | Admitting: General Surgery

## 2017-10-30 ENCOUNTER — Other Ambulatory Visit: Payer: Self-pay | Admitting: Gastroenterology

## 2017-11-26 ENCOUNTER — Other Ambulatory Visit: Payer: Self-pay | Admitting: Gastroenterology

## 2017-11-28 ENCOUNTER — Ambulatory Visit: Payer: BC Managed Care – PPO | Attending: General Surgery | Admitting: Physical Therapy

## 2017-11-28 ENCOUNTER — Other Ambulatory Visit: Payer: Self-pay

## 2017-11-28 ENCOUNTER — Encounter: Payer: Self-pay | Admitting: Physical Therapy

## 2017-11-28 DIAGNOSIS — M6281 Muscle weakness (generalized): Secondary | ICD-10-CM | POA: Diagnosis not present

## 2017-11-28 DIAGNOSIS — R278 Other lack of coordination: Secondary | ICD-10-CM

## 2017-11-28 NOTE — Therapy (Addendum)
[facility] [address] [place], [place], [address] Phone: [phone]   Fax:  [phone]  Physical Therapy Evaluation  Patient Details  Name: Joann [name] MRN: [id number] Date of Birth: 04-22-1950 Referring Provider (PT): Dr. Leighton Ruff   Encounter Date: 11/28/2017  PT End of Session - 11/28/17 0927    Visit Number  1    Date for PT Re-Evaluation  02/20/18    Authorization Type  BC[employer]    PT Start Time  [phone]   came late due to traffic   PT Stop Time  0930    PT Time Calculation (min)  38 min    Activity Tolerance  Patient tolerated treatment well    Behavior During Therapy  [facility] for tasks assessed/performed       Past Medical History:  Diagnosis Date  . Chronic diarrhea    due to IBS  . Dyslipidemia   . GERD (gastroesophageal reflux disease)   . Hiatal hernia   . History of palpitations   . History of skin cancer    nose  . IBS (irritable bowel syndrome)   . OA (osteoarthritis)    knees and lower back  . OAB (overactive bladder)   . Prolapsed internal hemorrhoids, grade 3   . Wears contact lenses   . Wears dentures    UPPER    Past Surgical History:  Procedure Laterality Date  . ABDOMINAL HYSTERECTOMY  1996    W/ BSO  . BREAST ENHANCEMENT SURGERY  1985  . BUNIONECTOMY WITH HAMMERTOE RECONSTRUCTION Right 07-08-2014    @ [facility]   1st toe bunionectomy;  2nd toe hammertoe correction  . CARDIOVASCULAR STRESS TEST  12-20-2016    [facility] , care everywhere in epic   norma nuclear perfusion study w/ no ischemia/  normal LV function and wall motion , ef 71%  . COLONOSCOPY  07/2011   [facility]   . FOREIGN BODY REMOVAL Right 02/27/2016   Procedure: REMOVAL FOREIGN BODY EXTREMITY;  Surgeon: Meredith Pel, MD;  Location: [facility];  Service: Orthopedics;  Laterality: Right;  . HEMORRHOID SURGERY N/A 09/05/2017   Procedure: TWO COLUMN HEMORRHOIDECTOMY, HEMROIDPEXY;  Surgeon:  Leighton Ruff, MD;  Location: [facility];  Service: General;  Laterality: N/A;  . KNEE ARTHROPLASTY  01/15/2011   Procedure: COMPUTER ASSISTED TOTAL KNEE ARTHROPLASTY;  Surgeon: Meredith Pel;  Location: [facility];  Service: Orthopedics;  Laterality: Right;  Right total knee arthroplasty  . KNEE ARTHROSCOPY Left 12/2009  . NASAL SINUS SURGERY  2017  . TONSILLECTOMY  age 65  . TOTAL KNEE ARTHROPLASTY Left 11-27-2010    dr dean   [facility]    There were no vitals filed for this visit.   Subjective Assessment - 11/28/17 0858    Subjective  Fecal incontinence and urgency after hemmorroid surgery on 09/05/2017. Patient is having urinary leakage    Patient Stated Goals  reduce fecal and urinary leakage    Currently in Pain?  No/denies    Multiple Pain Sites  No         [facility] PT Assessment - 11/28/17 0001      Assessment   Medical Diagnosis  Z98.890 s/p hemmorrhoidectomy    Referring Provider (PT)  Dr. Leighton Ruff    Onset Date/Surgical Date  09/05/17    Prior Therapy  none      Precautions   Precautions  None      Restrictions  Weight Bearing Restrictions  No      Balance Screen   Has the patient fallen in the past 6 months  Yes    How many times?  2   due to knees    Has the patient had a decrease in activity level because of a fear of falling?   No    Is the patient reluctant to leave their home because of a fear of falling?   No      Home Film/video editor residence      Prior Function   Level of Independence  Independent    Vocation  Full time employment    Leisure  none      Cognition   Overall Cognitive Status  Within Functional Limits for tasks assessed      Posture/Postural Control   Posture/Postural Control  Postural limitations    Postural Limitations  Rounded Shoulders;Forward head      ROM / Strength   AROM / PROM / Strength  AROM;PROM;Strength      AROM   Overall AROM Comments  decreased by 50% for extension and  sidebending      Strength   Overall Strength Comments  abdominal strength 2/5                Objective measurements completed on examination: See above findings.    Pelvic Floor Special Questions - 11/28/17 0001    Prior Pregnancies  Yes    Number of Pregnancies  3    Number of Vaginal Deliveries  3    Episiotomy Performed  Yes    Currently Sexually Active  Yes    Is this Painful  No    Urinary Leakage  Yes    Pad use  1 pad per day no pad at night    Activities that cause leaking  With strong urge    Fecal incontinence  Yes   unable to control gas, unable to stop leakage   Fluid intake  2 large waters    Caffeine beverages  1 cup per morning    Falling out feeling (prolapse)  Yes    Skin Integrity  Intact   dry   Prolapse  Anterior Wall    Pelvic Floor Internal Exam  Patient confirms identification and approves PT for assessment of the pelvic floor and treatment    Exam Type  Vaginal;Rectal    Palpation  decreased contraction of the puborectalis, hard to differentiate from the 2 different contractions of the vaginal and rectal    Strength  weak squeeze, no lift   vaginal and rectal              PT Education - 11/28/17 1026    Education Details  abdominal massage; information on vaginal lubricants; pelvic floor exercise    Person(s) Educated  Patient    Methods  Explanation;Demonstration;Verbal cues;Handout    Comprehension  Returned demonstration;Verbalized understanding       PT Short Term Goals - 11/28/17 1019      PT SHORT TERM GOAL #1   Title  independent with initial HEP    Time  4    Period  Weeks    Status  New    Target Date  12/26/17      PT SHORT TERM GOAL #2   Title  ability to contract the abdominals with pelvic floor contraction and not bear down     Time  4  Period  Weeks    Status  New    Target Date  12/26/17      PT SHORT TERM GOAL #3   Title  understand vaginal health to reduce dryness and improve tissue health     Time  4    Period  Weeks    Status  New    Target Date  12/26/17      PT SHORT TERM GOAL #4   Title  understand what bladder irritants are and how they affect the bladder    Time  4    Period  Weeks    Status  New    Target Date  12/26/17        PT Long Term Goals - 11/28/17 1023      PT LONG TERM GOAL #1   Title  independent with HEP and how to progress herself    Time  12    Period  Weeks    Status  New    Target Date  02/20/18      PT LONG TERM GOAL #2   Title  urinary leakage decreased >/= 75% due to increased pelvic floor strength >/= 3/5 holding for 15 seconds    Time  12    Period  Weeks    Status  New    Target Date  02/20/18      PT LONG TERM GOAL #3   Title  fecal leakage decreased >/= 75%  due to pelvic floor strength >/= 3/5 for 30 seconds    Time  12    Period  Weeks    Status  New    Target Date  02/20/18      PT LONG TERM GOAL #4   Title  ability to differentiate between the anal sphincter and vaginal sphincter to control with the urge     Time  12    Period  Weeks    Status  New    Target Date  02/20/18      PT LONG TERM GOAL #5   Title  ability to control her gas 75% of the time due to improved coordinaiton of the anal sphincter    Time  12    Period  Weeks    Status  New    Target Date  02/20/18             Plan - 11/28/17 1583    Clinical Impression Statement  Patient is a 67 year old female with fecal and urinary incontinence since 09/05/2017. Patient reports no pelvic pain.  Patient will leak urine when she has the urge to urinate. Patient will leak stool when she has the urge and unable to stop it. Pelvic floor strength for anal sphincter and vaginal sphincter is 2/5. Patient has weakness in the anterior vagina wall. Patient puborectalis does not move forward with pelvic floor contraction and she is not able to contract the puborectalis forward. Abdominal strength is 2/5 and she will bulge her abdomen with pelvic floor contraction.  Patient will benefit from skilled therapy to improve pelvic floor strength and coordination to reduce fecal and urinary leakage.     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; when she starts to leak stool unable to stop in public    Clinical  Decision Making  Moderate    Rehab Potential  Excellent    Clinical Impairments Affecting Rehab Potential  abdominal hysterectomy; hemrroidectomy 09/05/2017    PT Frequency  1x / week    PT Duration  12 weeks    PT Treatment/Interventions  Biofeedback;Therapeutic exercise;Therapeutic activities;Neuromuscular re-education;Patient/family education;Manual techniques;Dry needling    PT Next Visit Plan  hip stretches; vaginal health; pelvic floor exercises with manual technique; toileting technique    Consulted and Agree with Plan of Care  Patient       Patient will benefit from skilled therapeutic intervention in order to improve the following deficits and impairments:  Increased fascial restricitons, Decreased activity tolerance, Decreased endurance, Decreased strength  Visit Diagnosis: Muscle weakness (generalized) - Plan: PT plan of care cert/re-cert  Other lack of coordination - Plan: PT plan of care cert/re-cert     Problem List Patient Active Problem List   Diagnosis Date Noted  . Right upper quadrant pain 08/21/2017  . Nausea without vomiting 08/21/2017  . Hyperlipidemia 01/01/2017  . Pre-diabetes 01/01/2017  . Shortness of breath 01/01/2017  . Palpitations 12/22/2016  . Chest pain 12/19/2016  . Hemorrhoids 12/19/2016  . Ulcerative colitis ([place]) 12/19/2016    Earlie Counts, PT 11/28/17 10:30 AM   [facility] [address], [place], [place], [address] Phone: [phone]    Fax:  [phone]  Name: JAN WALTERS MRN: [id number] Date of Birth: May 03, 1950 PHYSICAL THERAPY DISCHARGE SUMMARY  Visits from Start of Care: 1  Current functional level related to goals / functional outcomes: See above.    Remaining deficits: Patient only came to the initial visit. She has cancelled or no show many visits therefore she is being discharged.    Education / Equipment: HEP Plan:                                                    Patient goals were not met. Patient is being discharged due to not returning since the last visit.  Thank you for the referral. Earlie Counts, PT 01/30/18 9:58 AM  ?????

## 2017-11-28 NOTE — Patient Instructions (Addendum)
Lubrication . Used for intercourse to reduce friction . Avoid ones that have glycerin, warming gels, tingling gels, icing or cooling gel, scented . Avoid parabens due to a preservative similar to female sex hormone . May need to be reapplied once or several times during sexual activity . Can be applied to both partners genitals prior to vaginal penetration to minimize friction or irritation . Prevent irritation and mucosal tears that cause post coital pain and increased the risk of vaginal and urinary tract infections . Oil-based lubricants cannot be used with condoms due to breaking them down.  Least likely to irritate vaginal tissue.  . Plant based-lubes are safe . Silicone-based lubrication are thicker and last long and used for post-menopausal women  Vaginal Lubricators Here is a list of some suggested lubricators you can use for intercourse. Use the most hypoallergenic product.  You can place on you or your partner.   Slippery Stuff  Sylk or Sliquid Natural H2O ( good  if frequent UTI's)  Blossom Organics (www.blossom-organics.com)  Luvena   Coconut oil  PJur Woman Nude- water based lubricant, amazon  Uberlube- Amazon  Aloe Vera  Yes lubricant- [employer] Platinum-Silicone, [employer], [facility]  Olive and Bee intimate cream-  www.oliveandbee.com.au Things to avoid in lubricants are glycerin, warming gels, tingling gels, icing or cooling  gels, and scented gels.  Also avoid Vaseline. KY jelly, Replens, and Astroglide kills good bacteria(lactobacilli)  Things to avoid in the vaginal area . Do not use things to irritate the vulvar area . No lotions- see below . No soaps; can use Aveeno or Calendula cleanser if needed. Must be gentle . No deodorants . No douches . Good to sleep without underwear to let the vaginal area to air out . No scrubbing: spread the lips to let warm water rinse over labias and pat dry  Creams that can be used on the Ragan Releveum or [employer] Gele    About Abdominal Massage  Abdominal massage, also called external colon massage, is a self-treatment circular massage technique that can reduce and eliminate gas and ease constipation. The colon naturally contracts in waves in a clockwise direction starting from inside the right hip, moving up toward the ribs, across the belly, and down inside the left hip.  When you perform circular abdominal massage, you help stimulate your colon's normal wave pattern of movement called peristalsis.  It is most beneficial when done after eating.  Positioning You can practice abdominal massage with oil while lying down, or in the shower with soap.  Some people find that it is just as effective to do the massage through clothing while sitting or standing.  How to Massage Start by placing your finger tips or knuckles on your right side, just inside your hip bone.  . Make small circular movements while you move upward toward your rib cage.   . Once you reach the bottom right side of your rib cage, take your circular movements across to the left side of the bottom of your rib cage.  . Next, move downward until you reach the inside of your left hip bone.  This is the path your feces travel in your colon. . Continue to perform your abdominal massage in this pattern for 10 minutes each day.     You can apply as much pressure as is comfortable in your  massage.  Start gently and build pressure as you continue to practice.  Notice any areas of pain as you massage; areas of slight pain may be relieved as you massage, but if you have areas of significant or intense pain, consult with your healthcare provider.  Other Considerations . General physical activity including bending and stretching can have a beneficial massage-like effect on the colon.  Deep breathing can also  stimulate the colon because breathing deeply activates the same nervous system that supplies the colon.   . Abdominal massage should always be used in combination with a bowel-conscious diet that is high in the proper type of fiber for you, fluids (primarily water), and a regular exercise program. Quick Contraction: Gravity Eliminated (Hook-Lying)    Lie with hips and knees bent. Quickly squeeze then fully relax pelvic floor. Perform _1__ sets of _5__. Rest for _1__ seconds between sets. Do _2__ times a day.   Copyright  VHI. All rights reserved.  Slow Contraction: Gravity Eliminated (Hook-Lying)    Lie with hips and knees bent. Slowly squeeze pelvic floor for __5_ seconds. Rest for __5_ seconds. Repeat _5__ times. Do __2_ times a day.   Copyright  VHI. All rights reserved.  [facility] [address] [place], [place] [address] Phone # [phone] Fax [phone]

## 2017-12-12 ENCOUNTER — Ambulatory Visit: Payer: BC Managed Care – PPO | Admitting: Physical Therapy

## 2017-12-12 ENCOUNTER — Encounter

## 2018-01-02 ENCOUNTER — Encounter: Payer: BC Managed Care – PPO | Admitting: Physical Therapy

## 2018-01-07 ENCOUNTER — Ambulatory Visit: Payer: BC Managed Care – PPO | Admitting: Physical Therapy

## 2018-01-09 ENCOUNTER — Encounter: Payer: BC Managed Care – PPO | Admitting: Physical Therapy

## 2018-01-12 ENCOUNTER — Other Ambulatory Visit: Payer: Self-pay | Admitting: Gastroenterology

## 2018-01-15 ENCOUNTER — Other Ambulatory Visit: Payer: Self-pay | Admitting: Gastroenterology

## 2018-01-16 ENCOUNTER — Telehealth: Payer: Self-pay | Admitting: Physical Therapy

## 2018-01-16 ENCOUNTER — Ambulatory Visit: Payer: BC Managed Care – PPO | Attending: General Surgery | Admitting: Physical Therapy

## 2018-01-16 NOTE — Telephone Encounter (Signed)
Called patient about her 9:30 appointment she did not show for. Left a message for her to call back.  Earlie Counts, PT @12 /20/2019@ 10:08 AM

## 2018-01-23 ENCOUNTER — Ambulatory Visit: Payer: BC Managed Care – PPO | Admitting: Physical Therapy

## 2018-01-30 ENCOUNTER — Telehealth: Payer: Self-pay | Admitting: Physical Therapy

## 2018-01-30 ENCOUNTER — Ambulatory Visit: Payer: BC Managed Care – PPO | Attending: General Surgery | Admitting: Physical Therapy

## 2018-01-30 NOTE — Telephone Encounter (Signed)
Called patient about her 9:30 appointment she missed. Left a message. Earlie Counts, PT @1 /03/2018@ 9:54 AM

## 2018-02-06 ENCOUNTER — Encounter: Payer: BC Managed Care – PPO | Admitting: Physical Therapy

## 2018-02-13 ENCOUNTER — Encounter: Payer: BC Managed Care – PPO | Admitting: Physical Therapy

## 2018-02-20 ENCOUNTER — Encounter: Payer: BC Managed Care – PPO | Admitting: Physical Therapy

## 2018-02-27 ENCOUNTER — Encounter: Payer: BC Managed Care – PPO | Admitting: Physical Therapy

## 2018-03-13 ENCOUNTER — Ambulatory Visit (INDEPENDENT_AMBULATORY_CARE_PROVIDER_SITE_OTHER): Payer: Self-pay

## 2018-03-13 ENCOUNTER — Encounter (INDEPENDENT_AMBULATORY_CARE_PROVIDER_SITE_OTHER): Payer: Self-pay | Admitting: Orthopedic Surgery

## 2018-03-13 ENCOUNTER — Ambulatory Visit (INDEPENDENT_AMBULATORY_CARE_PROVIDER_SITE_OTHER): Payer: BC Managed Care – PPO | Admitting: Orthopedic Surgery

## 2018-03-13 VITALS — Ht 63.0 in | Wt 183.0 lb

## 2018-03-13 DIAGNOSIS — M79641 Pain in right hand: Secondary | ICD-10-CM

## 2018-03-13 DIAGNOSIS — M180 Bilateral primary osteoarthritis of first carpometacarpal joints: Secondary | ICD-10-CM

## 2018-03-14 ENCOUNTER — Encounter (INDEPENDENT_AMBULATORY_CARE_PROVIDER_SITE_OTHER): Payer: Self-pay | Admitting: Orthopedic Surgery

## 2018-03-14 DIAGNOSIS — M180 Bilateral primary osteoarthritis of first carpometacarpal joints: Secondary | ICD-10-CM | POA: Diagnosis not present

## 2018-03-14 MED ORDER — BUPIVACAINE HCL 0.25 % IJ SOLN
0.3300 mL | INTRAMUSCULAR | Status: AC | PRN
  Administered 2018-03-14: .33 mL via INTRA_ARTICULAR

## 2018-03-14 MED ORDER — METHYLPREDNISOLONE ACETATE 40 MG/ML IJ SUSP
13.3300 mg | INTRAMUSCULAR | Status: AC | PRN
  Administered 2018-03-14: 13.33 mg via INTRA_ARTICULAR

## 2018-03-14 MED ORDER — LIDOCAINE HCL 1 % IJ SOLN
3.0000 mL | INTRAMUSCULAR | Status: AC | PRN
  Administered 2018-03-14: 3 mL

## 2018-03-14 NOTE — Progress Notes (Signed)
Office Visit Note   Patient: Joann [name]           Date of Birth: 11/01/1950           MRN: [id number] Visit Date: 03/13/2018 Requested by: Vidal Schwalbe, MD [address] [place], [place] [address] PCP: Vidal Schwalbe, MD  Subjective: Chief Complaint  Patient presents with  . Right Hand - Pain    HPI: Patient presents with right hand pain.  She had an injection in July 2019 which did not help very much.  She is a court reporter.  Her symptoms began when she finished a 5000 word/page transcription which was very difficult.  She also describes locking in the long and ring fingers of that right hand.  This is becoming debilitating as well.  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  She is right-hand dominant.  All the symptoms with thumb pain as well as the locking in the finger started about 3 months before the last injection in July of last year.  She had an extremely heavy volume of typing prior to development of the symptoms which we initially treated her for in July 2019.              ROS: All systems reviewed are negative as they relate to the chief complaint within the history of present illness.  Patient denies  fevers or chills.   Assessment & Plan: Visit Diagnoses:  1. Pain in right hand   2. Arthritis of carpometacarpal (CMC) joint of both thumbs     Plan: Impression is right sided CMC arthritis and trigger fingers 3 and 4.  She is having daily triggering as well as pain in that [facility] joint.  I think a [place] joint injection under fluoroscopy could help with the pain.  The triggering we tried injections before and it did not help but it is currently coming more symptomatic and problematic with ADLs.  She is going to consider her options and let me know if she wants to have the trigger digits addressed.  I will see her back as needed  Follow-Up Instructions: Return if symptoms worsen or fail to improve.   Orders:  Orders Placed This Encounter    Procedures  . XR Hand Complete Right  . XR C-ARM NO REPORT   No orders of the defined types were placed in this encounter.     Procedures: Small Joint Inj: R thumb CMC on 03/14/2018 8:56 AM Indications: pain Details: 25 G needle, fluoroscopy-guided radial approach  Spinal Needle: No  Medications: 3 mL lidocaine 1 %; 0.33 mL bupivacaine 0.25 %; 13.33 mg methylPREDNISolone acetate 40 MG/ML Outcome: tolerated well, no immediate complications Procedure, treatment alternatives, risks and benefits explained, specific risks discussed. Consent was given by the patient. Immediately prior to procedure a time out was called to verify the correct patient, procedure, equipment, support staff and site/side marked as required. Patient was prepped and draped in the usual sterile fashion.       Clinical Data: No additional findings.  Objective: Vital Signs: Ht 5' 3"  (1.6 m)   Wt 183 lb (83 kg)   BMI 32.42 kg/m   Physical Exam:   Constitutional: Patient appears well-developed HEENT:  Head: Normocephalic Eyes:EOM are normal Neck: Normal range of motion Cardiovascular: Normal rate Pulmonary/chest: Effort normal Neurologic: Patient is alert Skin: Skin is warm Psychiatric: Patient has normal mood and affect    Ortho Exam: Ortho exam demonstrates full  active and passive range of motion of the wrist and hand.  She has positive CMC grind test bilaterally.  She also has tenderness over the A1 pulley at the base of the right digits 3 and 4 with triggering present.  Grip strength somewhat diminished on the right compared to the left.  Elbow shoulder range of motion intact.  Specialty Comments:  No specialty comments available.  Imaging: Xr C-arm No Report  Result Date: 03/13/2018 Please see Notes tab for imaging impression.  Xr Hand Complete Right  Result Date: 03/13/2018 AP lateral oblique right hand reviewed.  No fracture or dislocation is present.  No radiocarpal arthritis.   Significant CMC arthritis is noted.    PMFS History: Patient Active Problem List   Diagnosis Date Noted  . Right upper quadrant pain 08/21/2017  . Nausea without vomiting 08/21/2017  . Hyperlipidemia 01/01/2017  . Pre-diabetes 01/01/2017  . Shortness of breath 01/01/2017  . Palpitations 12/22/2016  . Chest pain 12/19/2016  . Hemorrhoids 12/19/2016  . Ulcerative colitis ([place]) 12/19/2016   Past Medical History:  Diagnosis Date  . Chronic diarrhea    due to IBS  . Dyslipidemia   . GERD (gastroesophageal reflux disease)   . Hiatal hernia   . History of palpitations   . History of skin cancer    nose  . IBS (irritable bowel syndrome)   . OA (osteoarthritis)    knees and lower back  . OAB (overactive bladder)   . Prolapsed internal hemorrhoids, grade 3   . Wears contact lenses   . Wears dentures    UPPER    Family History  Problem Relation Age of Onset  . Alzheimer's disease Mother   . Colon cancer Neg Hx   . Stomach cancer Neg Hx   . Rectal cancer Neg Hx   . Esophageal cancer Neg Hx   . Liver cancer Neg Hx     Past Surgical History:  Procedure Laterality Date  . ABDOMINAL HYSTERECTOMY  1996    W/ BSO  . BREAST ENHANCEMENT SURGERY  1985  . BUNIONECTOMY WITH HAMMERTOE RECONSTRUCTION Right 07-08-2014    @ [facility]   1st toe bunionectomy;  2nd toe hammertoe correction  . CARDIOVASCULAR STRESS TEST  12-20-2016    [facility] Cardiology , care everywhere in epic   norma nuclear perfusion study w/ no ischemia/  normal LV function and wall motion , ef 71%  . COLONOSCOPY  07/2011   [facility]   . FOREIGN BODY REMOVAL Right 02/27/2016   Procedure: REMOVAL FOREIGN BODY EXTREMITY;  Surgeon: Meredith Pel, MD;  Location: [place];  Service: Orthopedics;  Laterality: Right;  . HEMORRHOID SURGERY N/A 09/05/2017   Procedure: TWO COLUMN HEMORRHOIDECTOMY, HEMROIDPEXY;  Surgeon: Leighton Ruff, MD;  Location: [facility];  Service: General;  Laterality: N/A;    . KNEE ARTHROPLASTY  01/15/2011   Procedure: COMPUTER ASSISTED TOTAL KNEE ARTHROPLASTY;  Surgeon: Meredith Pel;  Location: [place];  Service: Orthopedics;  Laterality: Right;  Right total knee arthroplasty  . KNEE ARTHROSCOPY Left 12/2009  . NASAL SINUS SURGERY  2017  . TONSILLECTOMY  age 5  . TOTAL KNEE ARTHROPLASTY Left 11-27-2010    dr Deborrah Mabin   [facility]   Social History   Occupational History  . Occupation: court reporter  Tobacco Use  . Smoking status: Former Smoker    Packs/day: 0.50    Years: 16.00    Pack years: 8.00    Types: Cigarettes  Last attempt to quit: 01/11/1991    Years since quitting: 27.1  . Smokeless tobacco: Never Used  Substance and Sexual Activity  . Alcohol use: No  . Drug use: No  . Sexual activity: Yes    Birth control/protection: Surgical

## 2018-03-23 ENCOUNTER — Other Ambulatory Visit: Payer: Self-pay | Admitting: Gastroenterology

## 2018-04-16 ENCOUNTER — Other Ambulatory Visit: Payer: Self-pay | Admitting: Gastroenterology

## 2018-06-16 ENCOUNTER — Other Ambulatory Visit: Payer: Self-pay | Admitting: Podiatry

## 2018-06-16 ENCOUNTER — Ambulatory Visit: Payer: BC Managed Care – PPO | Admitting: Podiatry

## 2018-06-16 ENCOUNTER — Ambulatory Visit (INDEPENDENT_AMBULATORY_CARE_PROVIDER_SITE_OTHER): Payer: BC Managed Care – PPO

## 2018-06-16 ENCOUNTER — Other Ambulatory Visit: Payer: Self-pay

## 2018-06-16 ENCOUNTER — Encounter: Payer: Self-pay | Admitting: Podiatry

## 2018-06-16 VITALS — BP 122/74 | HR 81 | Temp 97.5°F | Resp 16

## 2018-06-16 DIAGNOSIS — M722 Plantar fascial fibromatosis: Secondary | ICD-10-CM | POA: Diagnosis not present

## 2018-06-16 DIAGNOSIS — M2580 Other specified joint disorders, unspecified joint: Secondary | ICD-10-CM

## 2018-06-16 DIAGNOSIS — M25571 Pain in right ankle and joints of right foot: Secondary | ICD-10-CM

## 2018-06-16 DIAGNOSIS — M2141 Flat foot [pes planus] (acquired), right foot: Secondary | ICD-10-CM

## 2018-06-16 DIAGNOSIS — M25572 Pain in left ankle and joints of left foot: Secondary | ICD-10-CM

## 2018-06-16 DIAGNOSIS — M2142 Flat foot [pes planus] (acquired), left foot: Secondary | ICD-10-CM

## 2018-06-16 DIAGNOSIS — M79671 Pain in right foot: Secondary | ICD-10-CM

## 2018-06-16 DIAGNOSIS — M76829 Posterior tibial tendinitis, unspecified leg: Secondary | ICD-10-CM

## 2018-06-16 MED ORDER — DICLOFENAC SODIUM 1 % TD GEL: 2.0000 g | Freq: Four times a day (QID) | TRANSDERMAL | 2 refills | Status: DC

## 2018-06-16 MED ORDER — TRIAMCINOLONE ACETONIDE 10 MG/ML IJ SUSP
10.0000 mg | Freq: Once | INTRAMUSCULAR | Status: AC
  Administered 2018-06-16: 10 mg

## 2018-06-16 NOTE — Progress Notes (Signed)
Subjective:    Patient ID: Gwenyth Allegra, female    DOB: May 19, 1950, 68 y.o.   MRN: [id number]  HPI 68 year old female presents the office today for concerns of right foot pain.  She points towards the plantar forefoot, submetatarsal 1 mostly on the last 1 year as well as arch of the foot.  She states that she previously had 4 foot surgeries.  She had 4 surgeries on 5 3 and applying in 1 to help with the arch of the foot.  The original surgery was 98 to be followed by 99 today.  She states that she later had surgery with Dr. Posey Pronto and was in [facility] emergency arch reconstruction on the right side.  She is been wearing an ankle brace which does help.  She has pain mostly to the medial aspect of both of her feet.  Is been on for the last 2 months in the right is worse than normal.  She states that since the arch reconstruction on the right side she feels that her arch may be falling some.  No recent injury.   Review of Systems  All other systems reviewed and are negative.  Past Medical History:  Diagnosis Date  . Chronic diarrhea    due to IBS  . Dyslipidemia   . GERD (gastroesophageal reflux disease)   . Hiatal hernia   . History of palpitations   . History of skin cancer    nose  . IBS (irritable bowel syndrome)   . OA (osteoarthritis)    knees and lower back  . OAB (overactive bladder)   . Prolapsed internal hemorrhoids, grade 3   . Wears contact lenses   . Wears dentures    UPPER    Past Surgical History:  Procedure Laterality Date  . ABDOMINAL HYSTERECTOMY  1996    W/ BSO  . BREAST ENHANCEMENT SURGERY  1985  . BUNIONECTOMY WITH HAMMERTOE RECONSTRUCTION Right 07-08-2014    @ [facility]   1st toe bunionectomy;  2nd toe hammertoe correction  . CARDIOVASCULAR STRESS TEST  12-20-2016    [facility] Cardiology , care everywhere in epic   norma nuclear perfusion study w/ no ischemia/  normal LV function and wall motion , ef 71%  . COLONOSCOPY  07/2011   [facility]   .  FOREIGN BODY REMOVAL Right 02/27/2016   Procedure: REMOVAL FOREIGN BODY EXTREMITY;  Surgeon: Meredith Pel, MD;  Location: [place];  Service: Orthopedics;  Laterality: Right;  . HEMORRHOID SURGERY N/A 09/05/2017   Procedure: TWO COLUMN HEMORRHOIDECTOMY, HEMROIDPEXY;  Surgeon: Leighton Ruff, MD;  Location: [facility];  Service: General;  Laterality: N/A;  . KNEE ARTHROPLASTY  01/15/2011   Procedure: COMPUTER ASSISTED TOTAL KNEE ARTHROPLASTY;  Surgeon: Meredith Pel;  Location: [place];  Service: Orthopedics;  Laterality: Right;  Right total knee arthroplasty  . KNEE ARTHROSCOPY Left 12/2009  . NASAL SINUS SURGERY  2017  . TONSILLECTOMY  age 87  . TOTAL KNEE ARTHROPLASTY Left 11-27-2010    dr dean   [facility]     Current Outpatient Medications:  .  acetaminophen (TYLENOL) 500 MG tablet, Take 500 mg by mouth every 6 (six) hours as needed., Disp: , Rfl:  .  hyoscyamine (LEVSIN SL) 0.125 MG SL tablet, DISSOLVE 1 TABLET UNDER THE TONGUE EVERY 6 HOURS AS NEEDED, Disp: 45 tablet, Rfl: 0 .  mesalamine (APRISO) 0.375 g 24 hr capsule, TAKE 4 CAPSULES BY MOUTH ONCE DAILY, Disp: 360 capsule, Rfl: 3 .  mirabegron ER (MYRBETRIQ) 50 MG TB24 tablet, Take 50 mg by mouth every morning. , Disp: , Rfl:  .  omeprazole (PRILOSEC OTC) 20 MG tablet, Take 20 mg by mouth daily as needed (for acid reflux/[place].)., Disp: , Rfl:  .  Soft Lens Products (REWETTING DROPS) SOLN, Apply 1 drop to eye daily as needed (for dry contact lenses)., Disp: , Rfl:  .  diclofenac sodium (VOLTAREN) 1 % GEL, Apply 2 g topically 4 (four) times daily. Rub into affected area of foot 2 to 4 times daily, Disp: 100 g, Rfl: 2 .  VIBERZI 75 MG TABS, Take 1 tablet by mouth twice daily, Disp: 60 tablet, Rfl: 0  No Known Allergies      Objective:   Physical Exam General: AAO x3, NAD  Dermatological: Skin is warm, dry and supple bilateral. Nails x 10 are well manicured; remaining integument appears unremarkable at this time.  There are no open sores, no preulcerative lesions, no rash or signs of infection present.  Vascular: Dorsalis Pedis artery and Posterior Tibial artery pedal pulses are 2/4 bilateral with immedate capillary fill time. Pedal hair growth present. No varicosities and no lower extremity edema present bilateral. There is no pain with calf compression, swelling, warmth, erythema.   Neruologic: Grossly intact via light touch bilateral. Vibratory intact via tuning fork bilateral. Protective threshold with Semmes Wienstein monofilament intact to all pedal sites bilateral.  Musculoskeletal: There is a decrease in medial arch on the left side worse than the right.  There is subjective tenderness palpation the articular medial band plantar fashion the arch of the foot and she started get discomfort to the medial aspect the left and right ankle this is along the course of the posterior tibial tendon just posterior to medial malleolus.  Overall the tendon appears to be intact.  Again the deltoid ligaments.  No pain of the peroneal tendons.  Mild discomfort right foot submetatarsal 1 along the course of the sesamoids.  No specific pain to the sesamoid complex.  Plantarflexed first ray on the right side.  There is no area pinpoint tenderness otherwise.  MMT 5/5.  Range of motion intact.  Gait: Unassisted, Nonantalgic.     Assessment & Plan:  68 year old female with bilateral foot pain located also posterior tibial tendinitis, plantar fasciitis; sesamoiditis  -Treatment options discussed including all alternatives, risks, and complications .mwte -X-rays were obtained and reviewed with the patient.  Suspect during surgery.  No evidence of acute fracture. -We discussed orthotics.  She was measured for orthotics today by Liliane Channel. This should take pressure off of the arch, posterior tibial tendon as well as offloading the sesamoids. -Voltaren gel -We discussed shoe modifications -Stretching, icing daily.  Trula Slade DPM

## 2018-06-16 NOTE — Patient Instructions (Signed)

## 2018-06-24 ENCOUNTER — Other Ambulatory Visit: Payer: Self-pay | Admitting: Gastroenterology

## 2018-07-09 ENCOUNTER — Ambulatory Visit: Payer: Medicare Other | Admitting: Orthotics

## 2018-07-09 ENCOUNTER — Other Ambulatory Visit: Payer: Self-pay

## 2018-07-09 DIAGNOSIS — M722 Plantar fascial fibromatosis: Secondary | ICD-10-CM

## 2018-07-09 DIAGNOSIS — M25571 Pain in right ankle and joints of right foot: Secondary | ICD-10-CM

## 2018-07-09 NOTE — Progress Notes (Signed)
Patient came in today to pick up custom made foot orthotics.  The goals were accomplished and the patient reported no dissatisfaction with said orthotics.  Patient was advised of breakin period and how to report any issues. 

## 2018-07-30 ENCOUNTER — Ambulatory Visit: Payer: Medicare Other | Admitting: Podiatry

## 2018-08-13 ENCOUNTER — Ambulatory Visit: Payer: Medicare Other | Admitting: Podiatry

## 2018-08-21 ENCOUNTER — Ambulatory Visit: Payer: Medicare Other | Admitting: Podiatry

## 2018-08-21 ENCOUNTER — Encounter: Payer: Self-pay | Admitting: Podiatry

## 2018-08-21 ENCOUNTER — Other Ambulatory Visit: Payer: Self-pay

## 2018-08-21 VITALS — Temp 98.1°F

## 2018-08-21 DIAGNOSIS — M2142 Flat foot [pes planus] (acquired), left foot: Secondary | ICD-10-CM | POA: Diagnosis not present

## 2018-08-21 DIAGNOSIS — M722 Plantar fascial fibromatosis: Secondary | ICD-10-CM

## 2018-08-21 DIAGNOSIS — M2141 Flat foot [pes planus] (acquired), right foot: Secondary | ICD-10-CM | POA: Diagnosis not present

## 2018-08-27 NOTE — Progress Notes (Signed)
Subjective: 68 year old female presents the office to pick up shoes.  She also presents today for follow-up evaluation of bilateral foot pain.  She has no new concerns and she states that overall she is doing well. Denies any systemic complaints such as fevers, chills, nausea, vomiting. No acute changes since last appointment, and no other complaints at this time.   Objective: AAO x3, NAD DP/PT pulses palpable bilaterally, CRT less than 3 seconds There is decrease in medial arch upon weightbearing bilaterally left side worse than the right.  Subjectively she is doing discomfort on the medial band plantar fascia onto the foot.  No area pinpoint tenderness.  There is no edema, erythema bilaterally.  No open lesions or pre-ulcerative lesions.  No pain with calf compression, swelling, warmth, erythema  Assessment: Probable chronic foot pain, presents pick up shoes  Plan: -All treatment options discussed with the patient including all alternatives, risks, complications.  -Shoes were dispensed.  Debrided doing well.  Brace instructions were discussed.  Continue stretching, ice exercises daily.  She states that she is felt great. -Patient encouraged to call the office with any questions, concerns, change in symptoms.   Trula Slade DPM

## 2018-08-28 ENCOUNTER — Other Ambulatory Visit: Payer: Self-pay | Admitting: Gastroenterology

## 2018-09-17 ENCOUNTER — Other Ambulatory Visit: Payer: Self-pay | Admitting: Gastroenterology

## 2018-09-25 ENCOUNTER — Ambulatory Visit: Payer: Self-pay

## 2018-09-25 ENCOUNTER — Encounter: Payer: Self-pay | Admitting: Orthopedic Surgery

## 2018-09-25 ENCOUNTER — Ambulatory Visit (INDEPENDENT_AMBULATORY_CARE_PROVIDER_SITE_OTHER): Payer: BC Managed Care – PPO | Admitting: Orthopedic Surgery

## 2018-09-25 DIAGNOSIS — M180 Bilateral primary osteoarthritis of first carpometacarpal joints: Secondary | ICD-10-CM | POA: Diagnosis not present

## 2018-09-25 DIAGNOSIS — M79641 Pain in right hand: Secondary | ICD-10-CM | POA: Diagnosis not present

## 2018-09-25 DIAGNOSIS — M79642 Pain in left hand: Secondary | ICD-10-CM | POA: Diagnosis not present

## 2018-09-26 ENCOUNTER — Encounter: Payer: Self-pay | Admitting: Orthopedic Surgery

## 2018-09-26 DIAGNOSIS — M79641 Pain in right hand: Secondary | ICD-10-CM | POA: Diagnosis not present

## 2018-09-26 DIAGNOSIS — M180 Bilateral primary osteoarthritis of first carpometacarpal joints: Secondary | ICD-10-CM | POA: Diagnosis not present

## 2018-09-26 MED ORDER — BUPIVACAINE HCL 0.25 % IJ SOLN
0.3300 mL | INTRAMUSCULAR | Status: AC | PRN
  Administered 2018-09-26: .33 mL via INTRA_ARTICULAR

## 2018-09-26 MED ORDER — LIDOCAINE HCL 1 % IJ SOLN
3.0000 mL | INTRAMUSCULAR | Status: AC | PRN
  Administered 2018-09-26: 3 mL

## 2018-09-26 MED ORDER — METHYLPREDNISOLONE ACETATE 40 MG/ML IJ SUSP
13.3300 mg | INTRAMUSCULAR | Status: AC | PRN
  Administered 2018-09-26: 13.33 mg via INTRA_ARTICULAR

## 2018-09-26 NOTE — Progress Notes (Signed)
Office Visit Note   Patient: Joann [name]           Date of Birth: Nov 28, 1950           MRN: [id number] Visit Date: 09/25/2018 Requested by: Vidal Schwalbe, MD [address] [place],   [address] PCP: Vidal Schwalbe, MD  Subjective: Chief Complaint  Patient presents with  . Right Hand - Pain  . Left Hand - Pain    HPI: [name] is a patient with bilateral thumb pain.  She has had CMC injections in the past and request them again today.  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  She reports decreased pin strength and decreased grip strength.              ROS: All systems reviewed are negative as they relate to the chief complaint within the history of present illness.  Patient denies  fevers or chills.   Assessment & Plan: Visit Diagnoses:  1. Pain in right hand   2. Pain in left hand     Plan: Impression is bilateral CMC arthritis.  Plan is bilateral CMC injections under fluoroscopy.  Would want to do more than 2 to 3 injections/year.  We discussed CMC arthroplasty in the past.  Follow-up with me as needed.  Follow-Up Instructions: Return if symptoms worsen or fail to improve.   Orders:  Orders Placed This Encounter  Procedures  . XR C-ARM NO REPORT   No orders of the defined types were placed in this encounter.     Procedures: Small Joint Inj: bilateral thumb MCP on 09/26/2018 10:51 AM Indications: pain Details: 25 G needle, fluoroscopy-guided radial approach  Spinal Needle: No  Medications (Right): 3 mL lidocaine 1 %; 0.33 mL bupivacaine 0.25 %; 13.33 mg methylPREDNISolone acetate 40 MG/ML Medications (Left): 3 mL lidocaine 1 %; 0.33 mL bupivacaine 0.25 %; 13.33 mg methylPREDNISolone acetate 40 MG/ML Outcome: tolerated well, no immediate complications Procedure, treatment alternatives, risks and benefits explained, specific risks discussed. Consent was given by the patient. Immediately prior to procedure a time out was called to  verify the correct patient, procedure, equipment, support staff and site/side marked as required. Patient was prepped and draped in the usual sterile fashion.       Clinical Data: No additional findings.  Objective: Vital Signs: There were no vitals taken for this visit.  Physical Exam:   Constitutional: Patient appears well-developed HEENT:  Head: Normocephalic Eyes:EOM are normal Neck: Normal range of motion Cardiovascular: Normal rate Pulmonary/chest: Effort normal Neurologic: Patient is alert Skin: Skin is warm Psychiatric: Patient has normal mood and affect    Ortho Exam: Ortho exam demonstrates full active and passive range of motion of the wrist.  She does have positive grind test bilaterally with intact EPL FPL function.  No masses lymphadenopathy or skin changes noted in that thumb region.  Grip strength is intact.  Radial pulses intact.  Specialty Comments:  No specialty comments available.  Imaging: No results found.   PMFS History: Patient Active Problem List   Diagnosis Date Noted  . Right upper quadrant pain 08/21/2017  . Nausea without vomiting 08/21/2017  . Hyperlipidemia 01/01/2017  . Pre-diabetes 01/01/2017  . Shortness of breath 01/01/2017  . Palpitations 12/22/2016  . Chest pain 12/19/2016  . Hemorrhoids 12/19/2016  . Ulcerative colitis ([place]) 12/19/2016   Past Medical History:  Diagnosis Date  . Chronic diarrhea    due to IBS  . Dyslipidemia   .  GERD (gastroesophageal reflux disease)   . Hiatal hernia   . History of palpitations   . History of skin cancer    nose  . IBS (irritable bowel syndrome)   . OA (osteoarthritis)    knees and lower back  . OAB (overactive bladder)   . Prolapsed internal hemorrhoids, grade 3   . Wears contact lenses   . Wears dentures    UPPER    Family History  Problem Relation Age of Onset  . Alzheimer's disease Mother   . Colon cancer Neg Hx   . Stomach cancer Neg Hx   . Rectal cancer Neg Hx   .  Esophageal cancer Neg Hx   . Liver cancer Neg Hx     Past Surgical History:  Procedure Laterality Date  . ABDOMINAL HYSTERECTOMY  1996    W/ BSO  . BREAST ENHANCEMENT SURGERY  1985  . BUNIONECTOMY WITH HAMMERTOE RECONSTRUCTION Right 07-08-2014    @ [facility]   1st toe bunionectomy;  2nd toe hammertoe correction  . CARDIOVASCULAR STRESS TEST  12-20-2016    [facility] Cardiology , care everywhere in epic   norma nuclear perfusion study w/ no ischemia/  normal LV function and wall motion , ef 71%  . COLONOSCOPY  07/2011   [facility]   . FOREIGN BODY REMOVAL Right 02/27/2016   Procedure: REMOVAL FOREIGN BODY EXTREMITY;  Surgeon: Meredith Pel, MD;  Location: [place];  Service: Orthopedics;  Laterality: Right;  . HEMORRHOID SURGERY N/A 09/05/2017   Procedure: TWO COLUMN HEMORRHOIDECTOMY, HEMROIDPEXY;  Surgeon: Leighton Ruff, MD;  Location: [facility];  Service: General;  Laterality: N/A;  . KNEE ARTHROPLASTY  01/15/2011   Procedure: COMPUTER ASSISTED TOTAL KNEE ARTHROPLASTY;  Surgeon: Meredith Pel;  Location: [place];  Service: Orthopedics;  Laterality: Right;  Right total knee arthroplasty  . KNEE ARTHROSCOPY Left 12/2009  . NASAL SINUS SURGERY  2017  . TONSILLECTOMY  age 68  . TOTAL KNEE ARTHROPLASTY Left 11-27-2010    dr dean   [facility]   Social History   Occupational History  . Occupation: court reporter  Tobacco Use  . Smoking status: Former Smoker    Packs/day: 0.50    Years: 16.00    Pack years: 8.00    Types: Cigarettes    Quit date: 01/11/1991    Years since quitting: 27.7  . Smokeless tobacco: Never Used  Substance and Sexual Activity  . Alcohol use: No  . Drug use: No  . Sexual activity: Yes    Birth control/protection: Surgical

## 2018-09-28 ENCOUNTER — Ambulatory Visit: Payer: BC Managed Care – PPO | Admitting: Orthopedic Surgery

## 2018-10-04 IMAGING — CT CT ABD-PELV W/ CM
2 of 5 series · 17 of 46 positions shown, 19 images · IV contrast (ISOVUE 300)
Comparison: None.

CLINICAL DATA: Right upper quadrant pain for 5 days.  Nausea.

EXAM:
CT ABDOMEN AND PELVIS WITH CONTRAST
TECHNIQUE: Multidetector CT imaging of the abdomen and pelvis was performed
using the standard protocol following bolus administration of
intravenous contrast.
CONTRAST:  100mL QNYS3F-B55 IOPAMIDOL (QNYS3F-B55) INJECTION 61%

[Series 2: abd/pel w · axial · 0.79mm/px · z∈[+910,+1305]mm · 14 of 89 slices shown, 16 images]
[im 5/89  soft-tissue]
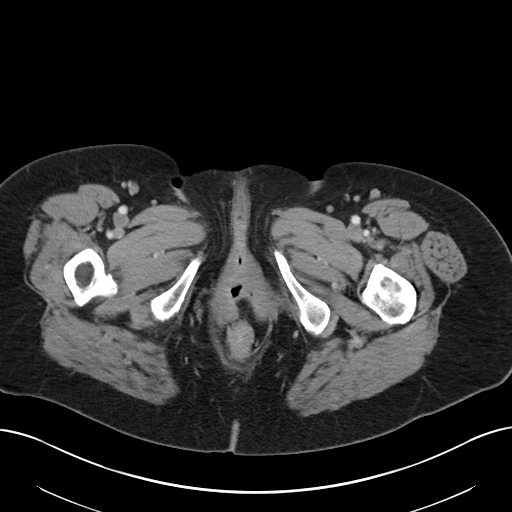
[im 5/89  bone]
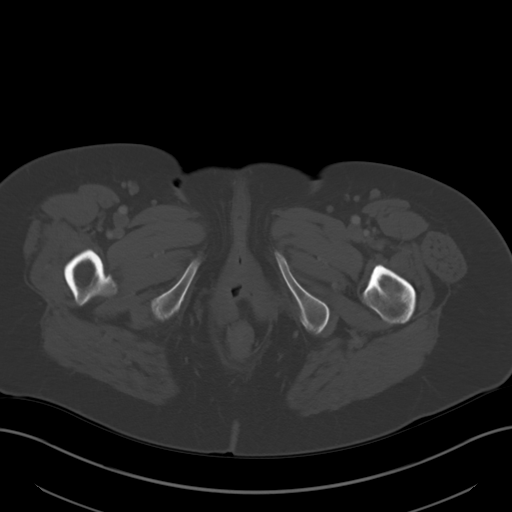
[im 10/89  soft-tissue]
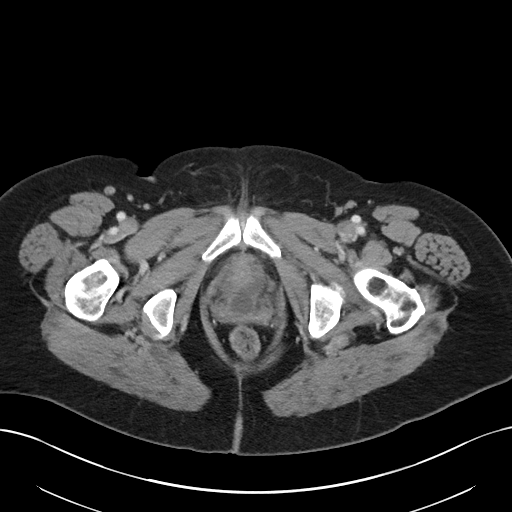
[im 19/89  soft-tissue]
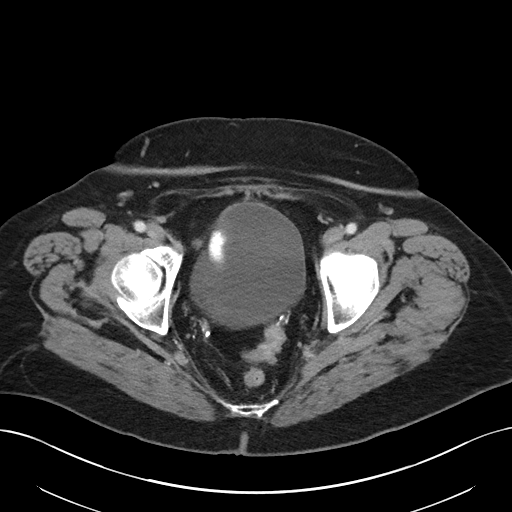
[im 24/89  soft-tissue]
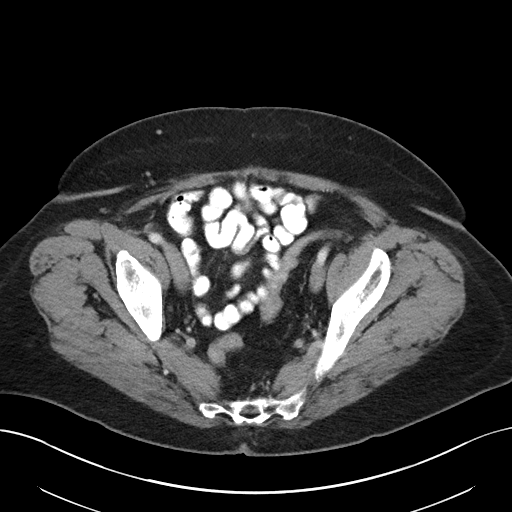
[im 28/89  soft-tissue]
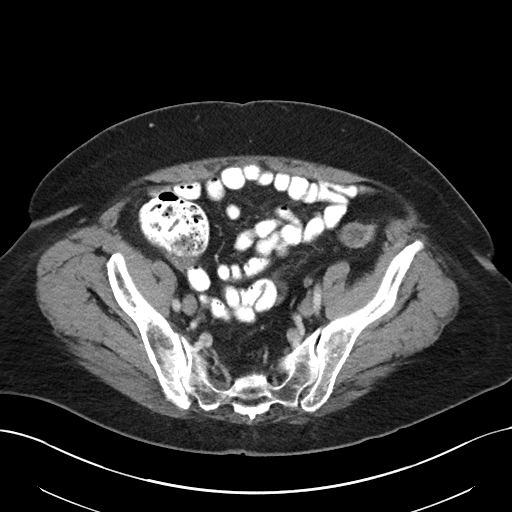
[im 38/89  soft-tissue]
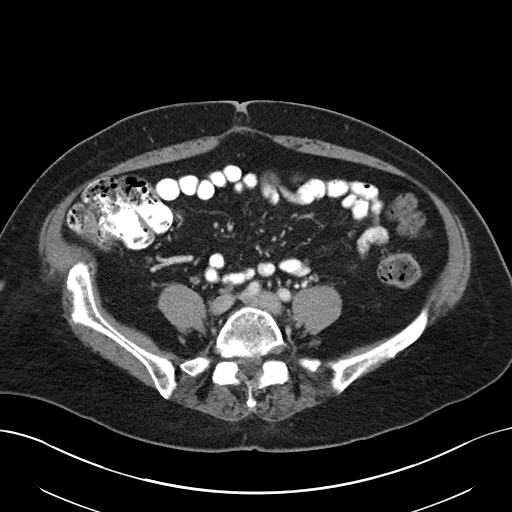
[im 42/89  soft-tissue]
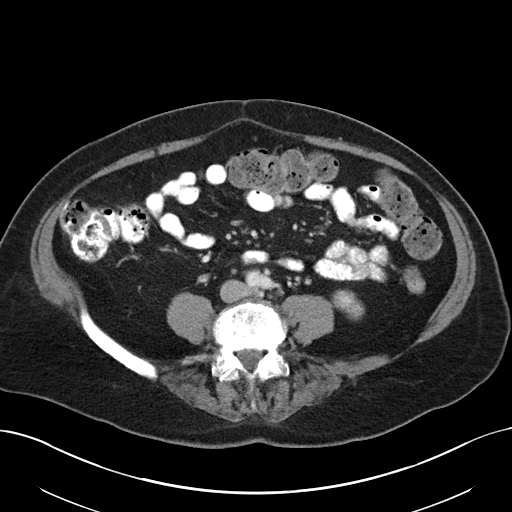
[im 47/89  soft-tissue]
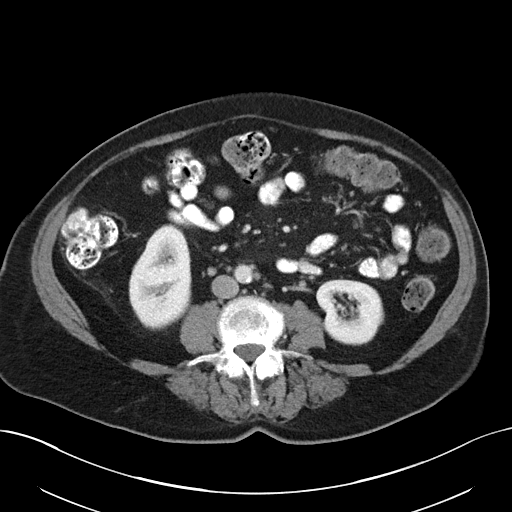
[im 51/89  soft-tissue]
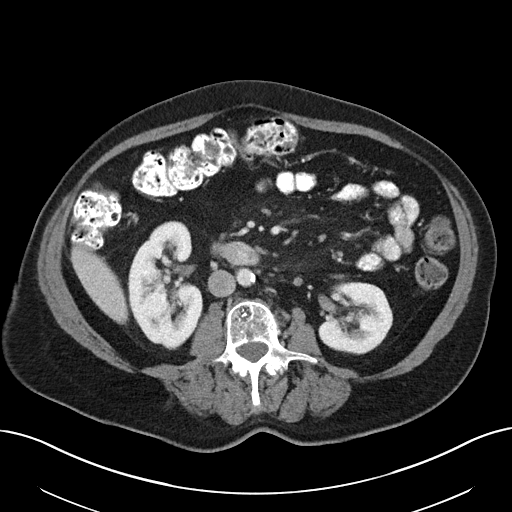
[im 51/89  bone]
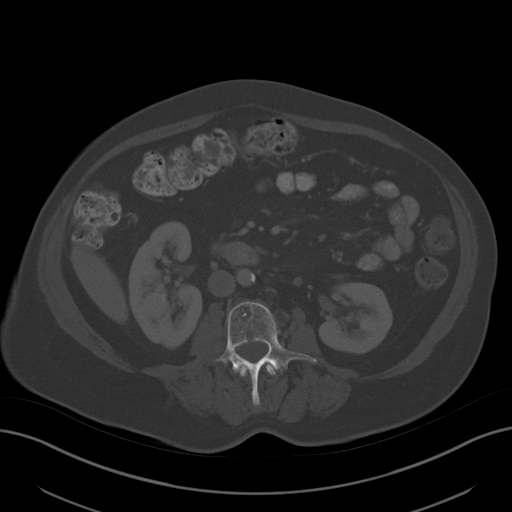
[im 61/89  soft-tissue]
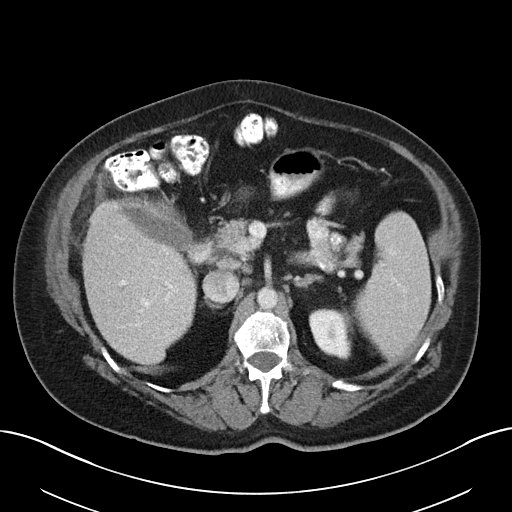
[im 65/89  soft-tissue]
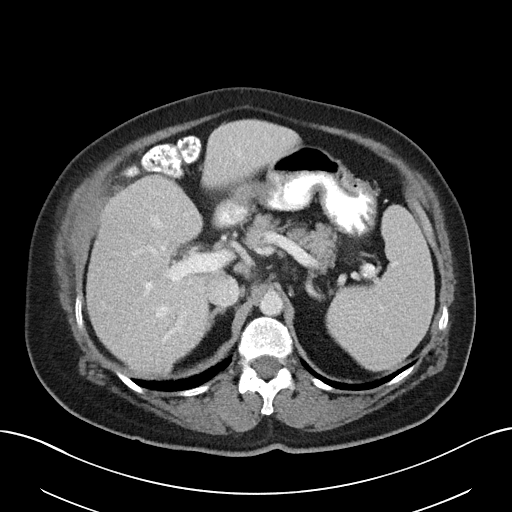
[im 70/89  soft-tissue]
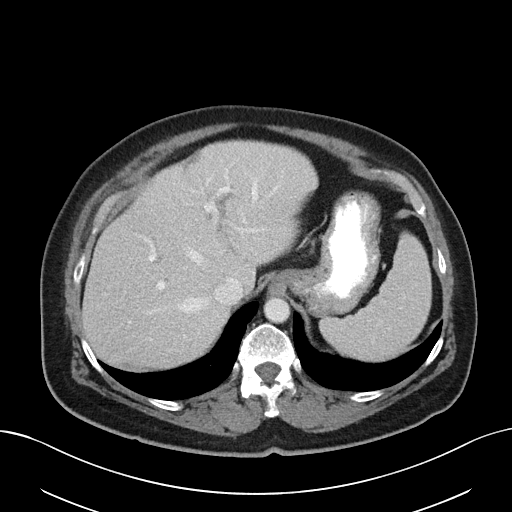
[im 79/89  soft-tissue]
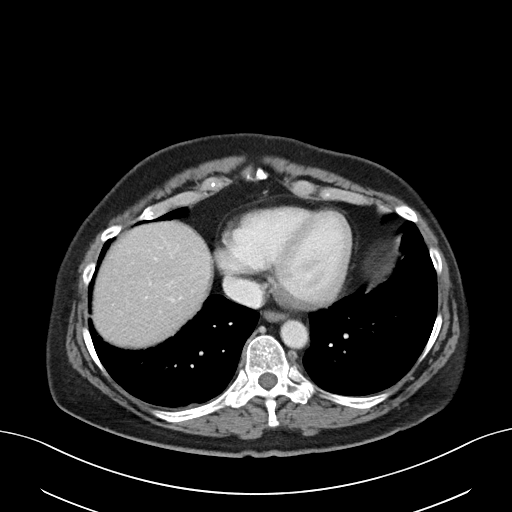
[im 84/89  soft-tissue]
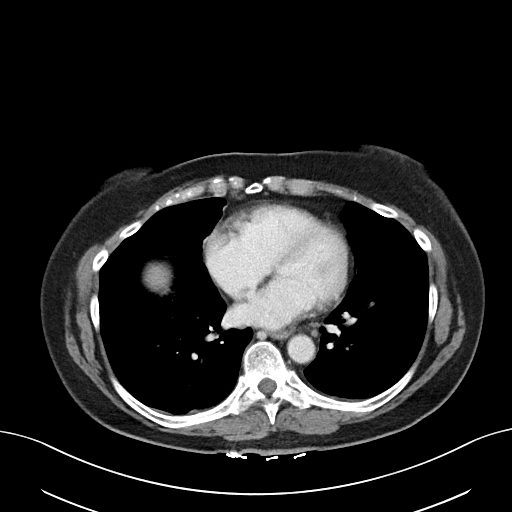

[Series 5: abd/pel w st · coronal · 0.82mm/px · 3 of 97 slices shown]
[im 33/97  soft-tissue]
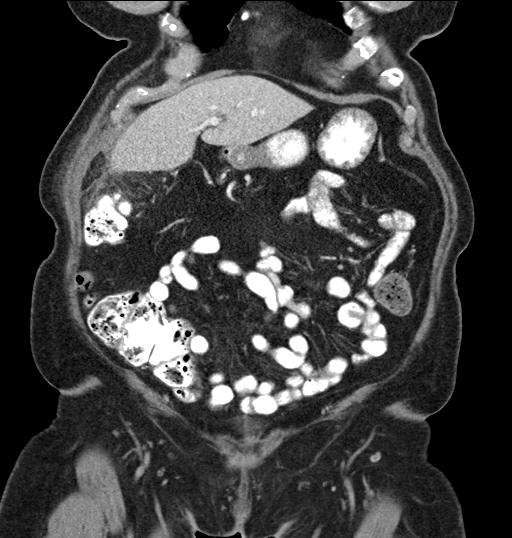
[im 43/97  soft-tissue]
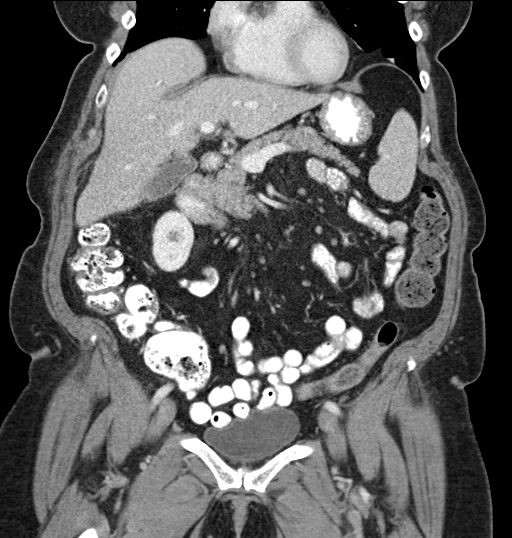
[im 54/97  soft-tissue]
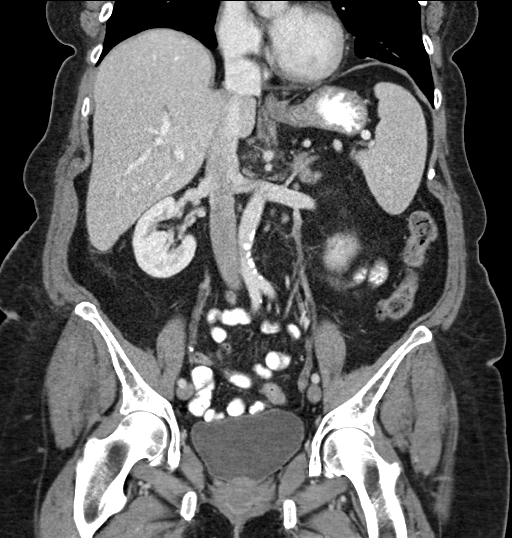

[17 of 46 positions shown; findings below may reference images not displayed]

FINDINGS: Lower Chest: No acute findings.

Hepatobiliary: No hepatic masses identified. Gallbladder is
unremarkable.

Pancreas:  No mass or inflammatory changes.

Spleen: Within normal limits in size and appearance.

Adrenals/Urinary Tract: No masses identified. No evidence of
hydronephrosis.

Stomach/Bowel: No evidence of bowel obstruction, wall thickening, or
abnormal fluid collections. Soft tissue stranding is seen within the
pericolonic fat along the lateral aspect of the hepatic flexure of
colon, consistent with epiploic appendagitis.

Vascular/Lymphatic: No pathologically enlarged lymph nodes. No
abdominal aortic aneurysm. Aortic atherosclerosis.

Reproductive: Prior hysterectomy noted. Adnexal regions are
unremarkable in appearance.

Other:  None.

Musculoskeletal:  No suspicious bone lesions identified.
IMPRESSION: Epiploic appendagitis in the right upper quadrant, adjacent to the
hepatic flexure of the colon.

## 2018-11-09 ENCOUNTER — Other Ambulatory Visit: Payer: Self-pay | Admitting: Gastroenterology

## 2019-02-15 ENCOUNTER — Other Ambulatory Visit: Payer: Self-pay

## 2019-02-15 ENCOUNTER — Ambulatory Visit: Payer: Medicare HMO | Admitting: Podiatry

## 2019-02-15 ENCOUNTER — Ambulatory Visit (INDEPENDENT_AMBULATORY_CARE_PROVIDER_SITE_OTHER): Payer: Medicare HMO

## 2019-02-15 DIAGNOSIS — M779 Enthesopathy, unspecified: Secondary | ICD-10-CM

## 2019-02-15 DIAGNOSIS — M76822 Posterior tibial tendinitis, left leg: Secondary | ICD-10-CM

## 2019-02-15 DIAGNOSIS — M79672 Pain in left foot: Secondary | ICD-10-CM

## 2019-02-15 DIAGNOSIS — M2141 Flat foot [pes planus] (acquired), right foot: Secondary | ICD-10-CM

## 2019-02-15 DIAGNOSIS — M76829 Posterior tibial tendinitis, unspecified leg: Secondary | ICD-10-CM

## 2019-02-15 DIAGNOSIS — M2142 Flat foot [pes planus] (acquired), left foot: Secondary | ICD-10-CM

## 2019-02-15 MED ORDER — METHYLPREDNISOLONE 4 MG PO TBPK: ORAL_TABLET | ORAL | 0 refills | Status: DC

## 2019-02-15 NOTE — Progress Notes (Signed)
Subjective: 69 year old female presents the office today for concerns of pain to the medial aspect of her ankle but she causing swelling to the outside aspect.  She states this started after she was standing for a couple of hours while making cookies and the next day she noticed swelling and bruising.  Prior to that she was developing some pain to the top of her foot.  She is concerned that her foot may be collapsing in the arch.  Her right foot is been doing well. Denies any systemic complaints such as fevers, chills, nausea, vomiting. No acute changes since last appointment, and no other complaints at this time.   Objective: AAO x3, NAD DP/PT pulses palpable bilaterally, CRT less than 3 seconds Decrease in medial arch on the left side.  The left side there is tenderness palpation the course of the posterior tibial, flexor tendons posterior and inferior to the medial malleolus.  Mild discomfort as well as edema to the lateral aspect of foot on sinus tarsi.  She has been getting some discomfort to the dorsal aspect of foot this appears to be mostly on the fourth metatarsal cuneiform joint.  Mild discomfort to the posterior Achilles tendon but overall Thompson test is negative the Achilles tendon appears to be intact.  No open lesions or pre-ulcerative lesions.  No pain with calf compression, swelling, warmth, erythema  Assessment: Posterior tibial tendon dysfunction, tendinitis with subtalar joint capsulitis; midfoot arthritis  Plan: -All treatment options discussed with the patient including all alternatives, risks, complications.  -X-rays obtained reviewed.  Arthritic changes present of the fourth metatarsal cuboid joint.  Flatfoot is present.  There is no evidence of acute fracture identified today. -Recommend a Tri-Lock ankle brace.  Prescribed Medrol Dosepak.  Ice to the area as well.  As she starts to feel better discussed stretching, rehab exercises.  Long-term discussed shoe modifications and  orthotics. -Patient encouraged to call the office with any questions, concerns, change in symptoms.   Trula Slade DPM

## 2019-02-15 NOTE — Patient Instructions (Signed)

## 2019-02-16 ENCOUNTER — Other Ambulatory Visit: Payer: Self-pay | Admitting: Podiatry

## 2019-02-16 DIAGNOSIS — M76822 Posterior tibial tendinitis, left leg: Secondary | ICD-10-CM

## 2019-03-08 ENCOUNTER — Other Ambulatory Visit: Payer: Self-pay | Admitting: Gastroenterology

## 2019-03-15 ENCOUNTER — Ambulatory Visit: Payer: Medicare HMO | Admitting: Podiatry

## 2019-04-06 ENCOUNTER — Encounter: Payer: Self-pay | Admitting: Gastroenterology

## 2019-04-06 ENCOUNTER — Ambulatory Visit: Payer: Medicare HMO | Admitting: Gastroenterology

## 2019-04-06 VITALS — BP 135/82 | HR 90 | Temp 98.4°F | Ht 63.0 in | Wt 194.0 lb

## 2019-04-06 DIAGNOSIS — K580 Irritable bowel syndrome with diarrhea: Secondary | ICD-10-CM

## 2019-04-06 DIAGNOSIS — R12 Heartburn: Secondary | ICD-10-CM | POA: Diagnosis not present

## 2019-04-06 MED ORDER — HYOSCYAMINE SULFATE 0.125 MG SL SUBL: 0.1250 mg | SUBLINGUAL_TABLET | Freq: Four times a day (QID) | SUBLINGUAL | 1 refills | Status: DC | PRN

## 2019-04-06 NOTE — Progress Notes (Signed)
     [facility] Progress Note  Chief Complaint: IBS  Subjective  History: IBS, last seen June 2019.  Colonoscopy and biopsies did not show microscopic colitis.  However, she previously reported significant symptom improvement taking her daughter's mesalamine.  Therefore I prescribed her some in case she had milder patchy microscopic colitis not evident on biopsies.  My clinical suspicion was still primarily IBS.  She had previously tried Viberzi, once daily was somewhat helpful but twice daily cause constipation.  Prolapsing internal hemorrhoids when last seen, underwent 2 column hemorrhoidectomy by Dr. Marcello Moores in August 2019. Chronic heartburn, globus sensation when last seen in clinic.  EGD normal June 2019.  I felt that allergic rhinitis and postnasal drip were contributing to the throat symptoms.  Jenny is feeling fairly well these days.  She still has intermittent pyrosis that comes and goes in severity.  She has intermittent lower abdominal cramps with urgency and loose stool, worse under periods of stress.  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 Hemorrhoid surgery relieve the prolapse and bleeding.  ROS: Cardiovascular:  no chest pain Respiratory: no dyspnea  The patient's Past Medical, Family and Social History were reviewed and are on file in the EMR.  Objective:  Med list reviewed  Current Outpatient Medications:  .  acetaminophen (TYLENOL) 500 MG tablet, Take 500 mg by mouth every 6 (six) hours as needed., Disp: , Rfl:  .  hyoscyamine (LEVSIN SL) 0.125 MG SL tablet, Take 1 tablet (0.125 mg total) by mouth every 6 (six) hours as needed., Disp: 90 tablet, Rfl: 1 .  omeprazole (PRILOSEC OTC) 20 MG tablet, Take 20 mg by mouth daily as needed (for acid reflux/hearburn.)., Disp: , Rfl:  .  Soft Lens Products (REWETTING DROPS) SOLN, Apply 1 drop to eye daily as needed (for  dry contact lenses)., Disp: , Rfl:    Vital signs in last 24 hrs: Vitals:   04/06/19 1310  BP: 135/82  Pulse: 90  Temp: 98.4 F (36.9 C)    Physical Exam  Well-appearing  HEENT: sclera anicteric, oral mucosa moist without lesions  Neck: supple, no thyromegaly, JVD or lymphadenopathy  Cardiac: RRR without murmurs, S1S2 heard, no peripheral edema  Pulm: clear to auscultation bilaterally, normal RR and effort noted  Abdomen: soft, no tenderness, with active bowel sounds. No guarding or palpable hepatosplenomegaly.  Labs:   ___________________________________________ Radiologic studies:   ____________________________________________ Other:   _____________________________________________ Assessment & Plan  Assessment: Encounter Diagnoses  Name Primary?  . Irritable bowel syndrome with diarrhea Yes  . Heartburn    I suspect there may be a functional element to the heartburn.  IBS lately stable, takes Levsin as needed.   Plan: Refilled Levsin, do not think she needs mesalamine. Follow-up in a year or sooner as needed   20 minutes were spent on this encounter (including chart review, history/exam, counseling/coordination of care, and documentation)  Joann [name]

## 2019-04-06 NOTE — Patient Instructions (Signed)
If you are age 71 or older, your body mass index should be between 23-30. Your Body mass index is 34.37 kg/m. If this is out of the aforementioned range listed, please consider follow up with your Primary Care Provider.  If you are age 58 or younger, your body mass index should be between 19-25. Your Body mass index is 34.37 kg/m. If this is out of the aformentioned range listed, please consider follow up with your Primary Care Provider.   Follow up in a year.   It was a pleasure to see you today!  Dr. Loletha Carrow

## 2019-07-06 ENCOUNTER — Other Ambulatory Visit: Payer: Self-pay

## 2019-07-06 ENCOUNTER — Encounter: Payer: Self-pay | Admitting: Podiatry

## 2019-07-06 ENCOUNTER — Ambulatory Visit: Payer: Medicare HMO | Admitting: Podiatry

## 2019-07-06 DIAGNOSIS — M2142 Flat foot [pes planus] (acquired), left foot: Secondary | ICD-10-CM

## 2019-07-06 DIAGNOSIS — M2141 Flat foot [pes planus] (acquired), right foot: Secondary | ICD-10-CM | POA: Diagnosis not present

## 2019-07-06 DIAGNOSIS — M76829 Posterior tibial tendinitis, unspecified leg: Secondary | ICD-10-CM

## 2019-07-08 ENCOUNTER — Telehealth: Payer: Self-pay | Admitting: *Deleted

## 2019-07-08 DIAGNOSIS — M779 Enthesopathy, unspecified: Secondary | ICD-10-CM

## 2019-07-08 DIAGNOSIS — M76829 Posterior tibial tendinitis, unspecified leg: Secondary | ICD-10-CM

## 2019-07-08 NOTE — Telephone Encounter (Signed)
-----   Message from Trula Slade, DPM sent at 07/06/2019  4:11 PM EDT ----- Can you please order an MRI of the left ankle to evaluate posterior tibial tendon tear? Thanks.   She would like this done ASAP

## 2019-07-11 NOTE — Progress Notes (Signed)
Subjective: 69 year old female presents the office today for concerns of worsening pain and swelling to the medial aspect the left ankle.  Have not seen her since January and she did complete the course of the Medrol Dosepak and using a Tri-Lock ankle brace but this was not helpful apparently.  She states that over the last several months her symptoms have continued to worsen.  She has tried multiple over-the-counter treatments including CBD, Aleve.  She is also been using her husband's tramadol which advised her not to do.  I offered her a prescription today but she declined.  Has a history of right foot reconstruction for flatfoot deformity.  Denies any systemic complaints such as fevers, chills, nausea, vomiting. No acute changes since last appointment, and no other complaints at this time.   Objective: AAO x3, NAD DP/PT pulses palpable bilaterally, CRT less than 3 seconds Tenderness palpation the course of the posterior tibial tendon there is edema localized to this area.  Flexor, extensor tendons appear to be intact.  There is no area of pinpoint tenderness.  Decreased medial arch upon weightbearing. No pain with calf compression, swelling, warmth, erythema  Assessment: Left foot posterior tibial tendon dysfunction, rule out tear  Plan: -All treatment options discussed with the patient including all alternatives, risks, complications.  -We discussed other options in regards to medications to help alleviate her symptoms.  She states that she has tried multiple things without improvement.  At this point will order an MRI to further evaluate the tendon to rule out tear. -Patient encouraged to call the office with any questions, concerns, change in symptoms.   Trula Slade DPM

## 2019-07-12 NOTE — Telephone Encounter (Signed)
done

## 2019-07-16 ENCOUNTER — Ambulatory Visit: Payer: Medicare HMO | Admitting: Orthopedic Surgery

## 2019-07-21 ENCOUNTER — Ambulatory Visit: Payer: Medicare HMO | Admitting: Orthopedic Surgery

## 2019-07-21 ENCOUNTER — Encounter: Payer: Self-pay | Admitting: Orthopedic Surgery

## 2019-07-21 DIAGNOSIS — M25571 Pain in right ankle and joints of right foot: Secondary | ICD-10-CM

## 2019-07-21 DIAGNOSIS — M2142 Flat foot [pes planus] (acquired), left foot: Secondary | ICD-10-CM

## 2019-07-21 NOTE — Progress Notes (Signed)
Office Visit Note   Patient: Joann [name]           Date of Birth: 1950-12-15           MRN: [id number] Visit Date: 07/21/2019 Requested by: Vidal Schwalbe, MD [address] [place],  [place] [address] PCP: Vidal Schwalbe, MD  Subjective: Chief Complaint  Patient presents with  . Left Ankle - Pain    HPI: Joann [name] is a patient with left ankle pain.  Denies any history of injury.  Had pain for 2 months.  Reports both medial and lateral sided pain.  She has seen a podiatrist who has ordered MRI scan of that ankle.  She has tried appropriate orthotics for pes planus and that has made her foot hurt worse.  Describes swelling weakness and giving way.  She did have right ankle surgery 10 years ago which helped her significantly.  Her left ankle is worse than the right ankle was when it had surgery.              ROS: All systems reviewed are negative as they relate to the chief complaint within the history of present illness.  Patient denies  fevers or chills.   Assessment & Plan: Visit Diagnoses:  1. 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  She does have collapse of the midfoot when standing.  I think this is a surgical problem.  Starting to make her left knee hurt which has been replaced.  In general although she has flexible subtalar deformity it is unclear whether or not this is good to be a soft tissue procedure or a fusion procedure.  I think the MRI scan will help delineate the true amount of subtalar arthritis present as well as the status of the posterior tib tendon.  I advised her to follow-up with Dr. Sharol Given after the MRI scan on July 14 so she can compare recommendations in that regard.  Follow-Up Instructions: No follow-ups on file.   Orders:  No orders of the defined types were placed in this encounter.  No orders of the defined types were placed in this encounter.     Procedures: No procedures  performed   Clinical Data: No additional findings.  Objective: Vital Signs: There were no vitals taken for this visit.  Physical Exam:   Constitutional: Patient appears well-developed HEENT:  Head: Normocephalic Eyes:EOM are normal Neck: Normal range of motion Cardiovascular: Normal rate Pulmonary/chest: Effort normal Neurologic: Patient is alert Skin: Skin is warm Psychiatric: Patient has normal mood and affect    Ortho Exam: Ortho exam demonstrates collapse of the midfoot on the left-hand side.  Right foot arch is reasonably well-maintained.  Pedal pulses palpable on the left.  She does have good inversion eversion dorsiflexion plantarflexion strength with palpable intact anterior to posterior to peroneal and Achilles tendons.  The posterior tib tendon is tender.  She does have symmetric subtalar motion left versus right as well as transverse tarsal motion left versus right.  Does not have a lot of heel inversion when she stands on her toes.  Specialty Comments:  No specialty comments available.  Imaging: No results found.   PMFS History: Patient Active Problem List   Diagnosis Date Noted  . Right upper quadrant pain 08/21/2017  . Nausea without vomiting 08/21/2017  . Hyperlipidemia 01/01/2017  . Pre-diabetes 01/01/2017  . Shortness of breath 01/01/2017  . Palpitations 12/22/2016  .  Chest pain 12/19/2016  . Hemorrhoids 12/19/2016  . Ulcerative colitis ([place]) 12/19/2016   Past Medical History:  Diagnosis Date  . Chronic diarrhea    due to IBS  . Dyslipidemia   . GERD (gastroesophageal reflux disease)   . Hiatal hernia   . History of palpitations   . History of skin cancer    nose  . IBS (irritable bowel syndrome)   . OA (osteoarthritis)    knees and lower back  . OAB (overactive bladder)   . Prolapsed internal hemorrhoids, grade 3   . Wears contact lenses   . Wears dentures    UPPER    Family History  Problem Relation Age of Onset  . Alzheimer's  disease Mother   . Colon cancer Neg Hx   . Stomach cancer Neg Hx   . Rectal cancer Neg Hx   . Esophageal cancer Neg Hx   . Liver cancer Neg Hx     Past Surgical History:  Procedure Laterality Date  . ABDOMINAL HYSTERECTOMY  1996    W/ BSO  . BREAST ENHANCEMENT SURGERY  1985  . BUNIONECTOMY WITH HAMMERTOE RECONSTRUCTION Right 07-08-2014    @ [facility]   1st toe bunionectomy;  2nd toe hammertoe correction  . CARDIOVASCULAR STRESS TEST  12-20-2016    [facility] , care everywhere in epic   norma nuclear perfusion study w/ no ischemia/  normal LV function and wall motion , ef 71%  . COLONOSCOPY  07/2011   [facility]   . FOREIGN BODY REMOVAL Right 02/27/2016   Procedure: REMOVAL FOREIGN BODY EXTREMITY;  Surgeon: Meredith Pel, MD;  Location: [facility];  Service: Orthopedics;  Laterality: Right;  . HEMORRHOID SURGERY N/A 09/05/2017   Procedure: TWO COLUMN HEMORRHOIDECTOMY, HEMROIDPEXY;  Surgeon: Leighton Ruff, MD;  Location: [facility];  Service: General;  Laterality: N/A;  . KNEE ARTHROPLASTY  01/15/2011   Procedure: COMPUTER ASSISTED TOTAL KNEE ARTHROPLASTY;  Surgeon: Meredith Pel;  Location: [facility];  Service: Orthopedics;  Laterality: Right;  Right total knee arthroplasty  . KNEE ARTHROSCOPY Left 12/2009  . NASAL SINUS SURGERY  2017  . TONSILLECTOMY  age 3  . TOTAL KNEE ARTHROPLASTY Left 11-27-2010    dr Shamecca Whitebread   [facility]   Social History   Occupational History  . Occupation: court reporter  Tobacco Use  . Smoking status: Former Smoker    Packs/day: 0.50    Years: 16.00    Pack years: 8.00    Types: Cigarettes    Quit date: 01/11/1991    Years since quitting: 28.5  . Smokeless tobacco: Never Used  Vaping Use  . Vaping Use: Never used  Substance and Sexual Activity  . Alcohol use: No  . Drug use: No  . Sexual activity: Yes    Birth control/protection: Surgical

## 2019-08-09 ENCOUNTER — Other Ambulatory Visit: Payer: Self-pay | Admitting: Podiatry

## 2019-08-11 ENCOUNTER — Other Ambulatory Visit: Payer: Self-pay

## 2019-08-11 ENCOUNTER — Ambulatory Visit
Admission: RE | Admit: 2019-08-11 | Discharge: 2019-08-11 | Disposition: A | Discharge status: Home / Self Care | Payer: Medicare HMO | Source: Ambulatory Visit | Attending: Podiatry | Admitting: Podiatry

## 2019-08-12 ENCOUNTER — Telehealth: Payer: Self-pay

## 2019-08-12 NOTE — Telephone Encounter (Signed)
Patient called in saying dr dean has to call  Dr Jacqualyn Posey [facility]) to get mri results before her upcoming appt.

## 2019-08-12 NOTE — Telephone Encounter (Signed)
We are able to access images through Epic/Intelerad.

## 2019-08-16 ENCOUNTER — Ambulatory Visit: Payer: Medicare HMO | Admitting: Orthopedic Surgery

## 2019-08-16 ENCOUNTER — Other Ambulatory Visit: Payer: Self-pay

## 2019-08-16 ENCOUNTER — Encounter: Payer: Self-pay | Admitting: Orthopedic Surgery

## 2019-08-16 VITALS — Ht 63.0 in | Wt 194.0 lb

## 2019-08-16 DIAGNOSIS — M76822 Posterior tibial tendinitis, left leg: Secondary | ICD-10-CM

## 2019-08-16 DIAGNOSIS — M25872 Other specified joint disorders, left ankle and foot: Secondary | ICD-10-CM

## 2019-08-16 NOTE — Progress Notes (Signed)
Office Visit Note   Patient: Joann [name]           Date of Birth: Apr 11, 1950           MRN: [id number] Visit Date: 08/16/2019              Requested by: Vidal Schwalbe, MD [address] [place],  [place] [address] PCP: Vidal Schwalbe, MD  Chief Complaint  Patient presents with  . Left Ankle - Follow-up    MRI review 08/11/2019      HPI: Patient is a 69 year old woman who presents in follow-up status post MRI scan left ankle.  Patient complains of pain primarily over the anterior medial joint line.  Patient is status post a left total knee.  Patient states that she did have posterior tibial tendon reconstruction of the right ankle about 11 years ago she states she has had 4 surgeries on the right foot.  Patient states that on the left foot she has tried using orthotics and bracing from her podiatrist Dr. Earleen Newport.  Patient states that this seemed to make her pronation valgus more painful.  Assessment & Plan: Visit Diagnoses:  1. Impingement syndrome of left ankle   2. Posterior tibial tendon dysfunction (PTTD) of left lower extremity     Plan: Patient would like to proceed with left ankle arthroscopy and debridement for the osteochondral defect of the medial talar dome.  Risks and benefits were discussed including persistent pain risk of infection potential need for fusion for the posterior tibial tendon insufficiency.  Follow-Up Instructions: Return if symptoms worsen or fail to improve.   Ortho Exam  Patient is alert, oriented, no adenopathy, well-dressed, normal affect, normal respiratory effort. Examination patient has good pulses she does have an antalgic gait.  She does have pronation valgus of the left foot from her posterior tibial tendon insufficiency however she has no tenderness to palpation over the posterior tibial tendon MRI scan shows no inflammation along the posterior tibial tendon.  She cannot do a single limb heel raise.  Patient is maximally tender to  palpation over the left anterior medial joint line.  Review of the MRI scan shows intact tendons around the ankle it does show a osteochondral defect of the medial talar dome with cartilage thinning.  Imaging: No results found. No images are attached to the encounter.  Labs: Lab Results  Component Value Date   REPTSTATUS 01/12/2011 FINAL 01/11/2011   CULT NO GROWTH 01/11/2011     No results found for: ALBUMIN, PREALBUMIN, LABURIC  No results found for: MG No results found for: VD25OH  No results found for: PREALBUMIN CBC EXTENDED Latest Ref Rng & Units 02/23/2016 01/18/2011 01/17/2011  WBC 4.0 - 10.5 K/uL 7.6 6.1 6.7  RBC 3.87 - 5.11 MIL/uL 4.64 3.47(L) 3.58(L)  HGB 12.0 - 15.0 g/dL 13.7 9.9(L) 9.8(L)  HCT 36 - 46 % 41.7 30.0(L) 30.8(L)  PLT 150 - 400 K/uL 254 256 252  NEUTROABS 1.7 - 7.7 K/uL - - -  LYMPHSABS 0.7 - 4.0 K/uL - - -     Body mass index is 34.37 kg/m.  Orders:  No orders of the defined types were placed in this encounter.  No orders of the defined types were placed in this encounter.    Procedures: No procedures performed  Clinical Data: No additional findings.  ROS:  All other systems negative, except as noted in the HPI. Review of Systems  Objective: Vital Signs: Ht 5' 3"  (1.6 m)  Wt 194 lb (88 kg)   BMI 34.37 kg/m   Specialty Comments:  No specialty comments available.  PMFS History: Patient Active Problem List   Diagnosis Date Noted  . Right upper quadrant pain 08/21/2017  . Nausea without vomiting 08/21/2017  . Hyperlipidemia 01/01/2017  . Pre-diabetes 01/01/2017  . Shortness of breath 01/01/2017  . Palpitations 12/22/2016  . Chest pain 12/19/2016  . Hemorrhoids 12/19/2016  . Ulcerative colitis ([place]) 12/19/2016   Past Medical History:  Diagnosis Date  . Chronic diarrhea    due to IBS  . Dyslipidemia   . GERD (gastroesophageal reflux disease)   . Hiatal hernia   . History of palpitations   . History of skin cancer     nose  . IBS (irritable bowel syndrome)   . OA (osteoarthritis)    knees and lower back  . OAB (overactive bladder)   . Prolapsed internal hemorrhoids, grade 3   . Wears contact lenses   . Wears dentures    UPPER    Family History  Problem Relation Age of Onset  . Alzheimer's disease Mother   . Colon cancer Neg Hx   . Stomach cancer Neg Hx   . Rectal cancer Neg Hx   . Esophageal cancer Neg Hx   . Liver cancer Neg Hx     Past Surgical History:  Procedure Laterality Date  . ABDOMINAL HYSTERECTOMY  1996    W/ BSO  . BREAST ENHANCEMENT SURGERY  1985  . BUNIONECTOMY WITH HAMMERTOE RECONSTRUCTION Right 07-08-2014    @ [facility]   1st toe bunionectomy;  2nd toe hammertoe correction  . CARDIOVASCULAR STRESS TEST  12-20-2016    [facility] , care everywhere in epic   norma nuclear perfusion study w/ no ischemia/  normal LV function and wall motion , ef 71%  . COLONOSCOPY  07/2011   [facility]   . FOREIGN BODY REMOVAL Right 02/27/2016   Procedure: REMOVAL FOREIGN BODY EXTREMITY;  Surgeon: Meredith Pel, MD;  Location: [facility];  Service: Orthopedics;  Laterality: Right;  . HEMORRHOID SURGERY N/A 09/05/2017   Procedure: TWO COLUMN HEMORRHOIDECTOMY, HEMROIDPEXY;  Surgeon: Leighton Ruff, MD;  Location[facility];  Service: General;  Laterality: N/A;  . KNEE ARTHROPLASTY  01/15/2011   Procedure: COMPUTER ASSISTED TOTAL KNEE ARTHROPLASTY;  Surgeon: Meredith Pel;  Location: [facility];  Service: Orthopedics;  Laterality: Right;  Right total knee arthroplasty  . KNEE ARTHROSCOPY Left 12/2009  . NASAL SINUS SURGERY  2017  . TONSILLECTOMY  age 89  . TOTAL KNEE ARTHROPLASTY Left 11-27-2010    dr dean   [facility]   Social History   Occupational History  . Occupation: court reporter  Tobacco Use  . Smoking status: Former Smoker    Packs/day: 0.50    Years: 16.00    Pack years: 8.00    Types: Cigarettes    Quit date: 01/11/1991    Years since quitting: 28.6    . Smokeless tobacco: Never Used  Vaping Use  . Vaping Use: Never used  Substance and Sexual Activity  . Alcohol use: No  . Drug use: No  . Sexual activity: Yes    Birth control/protection: Surgical

## 2019-08-18 ENCOUNTER — Telehealth: Payer: Self-pay | Admitting: Podiatry

## 2019-08-18 NOTE — Telephone Encounter (Signed)
Pt called requesting that Dr.Wagoner please call back she missed the call

## 2019-08-19 NOTE — Telephone Encounter (Signed)
I called her last night and wasn't able to reach her. Left VM to call back.

## 2019-08-23 ENCOUNTER — Other Ambulatory Visit: Payer: Self-pay

## 2019-08-24 ENCOUNTER — Other Ambulatory Visit: Payer: Self-pay

## 2019-08-24 MED ORDER — HYOSCYAMINE SULFATE 0.125 MG SL SUBL: 0.1250 mg | SUBLINGUAL_TABLET | Freq: Four times a day (QID) | SUBLINGUAL | 1 refills | Status: DC | PRN

## 2019-08-27 ENCOUNTER — Other Ambulatory Visit (HOSPITAL_COMMUNITY): Payer: Medicare HMO

## 2019-08-31 ENCOUNTER — Encounter (HOSPITAL_BASED_OUTPATIENT_CLINIC_OR_DEPARTMENT_OTHER): Admission: RE | Payer: Self-pay | Source: Ambulatory Visit

## 2019-08-31 ENCOUNTER — Ambulatory Visit (HOSPITAL_BASED_OUTPATIENT_CLINIC_OR_DEPARTMENT_OTHER): Admission: RE | Admit: 2019-08-31 | Payer: Medicare HMO | Source: Ambulatory Visit | Admitting: Orthopedic Surgery

## 2019-08-31 SURGERY — ARTHROSCOPY, ANKLE
Anesthesia: General | Site: Ankle | Laterality: Left

## 2019-10-18 ENCOUNTER — Ambulatory Visit: Payer: Medicare HMO | Admitting: Podiatry

## 2019-11-15 ENCOUNTER — Ambulatory Visit: Payer: Medicare HMO | Admitting: Podiatry

## 2019-11-15 ENCOUNTER — Ambulatory Visit (INDEPENDENT_AMBULATORY_CARE_PROVIDER_SITE_OTHER): Payer: Medicare HMO

## 2019-11-15 ENCOUNTER — Other Ambulatory Visit: Payer: Self-pay

## 2019-11-15 DIAGNOSIS — M958 Other specified acquired deformities of musculoskeletal system: Secondary | ICD-10-CM

## 2019-11-15 DIAGNOSIS — M19072 Primary osteoarthritis, left ankle and foot: Secondary | ICD-10-CM | POA: Diagnosis not present

## 2019-11-15 DIAGNOSIS — M2041 Other hammer toe(s) (acquired), right foot: Secondary | ICD-10-CM

## 2019-11-15 DIAGNOSIS — M7751 Other enthesopathy of right foot: Secondary | ICD-10-CM

## 2019-11-15 DIAGNOSIS — M25871 Other specified joint disorders, right ankle and foot: Secondary | ICD-10-CM | POA: Diagnosis not present

## 2019-11-15 NOTE — Progress Notes (Signed)
HPI: 68 y.o. female presenting today for evaluation of right foot pain.  Patient states that over the past year she has been dealing with left foot and ankle pain.  She had seen 4 prior physicians for the left foot and ankle, however most recently it was resolved when she went and saw Dr. Gershon Mussel, local podiatrist.  Today she is doing very well in regards to the left foot. Patient states that for the past 8-12 months she has been dealing with chronic right foot pain.  Now that the left foot is resolved she would like to address the right.  She has developed symptomatic painful calluses to the right foot.  She has tried different toe strapping and callus remover with minimal relief.  She was diagnosed with bone spurs by Dr. Gershon Mussel, podiatrist, and referred here for possible surgical intervention.  Past Medical History:  Diagnosis Date  . Chronic diarrhea    due to IBS  . Dyslipidemia   . GERD (gastroesophageal reflux disease)   . Hiatal hernia   . History of palpitations   . History of skin cancer    nose  . IBS (irritable bowel syndrome)   . OA (osteoarthritis)    knees and lower back  . OAB (overactive bladder)   . Prolapsed internal hemorrhoids, grade 3   . Wears contact lenses   . Wears dentures    UPPER     Physical Exam: General: The patient is alert and oriented x3 in no acute distress.  Dermatology: Skin is warm, dry and supple bilateral lower extremities. Negative for open lesions or macerations.  Hyperkeratotic symptomatic callus tissue noted directly underlying the tibial sesamoid of the right foot.  Associated tenderness to palpation.  Vascular: Palpable pedal pulses bilaterally. No edema or erythema noted. Capillary refill within normal limits.  Neurological: Epicritic and protective threshold grossly intact bilaterally.   Musculoskeletal Exam: Range of motion within normal limits to all pedal and ankle joints bilateral. Muscle strength 5/5 in all groups bilateral.   Tenderness to palpation directly to the tibial sesamoid as well as to the fourth toe right foot.  Hammertoe contracture also noted to the right foot fourth digit.  Radiographic Exam:  Normal osseous mineralization. Joint spaces preserved. No fracture/dislocation/boney destruction.  Hyperkeratotic callus tissue and sensitivity was marked with a radiographic marker and x-rays taken.  The sensitive area is directly underlying the tibial sesamoid.  Assessment: 1.  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  Hammertoe/capsulitis right fourth toe 3. H/o left foot PT tendinitis-most recently resolved with Dr. Gershon Mussel, local podiatrist 4. PSxHx: b/l knee replacements: 2021. RT foot bunionectomy x3: 1980s, 2015. RT flatfoot reconstructive surgery: 2010  Plan of Care:  1. Patient evaluated. X-Rays reviewed.  2. Today we discussed the conservative versus surgical management of the presenting pathology. The patient opts for surgical management. All possible complications and details of the procedure were explained. All patient questions were answered. No guarantees were expressed or implied. 3. Authorization for surgery was initiated today. Surgery will consist of tibial sesamoidectomy right.  Arthroplasty right fourth toe (possible middle phalangectomy). 4.  Return to clinic 1 week postop        Edrick Kins, DPM [facility]  Dr. Edrick Kins, DPM    [address]  [place], [place] [address]                Office [phone]  Fax [phone]

## 2019-12-15 ENCOUNTER — Telehealth: Payer: Self-pay

## 2019-12-15 NOTE — Telephone Encounter (Addendum)
DOS 12/30/2019  SESAMOIDECTOMY RT - 28315 HAMMERTOE REPAIR 4TH RT - 28285  The following codes do not require a pre-authorization Created on 12/15/2019 Service info 28315 Removal of small bone underlying long bone of foot at toe joint   Service summary Created on 12/17/2019 Procedure 28285 Correction of toe joint deformity  Diagnosis M25.871 Other specified joint disorders, right ankle and foot (primary) M20.41 Other hammer toe(s) (acquired), right foot  Confirmation date Dec 30, 2019 - Jan 29, 2020 [place] authorization number [id number]

## 2019-12-17 ENCOUNTER — Telehealth: Payer: Self-pay

## 2019-12-17 NOTE — Telephone Encounter (Signed)
error 

## 2019-12-30 ENCOUNTER — Other Ambulatory Visit: Payer: Self-pay | Admitting: Podiatry

## 2019-12-30 DIAGNOSIS — M25871 Other specified joint disorders, right ankle and foot: Secondary | ICD-10-CM | POA: Diagnosis not present

## 2019-12-30 DIAGNOSIS — M2041 Other hammer toe(s) (acquired), right foot: Secondary | ICD-10-CM | POA: Diagnosis not present

## 2019-12-30 MED ORDER — OXYCODONE-ACETAMINOPHEN 5-325 MG PO TABS: 1.0000 | ORAL_TABLET | ORAL | 0 refills | Status: DC | PRN

## 2019-12-30 NOTE — Progress Notes (Signed)
PRN postop 

## 2020-01-05 ENCOUNTER — Encounter: Payer: Medicare HMO | Admitting: Podiatry

## 2020-01-07 ENCOUNTER — Other Ambulatory Visit: Payer: Self-pay

## 2020-01-07 ENCOUNTER — Ambulatory Visit (INDEPENDENT_AMBULATORY_CARE_PROVIDER_SITE_OTHER): Payer: Medicare HMO

## 2020-01-07 ENCOUNTER — Encounter: Payer: Self-pay | Admitting: Podiatry

## 2020-01-07 ENCOUNTER — Ambulatory Visit (INDEPENDENT_AMBULATORY_CARE_PROVIDER_SITE_OTHER): Payer: Medicare HMO | Admitting: Podiatry

## 2020-01-07 VITALS — BP 131/76 | HR 78 | Temp 97.1°F

## 2020-01-07 DIAGNOSIS — M25871 Other specified joint disorders, right ankle and foot: Secondary | ICD-10-CM | POA: Diagnosis not present

## 2020-01-07 DIAGNOSIS — M2041 Other hammer toe(s) (acquired), right foot: Secondary | ICD-10-CM | POA: Diagnosis not present

## 2020-01-07 DIAGNOSIS — Z9889 Other specified postprocedural states: Secondary | ICD-10-CM

## 2020-01-07 NOTE — Progress Notes (Signed)
   Subjective:  Patient presents today status post tibial sesamoidectomy and hammertoe repair fourth digit right foot. DOS: 12/30/2019. Patient states she is doing very well the pain is tolerable. She has been minimally weightbearing in the surgical shoe as instructed. No new complaints at this time  Past Medical History:  Diagnosis Date  . Chronic diarrhea    due to IBS  . Dyslipidemia   . GERD (gastroesophageal reflux disease)   . Hiatal hernia   . History of palpitations   . History of skin cancer    nose  . IBS (irritable bowel syndrome)   . OA (osteoarthritis)    knees and lower back  . OAB (overactive bladder)   . Prolapsed internal hemorrhoids, grade 3   . Wears contact lenses   . Wears dentures    UPPER      Objective/Physical Exam Neurovascular status intact.  Skin incisions appear to be well coapted with sutures intact. No sign of infectious process noted. No dehiscence. No active bleeding noted. Moderate edema noted to the surgical extremity.  Radiographic Exam:  Orthopedic hardware and osteotomies sites appear to be stable with routine healing.  Assessment: 1. s/p hammertoe repair fourth digit and tibial sesamoidectomy right. DOS: 12/30/2019 2.h/o left foot PT tendinitis-most recently resolved with Dr. Gershon Mussel, local podiatrist 3. PSxHx: b/l knee replacements: 2021. RT foot bunionectomy x3: 1980s, 2015. RT flatfoot reconstructive surgery: 2010   Plan of Care:  1. Patient was evaluated. X-rays reviewed 2. Clean dressings applied. Keep clean dry and intact x1 week 3. Continue minimal weightbearing in the postsurgical shoe 4. Return to clinic in 1 week for suture removal   Edrick Kins, DPM [facility]  Dr. Edrick Kins, DPM    [address], [place] [address]                Office [phone]  Fax [phone]

## 2020-01-12 ENCOUNTER — Encounter: Payer: Medicare HMO | Admitting: Podiatry

## 2020-01-24 ENCOUNTER — Other Ambulatory Visit: Payer: Self-pay

## 2020-01-24 ENCOUNTER — Ambulatory Visit (INDEPENDENT_AMBULATORY_CARE_PROVIDER_SITE_OTHER): Payer: Medicare HMO | Admitting: Podiatry

## 2020-01-24 ENCOUNTER — Encounter: Payer: Self-pay | Admitting: Podiatry

## 2020-01-24 DIAGNOSIS — Z9889 Other specified postprocedural states: Secondary | ICD-10-CM

## 2020-01-24 DIAGNOSIS — M25871 Other specified joint disorders, right ankle and foot: Secondary | ICD-10-CM

## 2020-01-24 DIAGNOSIS — M2041 Other hammer toe(s) (acquired), right foot: Secondary | ICD-10-CM

## 2020-01-24 NOTE — Progress Notes (Signed)
   Subjective:  Patient presents today status post tibial sesamoidectomy and hammertoe repair fourth digit right foot. DOS: 12/30/2019. Patient states she is doing very well the pain is tolerable. She has been minimally weightbearing in the surgical shoe as instructed. No new complaints at this time  Past Medical History:  Diagnosis Date  . Chronic diarrhea    due to IBS  . Dyslipidemia   . GERD (gastroesophageal reflux disease)   . Hiatal hernia   . History of palpitations   . History of skin cancer    nose  . IBS (irritable bowel syndrome)   . OA (osteoarthritis)    knees and lower back  . OAB (overactive bladder)   . Prolapsed internal hemorrhoids, grade 3   . Wears contact lenses   . Wears dentures    UPPER      Objective/Physical Exam Neurovascular status intact.  Skin incisions appear to be well coapted with sutures intact. No sign of infectious process noted. No dehiscence. No active bleeding noted. Moderate edema noted to the surgical extremity.  Radiographic Exam:  Orthopedic hardware and osteotomies sites appear to be stable with routine healing.  Assessment: 1. s/p hammertoe repair fourth digit and tibial sesamoidectomy right. DOS: 12/30/2019 2.h/o left foot PT tendinitis-most recently resolved with Dr. Gershon Mussel, local podiatrist 3. PSxHx: b/l knee replacements: 2021. RT foot bunionectomy x3: 1980s, 2015. RT flatfoot reconstructive surgery: 2010   Plan of Care:  1. Patient was evaluated. X-rays reviewed 2.  Ace bandage was applied 3.  Transition to regular shoes from surgical shoe once comfortable. 4.  Sutures were removed.  No dehiscence noted.  No complication noted.  Patient will return as needed back to the clinic.  If any foot inclusion ankle issues arise in the future she will come back and see Dr. Amalia Hailey.

## 2020-01-26 ENCOUNTER — Encounter: Payer: Medicare HMO | Admitting: Podiatry

## 2020-01-29 HISTORY — PX: FOOT SURGERY: SHX648

## 2020-02-09 ENCOUNTER — Encounter: Payer: Medicare HMO | Admitting: Podiatry

## 2020-03-23 ENCOUNTER — Encounter: Payer: Self-pay | Admitting: Gastroenterology

## 2020-03-23 ENCOUNTER — Ambulatory Visit (INDEPENDENT_AMBULATORY_CARE_PROVIDER_SITE_OTHER): Payer: Medicare Other | Admitting: Gastroenterology

## 2020-03-23 ENCOUNTER — Other Ambulatory Visit: Payer: Medicare Other

## 2020-03-23 VITALS — BP 104/66 | HR 78 | Ht 63.0 in | Wt 180.2 lb

## 2020-03-23 DIAGNOSIS — K529 Noninfective gastroenteritis and colitis, unspecified: Secondary | ICD-10-CM

## 2020-03-23 DIAGNOSIS — R140 Abdominal distension (gaseous): Secondary | ICD-10-CM | POA: Diagnosis not present

## 2020-03-23 DIAGNOSIS — R143 Flatulence: Secondary | ICD-10-CM

## 2020-03-23 MED ORDER — METRONIDAZOLE 500 MG PO TABS: 500.0000 mg | ORAL_TABLET | Freq: Three times a day (TID) | ORAL | 0 refills | Status: DC

## 2020-03-23 NOTE — Progress Notes (Signed)
Joann [name]  Chief Complaint: Chronic diarrhea  Subjective  History: From my 04/06/19 office [name]:  "IBS, last seen June 2019.  Colonoscopy and biopsies did not show microscopic colitis.  However, she previously reported significant symptom improvement taking her daughter's mesalamine.  Therefore I prescribed her some in case she had milder patchy microscopic colitis not evident on biopsies.  My clinical suspicion was still primarily IBS.  She had previously tried Viberzi, once daily was somewhat helpful but twice daily cause constipation.  Prolapsing internal hemorrhoids when last seen, underwent 2 column hemorrhoidectomy by Dr. Marcello Moores in August 2019. Chronic heartburn, globus sensation when last seen in clinic.  EGD normal June 2019.  I felt that allergic rhinitis and postnasal drip were contributing to the throat symptoms.   Joann [name] is feeling fairly well these days.  She still has intermittent pyrosis that comes and goes in severity.  She has intermittent lower abdominal cramps with urgency and loose stool, worse under periods of stress.  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 Hemorrhoid surgery relieve the prolapse and bleeding."   No prior trial of SIBO treatment or bile acid binding agent. Negative celiac labs 2018 _______________________  Joann [name] is still troubled by severe diarrhea that is "uncontrollable at times". It affects her busy work as a Systems developer and makes it difficult for her to shop and other activities of daily living. She has still occasionally taken some doses of her daughters Joann [name], especially if she needs to "be somewhere". Taking averages 1 or 2 days will reportedly cause a stool to become formed. She feels that the Levsin I prescribed was somewhat helpful but not as much as the Viberzi had been. She has bloating gas and cramps,  generally bandlike and lower.  ROS: Cardiovascular:  no chest pain Respiratory: no dyspnea Reports significant work-related stress as before Remainder systems negative except as above The patient's Past Medical, Family and Social History were reviewed and are on file in the EMR.  Objective:  Med list reviewed  Current Outpatient Medications:  .  acetaminophen (TYLENOL) 500 MG tablet, Take 500 mg by mouth every 6 (six) hours as needed., Disp: , Rfl:  .  hyoscyamine (LEVSIN SL) 0.125 MG SL tablet, Take 1 tablet (0.125 mg total) by mouth every 6 (six) hours as needed. (Patient taking differently: Take 0.25 mg by mouth every 6 (six) hours as needed.), Disp: 90 tablet, Rfl: 1 .  mesalamine (APRISO) 0.375 g 24 hr capsule, Take 375 mg by mouth as needed., Disp: , Rfl:  .  metroNIDAZOLE (FLAGYL) 500 MG tablet, Take 1 tablet (500 mg total) by mouth 3 (three) times daily., Disp: 30 tablet, Rfl: 0 .  omeprazole (PRILOSEC OTC) 20 MG tablet, Take 20 mg by mouth daily as needed (for acid reflux/hearburn.)., Disp: , Rfl:  .  Soft Lens Products (REWETTING DROPS) SOLN, Apply 1 drop to eye daily as needed (for dry contact lenses)., Disp: , Rfl:    Vital signs in last 24 hrs: Vitals:   03/23/20 0925  BP: 104/66  Pulse: 78   Wt Readings from Last 3 Encounters:  03/23/20 180 lb 4 oz (81.8 kg)  08/16/19 194 lb (88 kg)  04/06/19 194 lb (88 kg)    Physical Exam  She is well-appearing  HEENT: sclera anicteric, oral mucosa moist without lesions  Neck: supple, no thyromegaly,  JVD or lymphadenopathy  Cardiac: RRR without murmurs, S1S2 heard, no peripheral edema  Pulm: clear to auscultation bilaterally, normal RR and effort noted  Abdomen: soft, mild bilateral lower tenderness, with active bowel sounds. No guarding or palpable hepatosplenomegaly.  Skin; warm and dry, no jaundice or rash  Labs:   ___________________________________________ Radiologic  studies:   ____________________________________________ Other:   _____________________________________________ Assessment & Plan  Assessment: Encounter Diagnoses  Name Primary?  . Chronic diarrhea Yes  . Abdominal bloating   . Flatulence    Overall, still feel the majority of her symptoms are from IBS. She could have concomitant EPI, SIBO or less likely infection. I still cannot account for why even just a few doses of mesalamine significantly improves this diarrhea. There could be sampling error of biopsies that missed microscopic colitis, though I cannot recall having seen him before. Even if that were the case, mesalamine is typically not very effective for microscopic colitis nor would one expected to work with just a few doses.   Plan: Stool for ova and parasites, C. difficile antigen and toxin, elastase Empiric trial of metronidazole 500 mg 3 times daily for 10 days  I asked her to contact me after finishing the antibiotic course to see if there is any improvement, and we will also contact her with results of stool testing. If she is not better, I am amenable to prescribing mesalamine for period of time and see if it keeps this problem under control. It is a relatively low risk medication, she would need to have renal function checked because of the rare risk of nephritis.  32 minutes were spent on this encounter (including chart review, history/exam, counseling/coordination of care, and documentation) > 50% of that time was spent on counseling and coordination of care.  Topics discussed included: Chronic diarrhea, differential diagnosis and treatment plan.  Joann [name]

## 2020-03-23 NOTE — Patient Instructions (Signed)
If you are age 72 or older, your body mass index should be between 23-30. Your Body mass index is 31.93 kg/m. If this is out of the aforementioned range listed, please consider follow up with your Primary Care Provider.  If you are age 27 or younger, your body mass index should be between 19-25. Your Body mass index is 31.93 kg/m. If this is out of the aformentioned range listed, please consider follow up with your Primary Care Provider.   It was a pleasure to see you today!  Dr. Loletha Carrow

## 2020-04-27 ENCOUNTER — Other Ambulatory Visit: Payer: Medicare Other

## 2020-04-27 DIAGNOSIS — R140 Abdominal distension (gaseous): Secondary | ICD-10-CM

## 2020-04-27 DIAGNOSIS — R143 Flatulence: Secondary | ICD-10-CM

## 2020-04-27 DIAGNOSIS — K529 Noninfective gastroenteritis and colitis, unspecified: Secondary | ICD-10-CM

## 2020-05-03 LAB — OVA AND PARASITE EXAMINATION
CONCENTRATE RESULT:: NONE SEEN
MICRO NUMBER:: 11716121
SPECIMEN QUALITY:: ADEQUATE
TRICHROME RESULT:: NONE SEEN

## 2020-05-03 LAB — C. DIFFICILE GDH AND TOXIN A/B
GDH ANTIGEN: DETECTED
MICRO NUMBER:: 11716246
SPECIMEN QUALITY:: ADEQUATE
TOXIN A AND B: NOT DETECTED

## 2020-05-03 LAB — CLOSTRIDIUM DIFFICILE TOXIN B, QUALITATIVE, REAL-TIME PCR: Toxigenic C. Difficile by PCR: DETECTED — AB

## 2020-05-03 LAB — PANCREATIC ELASTASE, FECAL: Pancreatic Elastase-1, Stool: 385 mcg/g

## 2020-05-04 ENCOUNTER — Other Ambulatory Visit: Payer: Self-pay

## 2020-05-04 MED ORDER — VANCOMYCIN HCL 125 MG PO CAPS: 125.0000 mg | ORAL_CAPSULE | Freq: Four times a day (QID) | ORAL | 0 refills | Status: AC

## 2020-05-05 ENCOUNTER — Telehealth: Payer: Self-pay | Admitting: Orthopedic Surgery

## 2020-05-05 NOTE — Telephone Encounter (Signed)
ok 

## 2020-05-05 NOTE — Telephone Encounter (Signed)
Pt called wanting to make Dr.Dean aware that she was diagnosed with C.Diss in her "gut" and she is being prescribed bicomyson a strong antibiotic. Pt states if Dr.Dean has any questions he can call her.  [phone]

## 2020-05-05 NOTE — Telephone Encounter (Signed)
FYI See below. Looks like patient has c-diff

## 2020-05-08 ENCOUNTER — Other Ambulatory Visit: Payer: Self-pay | Admitting: Gastroenterology

## 2020-05-10 ENCOUNTER — Telehealth: Payer: Self-pay | Admitting: Gastroenterology

## 2020-05-10 MED ORDER — FLUCONAZOLE 100 MG PO TABS: 100.0000 mg | ORAL_TABLET | Freq: Once | ORAL | 0 refills | Status: AC

## 2020-05-10 NOTE — Telephone Encounter (Signed)
Left a detailed message to inform patient of the Rx

## 2020-05-10 NOTE — Telephone Encounter (Signed)
Patient called requesting to get a medication to help her with yeast infection she developed with the Vancomycin

## 2020-05-10 NOTE — Telephone Encounter (Signed)
I sent a Rx for one tablet of fluconazole, which will usually take care of it  - HD

## 2020-05-10 NOTE — Telephone Encounter (Signed)
Please advise Dr Loletha Carrow

## 2020-05-15 ENCOUNTER — Telehealth: Payer: Self-pay

## 2020-05-15 MED ORDER — DIFICID 200 MG PO TABS: 200.0000 mg | ORAL_TABLET | Freq: Two times a day (BID) | ORAL | 0 refills | Status: AC

## 2020-05-15 NOTE — Telephone Encounter (Signed)
-----   Message from Yevette Edwards, RN sent at 05/04/2020 10:22 AM EDT ----- Regarding: Symptom update Symptom update, see 04/27/20 stool study result note. Positive C. Difficile

## 2020-05-15 NOTE — Telephone Encounter (Signed)
Dr. Carlean Purl, as DOD this morning. Joann [name] patient with a history of IBS and recently C. Diff positive on 04/27/20, treated with Vancomycin 125 mg QID for 10 days.   Spoke with patient, symptoms have improved as far as lower abdominal cramping. She is still reports loose stools about 6-8 times a day, she states that the consistency has improved. Patient states that she has issues with her anal sphincter muscles after the hemorrhoidectomy. Please advise on any further recommendations at this time. Thanks

## 2020-05-15 NOTE — Telephone Encounter (Signed)
Spoke with patient in regards to recommendations. Patient will contact us with a symptom update after she completes Dificid prescription. Patient verbalized understanding and had no concerns at the end of the call.

## 2020-05-15 NOTE — Telephone Encounter (Signed)
I reviewed her records.  It sounds like she is better but with that number of loose stools 6-8 times a day it could be that the C. difficile is not gone though it also could be her IBS.  I have prescribed Dificid to be taken over the next 10 days to see if that makes things better.  It is a different antibiotic for C. Difficile.  She should contact Dr. Loletha Carrow with an update after that is completed as well.  Sooner if needed.

## 2020-05-15 NOTE — Addendum Note (Signed)
Addended by: Gatha Mayer on: 05/15/2020 04:14 PM   Modules accepted: Orders

## 2020-05-17 ENCOUNTER — Telehealth: Payer: Self-pay

## 2020-05-17 NOTE — Telephone Encounter (Signed)
Inbound call from patient stating insurance have approved prescription for Dificid.

## 2020-05-17 NOTE — Telephone Encounter (Signed)
Received a fax from [facility] in regards to Dificid prescription. Patient's co-pay is $1500. Advised patient that I will send the prescription to [facility] so that they can try to get the best coverage for her. Patient is aware that it will take several days for them to work on the authorization but once they have the approval they will contact her directly to set up shipment of the medication to her home. Patient verbalized understanding and had no concerns at the end of the call.   Written prescription, records, demographic and insurance information has been faxed to [facility] at [phone].

## 2020-05-17 NOTE — Telephone Encounter (Signed)
Lm on vm for patient to return call 

## 2020-05-18 NOTE — Telephone Encounter (Signed)
Joann [name] received approval, but "high patient cost or high copay. Initiating funding for patient, [facility] is calling patient to discuss options".

## 2020-05-18 NOTE — Telephone Encounter (Signed)
Received a fax from [facility] stating that they have been "unable to contact the patient." They are trying to reach her to discuss the 1568.43 copay and options that they have. Left detailed message on patient's vm for her to call them at [phone] to discuss or to give me a call back if she had any further questions.

## 2020-05-19 ENCOUNTER — Telehealth: Payer: Self-pay | Admitting: Gastroenterology

## 2020-05-19 NOTE — Telephone Encounter (Signed)
See telephone encounter 05/17/20 for more information.

## 2020-05-19 NOTE — Telephone Encounter (Signed)
Kynli, Chou [phone]  Michel Harrow Y 2 hours ago (1:12 PM)   Pt calling to inform [employer] will be faxing a form to be completed by provider and herself. Pt states she plans to come by Monday to complete her part. [employer] [phone].Marland Kitchen Plz advise thanks   Incoming call    Spoke with patient to get more information. She states that [facility] provided her the number to the [employer] financial assistance department. Patient states that she does not have a fax so the representative will be faxing the form to our office. Patient states that she will fill out a page and the provider will need to complete the other page with the prescription. Advised that I will let her know once we have received the form from [facility]. Patient verbalized understanding and had no concerns at the end of the call.

## 2020-05-22 NOTE — Telephone Encounter (Signed)
Spoke with patient, advised that we did not receive a fax but I was able to find the [employer] patient assistance enrollment form online. Advised that I will leave the sheet that she needs to complete at the front desk. Advised that once her portion is complete I will fax the completed form, to [facility] at [phone]. She is aware that once the form has been received a representative will contact her directly usually within 20 minutes. Patient verbalized understanding of all information and had no concerns at the end of the call.

## 2020-05-22 NOTE — Telephone Encounter (Signed)
Patient came in today to complete page 1 of the [facility] patient assistance enrollment form. The completed form has been faxed to [facility] at [phone].

## 2020-05-22 NOTE — Telephone Encounter (Addendum)
Pt called asking if we received form from [facility] so she can come to fill out her part today. Pls call her at [phone].

## 2020-05-29 NOTE — Telephone Encounter (Signed)
Patient left me a voicemail stating that she spoke with a [employer] representative and they told her that since she has Medicare Part D prescription coverage she will need to complete an attestation form. Patient states that they have faxed the form to our office. I have received the attestation form.   Left detailed message on patient's voicemail letting her know that I am unable to e-mail her a copy of the form because it is not electronic and I can't find an online copy. Advised that I can put a copy in the mail which may take a couple of weeks to receive or I can place a copy at the front desk for her to complete and then I can fax it for her. Advised patient to call me back and let me know how she wanted to proceed.

## 2020-05-29 NOTE — Telephone Encounter (Signed)
Inbound call from the patient stating she never received the medication.  Please advise.

## 2020-05-29 NOTE — Telephone Encounter (Signed)
Spoke with patient, she has been advised that she was supposed to receive a call form a [employer] representative about 20 minutes after they received the completed form. Advised that she reach out to [facility] to see if they are ready to set up shipment or if they need anything else from [place]. Provided patient with the phone number to [facility]. Advised patient to call back if she needed anything else from [place]. Patient verbalized understanding and had no concerns at the end of the call.

## 2020-05-30 NOTE — Telephone Encounter (Signed)
A copy of the attestation form from [facility] has been placed at the front desk. Will wait for patient to complete and will fax.

## 2020-05-30 NOTE — Telephone Encounter (Signed)
Inbound call from patient. States she will come in the office tomorrow 05/31/20 to fill out form.

## 2020-05-31 NOTE — Telephone Encounter (Signed)
Patient completed the attestation form and it has been faxed to [facility] patient assistance.

## 2020-09-01 ENCOUNTER — Other Ambulatory Visit: Payer: Self-pay

## 2020-09-01 ENCOUNTER — Ambulatory Visit: Payer: Medicare Other

## 2020-09-01 ENCOUNTER — Ambulatory Visit (INDEPENDENT_AMBULATORY_CARE_PROVIDER_SITE_OTHER): Payer: Self-pay

## 2020-09-01 DIAGNOSIS — I8393 Asymptomatic varicose veins of bilateral lower extremities: Secondary | ICD-10-CM

## 2020-09-01 NOTE — Progress Notes (Signed)
Treated pt's small reticular and spider veins in R leg with Asclera 1% administered with a 27g butterfly.  MD came in to assess pt before starting treatment. Patient received a total of 2 mL. Pt tolerated well. Pt has other spider veins that she wanted to treat at a later time. Post procedure care instructions were given both verbally and handout. Will follow PRN.  Photos: Yes.    Compression stockings applied: Yes.

## 2020-09-11 ENCOUNTER — Ambulatory Visit (INDEPENDENT_AMBULATORY_CARE_PROVIDER_SITE_OTHER): Payer: Medicare Other

## 2020-09-11 ENCOUNTER — Other Ambulatory Visit: Payer: Self-pay

## 2020-09-11 ENCOUNTER — Ambulatory Visit (INDEPENDENT_AMBULATORY_CARE_PROVIDER_SITE_OTHER): Payer: Medicare Other | Admitting: Podiatry

## 2020-09-11 DIAGNOSIS — M19072 Primary osteoarthritis, left ankle and foot: Secondary | ICD-10-CM

## 2020-09-11 DIAGNOSIS — M659 Synovitis and tenosynovitis, unspecified: Secondary | ICD-10-CM

## 2020-09-11 DIAGNOSIS — M25572 Pain in left ankle and joints of left foot: Secondary | ICD-10-CM

## 2020-09-11 MED ORDER — MELOXICAM 15 MG PO TABS: 15.0000 mg | ORAL_TABLET | Freq: Every day | ORAL | 1 refills | Status: DC

## 2020-09-11 MED ORDER — BETAMETHASONE SOD PHOS & ACET 6 (3-3) MG/ML IJ SUSP
3.0000 mg | Freq: Once | INTRAMUSCULAR | Status: AC
  Administered 2020-09-11: 3 mg via INTRA_ARTICULAR

## 2020-09-11 MED ORDER — METHYLPREDNISOLONE 4 MG PO TBPK: ORAL_TABLET | ORAL | 0 refills | Status: DC

## 2020-09-11 NOTE — Progress Notes (Signed)
   Subjective:  70 y.o. female presenting today for evaluation of left ankle pain is been going on for over 1 year now.  Patient has history of right foot surgery multiple times.  Her last surgery performed by me on 12/30/2019 included tibial sesamoidectomy and middle phalangectomy of the right fourth toe.  Patient is doing well on her right foot.  She has been experiencing left ankle pain now for several months.  She currently has not done anything for treatment.  She has worn a cam boot in the past however she says it aggravated her ankle.   Past Medical History:  Diagnosis Date   Chronic diarrhea    due to IBS   Dyslipidemia    GERD (gastroesophageal reflux disease)    Hiatal hernia    History of palpitations    History of skin cancer    nose   IBS (irritable bowel syndrome)    OA (osteoarthritis)    knees and lower back   OAB (overactive bladder)    Prolapsed internal hemorrhoids, grade 3    Wears contact lenses    Wears dentures    UPPER     Objective / Physical Exam:  General:  The patient is alert and oriented x3 in no acute distress. Dermatology:  Skin is warm, dry and supple bilateral lower extremities. Negative for open lesions or macerations. Vascular:  Palpable pedal pulses bilaterally. No edema or erythema noted. Capillary refill within normal limits. Neurological:  Epicritic and protective threshold grossly intact bilaterally.  Musculoskeletal Exam:  Pain on palpation to the anterior lateral medial aspects of the patient's left ankle. Mild edema noted. Range of motion within normal limits to all pedal and ankle joints bilateral. Muscle strength 5/5 in all groups bilateral.   Radiographic Exam:  Normal osseous mineralization. Joint spaces preserved. No fracture/dislocation/boney destruction.    Assessment: 1.  Capsulitis left ankle 2.  PSxHx: b/l knee replacements: 2021. RT foot bunionectomy x3: 1980s, 2015. RT flatfoot reconstructive surgery: 2010   Plan of  Care:  1. Patient was evaluated. X-Rays reviewed.  2. Injection of 0.5 mL Celestone Soluspan injected in the patient's left ankle. 3.  Ankle brace dispensed.  Weightbearing as tolerated daily with good supportive tennis shoes 4.  Prescription for a Medrol Dosepak 5.  Prescription for meloxicam 15 mg daily after completion of the Dosepak 6.  Return to clinic in 4 weeks.  If the patient shows no improvement we may need to proceed with MRI left ankle   Edrick Kins, DPM [facility]  Dr. Edrick Kins, [address]                                        [place], [place] [address]                Office [phone]  Fax [phone]

## 2020-10-09 ENCOUNTER — Other Ambulatory Visit: Payer: Self-pay

## 2020-10-09 ENCOUNTER — Ambulatory Visit (INDEPENDENT_AMBULATORY_CARE_PROVIDER_SITE_OTHER): Payer: Medicare Other | Admitting: Podiatry

## 2020-10-09 DIAGNOSIS — M19072 Primary osteoarthritis, left ankle and foot: Secondary | ICD-10-CM | POA: Diagnosis not present

## 2020-10-09 DIAGNOSIS — M659 Synovitis and tenosynovitis, unspecified: Secondary | ICD-10-CM

## 2020-10-09 NOTE — Progress Notes (Signed)
   Subjective:  70 y.o. female presenting today for evaluation of left ankle pain is been going on for over 1 year now.  Patient has history of right foot surgery multiple times.  Her last surgery performed by me on 12/30/2019 included tibial sesamoidectomy and middle phalangectomy of the right fourth toe.  Patient is doing well on her right foot.    Patient states that she continues to have left ankle pain despite conservative treatment.  She says the injection did not help and the ankle brace reduces swelling but it does not help alleviate her pain.  Patient also has history of irritable bowel syndrome and cannot take the NSAIDs.   Past Medical History:  Diagnosis Date   Chronic diarrhea    due to IBS   Dyslipidemia    GERD (gastroesophageal reflux disease)    Hiatal hernia    History of palpitations    History of skin cancer    nose   IBS (irritable bowel syndrome)    OA (osteoarthritis)    knees and lower back   OAB (overactive bladder)    Prolapsed internal hemorrhoids, grade 3    Wears contact lenses    Wears dentures    UPPER     Objective / Physical Exam:  General:  The patient is alert and oriented x3 in no acute distress. Dermatology:  Skin is warm, dry and supple bilateral lower extremities. Negative for open lesions or macerations. Vascular:  Palpable pedal pulses bilaterally. No edema or erythema noted. Capillary refill within normal limits. Neurological:  Epicritic and protective threshold grossly intact bilaterally.  Musculoskeletal Exam:  There continues to be chronic pain on palpation to the anterior lateral medial aspects of the patient's left ankle. Mild edema noted. Range of motion within normal limits to all pedal and ankle joints bilateral. Muscle strength 5/5 in all groups bilateral.    Assessment: 1.  Capsulitis left ankle 2.  PSxHx: b/l knee replacements: 2021. RT foot bunionectomy x3: 1980s, 2015. RT flatfoot reconstructive surgery: 2010   Plan  of Care:  1. Patient was evaluated. 2.  Unfortunately the patient has not improved significantly since last visit.  She has tried multiple conservative treatment modalities and the pain is been moderate to severe for over 1 year now. 3.  Today we are going to order an MRI left ankle 4.  Return to clinic after MRI results to review the results and discuss further treatment options.  In the past surgery was recommended for her left ankle at [facility], however they discussed drilling the ankle bone and she did not like that idea and never followed through surgery   Edrick Kins, DPM [facility]  Dr. Edrick Kins, [address]                                        [place], [place] [address]                Office [phone]  Fax [phone]

## 2020-10-11 ENCOUNTER — Ambulatory Visit: Payer: Self-pay

## 2020-10-11 ENCOUNTER — Ambulatory Visit (INDEPENDENT_AMBULATORY_CARE_PROVIDER_SITE_OTHER): Payer: Medicare Other

## 2020-10-11 ENCOUNTER — Ambulatory Visit (INDEPENDENT_AMBULATORY_CARE_PROVIDER_SITE_OTHER): Payer: Medicare Other | Admitting: Orthopedic Surgery

## 2020-10-11 ENCOUNTER — Other Ambulatory Visit: Payer: Self-pay

## 2020-10-11 VITALS — Ht 63.0 in | Wt 175.0 lb

## 2020-10-11 DIAGNOSIS — Z96653 Presence of artificial knee joint, bilateral: Secondary | ICD-10-CM

## 2020-10-15 ENCOUNTER — Encounter: Payer: Self-pay | Admitting: Orthopedic Surgery

## 2020-10-15 NOTE — Progress Notes (Signed)
Office Visit Note   Patient: Joann [name]           Date of Birth: [birth year]           MRN: [id number] Visit Date: 10/11/2020 Requested by: Vidal Schwalbe, MD [address] [place],  [place] [id number] PCP: Vidal Schwalbe, MD  Subjective: Chief Complaint  Patient presents with   Right Knee - Pain   Left Knee - Pain    HPI: Patient presents for evaluation of bilateral knee pain.  She had prior knee replacements over 10 years ago.  Reports some pain swelling and soreness with some decreased strength.  Denies any fevers or chills.  Takes Ultram as needed.  Recently diagnosed with IBS.  Had C. difficile 2 to 3 months ago.              ROS: All systems reviewed are negative as they relate to the chief complaint within the history of present illness.  Patient denies  fevers or chills.   Assessment & Plan: Visit Diagnoses:  1. History of bilateral knee arthroplasty     Plan: Impression is bilateral knee pain with normal radiographs and exam.  Loosening is possible but unlikely based on absence of effusion and absence of any radiographic changes.  She is having some symptoms but not quite enough to warrant the work-up which would be bone scan and blood work.  She is going to consider her options and if she has worsening symptoms then I recommend returning for blood work and bone scan.  Follow-Up Instructions: Return if symptoms worsen or fail to improve.   Orders:  Orders Placed This Encounter  Procedures   XR Knee 1-2 Views Right   XR Knee 1-2 Views Left   No orders of the defined types were placed in this encounter.     Procedures: No procedures performed   Clinical Data: No additional findings.  Objective: Vital Signs: Ht 5' 3"  (1.6 m)   Wt 175 lb (79.4 kg)   BMI 31.00 kg/m   Physical Exam:   Constitutional: Patient appears well-developed HEENT:  Head: Normocephalic Eyes:EOM are normal Neck: Normal range of motion Cardiovascular: Normal  rate Pulmonary/chest: Effort normal Neurologic: Patient is alert Skin: Skin is warm Psychiatric: Patient has normal mood and affect   Ortho Exam: Ortho exam demonstrates no effusion or warmth in either knee.  No groin pain with internal ex rotation of either leg.  Pedal pulses palpable.  Extensor mechanism is intact.  Collaterals are stable to varus valgus stress at 0 and 30 degrees.  Specialty Comments:  No specialty comments available.  Imaging: No results found.   PMFS History: Patient Active Problem List   Diagnosis Date Noted   Right upper quadrant pain 08/21/2017   Nausea without vomiting 08/21/2017   Hyperlipidemia 01/01/2017   Pre-diabetes 01/01/2017   Shortness of breath 01/01/2017   Palpitations 12/22/2016   Chest pain 12/19/2016   Hemorrhoids 12/19/2016   Ulcerative colitis ([place]) 12/19/2016   Past Medical History:  Diagnosis Date   Chronic diarrhea    due to IBS   Dyslipidemia    GERD (gastroesophageal reflux disease)    Hiatal hernia    History of palpitations    History of skin cancer    nose   IBS (irritable bowel syndrome)    OA (osteoarthritis)    knees and lower back   OAB (overactive bladder)    Prolapsed internal hemorrhoids, grade 3    Wears contact lenses  Wears dentures    UPPER    Family History  Problem Relation Age of Onset   Alzheimer's disease Mother    Colon cancer Neg Hx    Stomach cancer Neg Hx    Rectal cancer Neg Hx    Esophageal cancer Neg Hx    Liver cancer Neg Hx     Past Surgical History:  Procedure Laterality Date   ABDOMINAL HYSTERECTOMY  1996    W/ BSO   BREAST ENHANCEMENT SURGERY  1985   BUNIONECTOMY WITH HAMMERTOE RECONSTRUCTION Right 07-08-2014    @ [facility]   1st toe bunionectomy;  2nd toe hammertoe correction   CARDIOVASCULAR STRESS TEST  12-20-2016    [facility] , care everywhere in epic   norma nuclear perfusion study w/ no ischemia/  normal LV function and wall motion , ef 71%    CATARACT EXTRACTION, BILATERAL     COLONOSCOPY  07/2011   [facility]    FOOT SURGERY     2 bone spurs removed   FOREIGN BODY REMOVAL Right 02/27/2016   Procedure: REMOVAL FOREIGN BODY EXTREMITY;  Surgeon: Meredith Pel, MD;  Location: [facility];  Service: Orthopedics;  Laterality: Right;   HEMORRHOID SURGERY N/A 09/05/2017   Procedure: TWO COLUMN HEMORRHOIDECTOMY, HEMROIDPEXY;  Surgeon: Leighton Ruff, MD;  Location: [facility];  Service: General;  Laterality: N/A;   KNEE ARTHROPLASTY  01/15/2011   Procedure: COMPUTER ASSISTED TOTAL KNEE ARTHROPLASTY;  Surgeon: Meredith Pel;  Location: [facility];  Service: Orthopedics;  Laterality: Right;  Right total knee arthroplasty   KNEE ARTHROSCOPY Left 12/2009   NASAL SINUS SURGERY  2017   TONSILLECTOMY  age 86   TOTAL KNEE ARTHROPLASTY Left 11-27-2010    dr Kairen Hallinan   [place]   Occupational History   Occupation: court reporter  Tobacco Use   Smoking status: Former    Packs/day: 0.50    Years: 16.00    Pack years: 8.00    Types: Cigarettes    Quit date: 01/11/1991    Years since quitting: 29.7   Smokeless tobacco: Never  Vaping Use   Vaping Use: Never used  Substance and Sexual Activity   Alcohol use: No   Drug use: No   Sexual activity: Yes    Birth control/protection: Surgical

## 2020-10-20 ENCOUNTER — Ambulatory Visit: Payer: Medicare Other | Admitting: Gastroenterology

## 2020-10-26 ENCOUNTER — Encounter: Payer: Self-pay | Admitting: Gastroenterology

## 2020-10-26 ENCOUNTER — Other Ambulatory Visit: Payer: Medicare Other

## 2020-10-26 ENCOUNTER — Ambulatory Visit (INDEPENDENT_AMBULATORY_CARE_PROVIDER_SITE_OTHER): Payer: Medicare Other | Admitting: Gastroenterology

## 2020-10-26 ENCOUNTER — Ambulatory Visit
Admission: RE | Admit: 2020-10-26 | Discharge: 2020-10-26 | Disposition: A | Discharge status: Home / Self Care | Payer: Medicare Other | Source: Ambulatory Visit | Attending: Podiatry | Admitting: Podiatry

## 2020-10-26 ENCOUNTER — Other Ambulatory Visit: Payer: Self-pay

## 2020-10-26 VITALS — BP 116/80 | HR 84 | Ht 61.75 in | Wt 186.4 lb

## 2020-10-26 DIAGNOSIS — K529 Noninfective gastroenteritis and colitis, unspecified: Secondary | ICD-10-CM | POA: Diagnosis not present

## 2020-10-26 DIAGNOSIS — A0472 Enterocolitis due to Clostridium difficile, not specified as recurrent: Secondary | ICD-10-CM

## 2020-10-26 DIAGNOSIS — M659 Synovitis and tenosynovitis, unspecified: Secondary | ICD-10-CM

## 2020-10-26 DIAGNOSIS — M19072 Primary osteoarthritis, left ankle and foot: Secondary | ICD-10-CM

## 2020-10-26 MED ORDER — MESALAMINE ER 0.375 G PO CP24: 1500.0000 mg | ORAL_CAPSULE | Freq: Every day | ORAL | 2 refills | Status: DC

## 2020-10-26 NOTE — Progress Notes (Signed)
[facility] Progress Note  Chief Complaint: Chronic diarrhea  Subjective  History: Joann [name] is known to me for history of chronic diarrhea with elements consistent with IBS.  She had not responded to bile acid binding agent, antispasmodics, had some improvement with once daily Viberzi, but twice daily caused constipation.  She had a colonoscopy with no microscopic colitis seen on biopsies Aug 2018, but subsequently would take some of her daughter's mesalamine and report improvement after just 1 dose. She was last seen March of this year, and I did some stool studies and was planning an empiric course of metronidazole in case she has SIBO.  Stool studies positive for C. difficile PCR, treated with vancomycin, thus metronidazole not given.  Normal fecal elastase. Shortly afterward she contacted [place] having had little improvement after the vancomycin but still multiple loose stools per day with urgency and cramping.  Dr. Carlean Purl took the call while I was out of town and prescribed fidaxomicin. Hard for her to recall , but there was improvement in cramps and less stool frequency.  Got Abx for dental procedure June and then doxycycline for tick bites. Having overnight diarrhea. Cramps all over, 4-6 BM's/day and one episode of bleeding.  Pills feel slow to pass through [facility] - had normal EGD 2019  ROS: Cardiovascular:  no chest pain Respiratory: no dyspnea Fatigue Arthralgias Remainder systems negative except as above The patient's Past Medical, Family and Social History were reviewed and are on file in the EMR. Past Medical History:  Diagnosis Date   Chronic diarrhea    due to IBS   Dyslipidemia    GERD (gastroesophageal reflux disease)    Hiatal hernia    History of palpitations    History of skin cancer    nose   IBS (irritable bowel syndrome)    OA (osteoarthritis)    knees and lower back   OAB (overactive bladder)    Prolapsed internal hemorrhoids, grade 3    Wears contact  lenses    Wears dentures    UPPER   .psm  Objective:  Med list reviewed  Current Outpatient Medications:    hyoscyamine (LEVSIN SL) 0.125 MG SL tablet, Take 2 tablets (0.25 mg total) by mouth every 6 (six) hours as needed., Disp: 60 tablet, Rfl: 3   omeprazole (PRILOSEC OTC) 20 MG tablet, Take 20 mg by mouth as needed (for acid reflux/hearburn.)., Disp: , Rfl:    mesalamine (APRISO) 0.375 g 24 hr capsule, Take 4 capsules (1.5 g total) by mouth daily., Disp: 120 capsule, Rfl: 2   Vital signs in last 24 hrs: Vitals:   10/26/20 0810  BP: 116/80  Pulse: 84   Wt Readings from Last 3 Encounters:  10/26/20 186 lb 6 oz (84.5 kg)  10/11/20 175 lb (79.4 kg)  03/23/20 180 lb 4 oz (81.8 kg)    Physical Exam  Well-appearing, well-hydrated, pleasant and conversational HEENT: sclera anicteric, oral mucosa moist without lesions Neck: supple, no thyromegaly, JVD or lymphadenopathy Cardiac: RRR without murmurs, S1S2 heard, no peripheral edema Pulm: clear to auscultation bilaterally, normal RR and effort noted Abdomen: soft, lower abdominal diffuse tenderness, with active bowel sounds. No guarding or palpable hepatosplenomegaly. Skin; warm and dry, no jaundice or rash  Labs:   ___________________________________________ Radiologic studies:   ____________________________________________ Other:   _____________________________________________ Assessment & Plan  Assessment: Encounter Diagnoses  Name Primary?   Chronic diarrhea Yes   C. 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  If so, not clear if it completely explains her longstanding symptoms, as she did not get total resolution after fidaxomicin and I think she still has underlying IBS.  Still not clear why she improves even with just a few doses of mesalamine, may have had microscopic colitis not seen on biopsies due to sampling error.  Generally low risk medication, reasonable to continue since she says daily  use of it when she had a supply of it was the only thing that significantly helped her feel better   Plan: C diff PCR, GDH antigen and AB toxin to see if infection versus colonization.  Apriso 1500 mg every morning, 30-day supply with 2 refills  31 minutes were spent on this encounter (including chart review, history/exam, counseling/coordination of care, and documentation) > 50% of that time was spent on counseling and coordination of care.   Nelida Meuse III

## 2020-10-26 NOTE — Patient Instructions (Signed)
If you are age 72 or older, your body mass index should be between 23-30. Your Body mass index is 34.37 kg/m. If this is out of the aforementioned range listed, please consider follow up with your Primary Care Provider.  If you are age 41 or younger, your body mass index should be between 19-25. Your Body mass index is 34.37 kg/m. If this is out of the aformentioned range listed, please consider follow up with your Primary Care Provider.   __________________________________________________________  The [facility] providers would like to encourage you to use [facility] to communicate with providers for non-urgent requests or questions.  Due to long hold times on the telephone, sending your provider a message by [facility] may be a faster and more efficient way to get a response.  Please allow 48 business hours for a response.  Please remember that this is for non-urgent requests.   Your provider has requested that you go to the basement level for lab work before leaving today. Press "B" on the elevator. The lab is located at the first door on the left as you exit the elevator.  Due to recent changes in healthcare laws, you may see the results of your imaging and laboratory studies on MyChart before your provider has had a chance to review them.  We understand that in some cases there may be results that are confusing or concerning to you. Not all laboratory results come back in the same time frame and the provider may be waiting for multiple results in order to interpret others.  Please give [place] 48 hours in order for your provider to thoroughly review all the results before contacting the office for clarification of your results.   It was a pleasure to see you today!  Thank you for trusting me with your gastrointestinal care!

## 2020-11-08 ENCOUNTER — Other Ambulatory Visit: Payer: Medicare Other

## 2020-11-08 DIAGNOSIS — A0472 Enterocolitis due to Clostridium difficile, not specified as recurrent: Secondary | ICD-10-CM

## 2020-11-08 DIAGNOSIS — K529 Noninfective gastroenteritis and colitis, unspecified: Secondary | ICD-10-CM

## 2020-11-09 LAB — C. DIFFICILE GDH AND TOXIN A/B
GDH ANTIGEN: DETECTED
MICRO NUMBER:: 12493763
SPECIMEN QUALITY:: ADEQUATE
TOXIN A AND B: NOT DETECTED

## 2020-11-09 LAB — CLOSTRIDIUM DIFFICILE TOXIN B, QUALITATIVE, REAL-TIME PCR: Toxigenic C. Difficile by PCR: NOT DETECTED

## 2020-11-20 ENCOUNTER — Ambulatory Visit (INDEPENDENT_AMBULATORY_CARE_PROVIDER_SITE_OTHER): Payer: Medicare Other | Admitting: Podiatry

## 2020-11-20 ENCOUNTER — Other Ambulatory Visit: Payer: Self-pay

## 2020-11-20 DIAGNOSIS — M958 Other specified acquired deformities of musculoskeletal system: Secondary | ICD-10-CM | POA: Diagnosis not present

## 2020-11-20 DIAGNOSIS — M659 Synovitis and tenosynovitis, unspecified: Secondary | ICD-10-CM

## 2020-11-20 NOTE — Progress Notes (Signed)
Subjective:  70 y.o. female presenting today for evaluation of left ankle pain is been going on for over 1 year now.  Patient has history of right foot surgery multiple times.  Her last surgery performed by me on 12/30/2019 included tibial sesamoidectomy and middle phalangectomy of the right fourth toe.  Patient is doing well on her right foot.    Patient states that she continues to have left ankle pain despite conservative treatment.  She says the injection did not help and the ankle brace reduces swelling but it does not help alleviate her pain.  Patient also has history of irritable bowel syndrome and cannot take the NSAIDs.   Past Medical History:  Diagnosis Date   Chronic diarrhea    due to IBS   Dyslipidemia    GERD (gastroesophageal reflux disease)    Hiatal hernia    History of palpitations    History of skin cancer    nose   IBS (irritable bowel syndrome)    OA (osteoarthritis)    knees and lower back   OAB (overactive bladder)    Prolapsed internal hemorrhoids, grade 3    Wears contact lenses    Wears dentures    UPPER     Objective / Physical Exam:  General:  The patient is alert and oriented x3 in no acute distress. Dermatology:  Skin is warm, dry and supple bilateral lower extremities. Negative for open lesions or macerations. Vascular:  Palpable pedal pulses bilaterally. No edema or erythema noted. Capillary refill within normal limits. Neurological:  Epicritic and protective threshold grossly intact bilaterally.  Musculoskeletal Exam:  There continues to be chronic pain on palpation to the anterior lateral medial aspects of the patient's left ankle. Mild edema noted. Range of motion within normal limits to all pedal and ankle joints bilateral. Muscle strength 5/5 in all groups bilateral.  MR ankle LT WO contrast 10/26/2020: IMPRESSION: 1. New stress fracture of the cuboid. 2. Unchanged subcentimeter osteochondral lesion in the medial talar dome. 3.  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 4. Partial loss of the normal fat within the sinus tarsi. Correlate for sinus tarsi syndrome.   Assessment: 1.  Capsulitis/synovitis left ankle 2.  PSxHx: b/l knee replacements: 2021. RT foot bunionectomy x3: 1980s, 2015. RT flatfoot reconstructive surgery: 2010 3.  Osteochondral defect medial talar dome left; unchanged   Plan of Care:  1. Patient was evaluated.  MRI reviewed 2.  Unfortunately the patient has not improved significantly since last visit.  She has tried multiple conservative treatment modalities and the pain is been moderate to severe for over 1 year now. 3. Today we discussed the conservative versus surgical management of the presenting pathology. The patient opts for surgical management. All possible complications and details of the procedure were explained. All patient questions were answered. No guarantees were expressed or implied. 4. Authorization for surgery was initiated today. Surgery will consist of ankle arthroscopy left.  The OCD lesion appears to be stable.  Staged approach surgically would be ankle arthroscopy to see if this helps alleviate her symptoms with possible osteochondral drilling and microfracture 5.  Return to clinic 1 week postop    Edrick Kins, DPM [facility]  Dr. Edrick Kins, DPM    [address], [place] [address]  Office [phone]  Fax [phone]

## 2020-12-07 ENCOUNTER — Other Ambulatory Visit: Payer: Self-pay | Admitting: Podiatry

## 2020-12-07 ENCOUNTER — Encounter: Payer: Self-pay | Admitting: Podiatry

## 2020-12-07 DIAGNOSIS — M65872 Other synovitis and tenosynovitis, left ankle and foot: Secondary | ICD-10-CM

## 2020-12-07 MED ORDER — MELOXICAM 15 MG PO TABS: 15.0000 mg | ORAL_TABLET | Freq: Every day | ORAL | 1 refills | Status: DC

## 2020-12-07 MED ORDER — OXYCODONE-ACETAMINOPHEN 5-325 MG PO TABS: 1.0000 | ORAL_TABLET | ORAL | 0 refills | Status: DC | PRN

## 2020-12-07 NOTE — Progress Notes (Signed)
PRN postop 

## 2020-12-12 ENCOUNTER — Telehealth: Payer: Self-pay | Admitting: *Deleted

## 2020-12-12 NOTE — Telephone Encounter (Signed)
Patient is calling because she recently had foot surgery, not able to take meloxicam causing problems since she has irritable bowels syndrome, not sure if doctor wants her to try something else since she has an upcoming appointment tomorrow, currently taking oxycodone but still in quite a bit of pain. Please advise.

## 2020-12-12 NOTE — Telephone Encounter (Signed)
No.  Patient can just discontinue the meloxicam and take the oxycodone as needed.  Thanks, Dr. Amalia Hailey

## 2020-12-13 ENCOUNTER — Other Ambulatory Visit: Payer: Self-pay

## 2020-12-13 ENCOUNTER — Ambulatory Visit (INDEPENDENT_AMBULATORY_CARE_PROVIDER_SITE_OTHER): Payer: Medicare Other

## 2020-12-13 ENCOUNTER — Ambulatory Visit (INDEPENDENT_AMBULATORY_CARE_PROVIDER_SITE_OTHER): Payer: Medicare Other | Admitting: Podiatry

## 2020-12-13 DIAGNOSIS — Z9889 Other specified postprocedural states: Secondary | ICD-10-CM

## 2020-12-13 NOTE — Telephone Encounter (Signed)
Returned the call to patient, no answer, left vmessage of doctor complications.

## 2020-12-20 ENCOUNTER — Encounter: Payer: Medicare Other | Admitting: Podiatry

## 2020-12-22 NOTE — Progress Notes (Signed)
   Subjective:  Patient presents today status post left ankle arthroscopy. DOS: 12/07/2020.  Patient states that she is in a significant amount of pain.  She rates the pain 6-08/2008.  She mention she cannot take the anti-inflammatory meloxicam that was prescribed due to irritable bowel syndrome.  She has been minimally weightbearing in the cam boot as instructed.  Past Medical History:  Diagnosis Date   Chronic diarrhea    due to IBS   Dyslipidemia    GERD (gastroesophageal reflux disease)    Hiatal hernia    History of palpitations    History of skin cancer    nose   IBS (irritable bowel syndrome)    OA (osteoarthritis)    knees and lower back   OAB (overactive bladder)    Prolapsed internal hemorrhoids, grade 3    Wears contact lenses    Wears dentures    UPPER      Objective/Physical Exam Neurovascular status intact.  Skin incisions appear to be well coapted with sutures intact. No sign of infectious process noted. No dehiscence. No active bleeding noted. Moderate edema noted to the surgical extremity.  Radiographic Exam:  Normal osseous mineralization.  Joint spaces preserved.  No significant change since prior x-rays  Assessment: 1. s/p left ankle arthroscopy. DOS: 12/07/2020   Plan of Care:  1. Patient was evaluated. X-rays reviewed 2.  Patient may continue weightbearing in the cam boot 3.  Recommend daily compression.  Ace wrap was provided 4.  Return to clinic in 1 week for suture removal.  At this time we may send the patient for physical therapy to increase range of motion and gait training and strengthening   Edrick Kins, DPM [facility]  Dr. Edrick Kins, DPM    [address], [place] [address]                Office [phone]  Fax [phone]

## 2020-12-25 ENCOUNTER — Ambulatory Visit (INDEPENDENT_AMBULATORY_CARE_PROVIDER_SITE_OTHER): Payer: Medicare Other | Admitting: Podiatry

## 2020-12-25 ENCOUNTER — Other Ambulatory Visit: Payer: Self-pay

## 2020-12-25 DIAGNOSIS — Z9889 Other specified postprocedural states: Secondary | ICD-10-CM

## 2020-12-25 NOTE — Progress Notes (Signed)
   Subjective:  Patient presents today status post left ankle arthroscopy. DOS: 12/07/2020.  Patient states that she is doing well.  The pain has significantly improved.  No new complaints at this time  Past Medical History:  Diagnosis Date   Chronic diarrhea    due to IBS   Dyslipidemia    GERD (gastroesophageal reflux disease)    Hiatal hernia    History of palpitations    History of skin cancer    nose   IBS (irritable bowel syndrome)    OA (osteoarthritis)    knees and lower back   OAB (overactive bladder)    Prolapsed internal hemorrhoids, grade 3    Wears contact lenses    Wears dentures    UPPER      Objective/Physical Exam Neurovascular status intact.  Skin incisions appear to be well coapted with sutures intact. No sign of infectious process noted. No dehiscence. No active bleeding noted. Improved Edema noted to the surgical extremity.  Range of motion to the ankle joint within normal limits   Assessment: 1. s/p left ankle arthroscopy. DOS: 12/07/2020   Plan of Care:  1. Patient was evaluated.  2.  Sutures removed 3.  Order placed for physical therapy.  Provided on a prescription pad for the patient to take to [place], [place] 4.  Patient may slowly transition to full activity no restrictions and good supportive sneakers and tennis shoes  5.  Return to clinic 6 weeks   Edrick Kins, DPM [facility]  Dr. Edrick Kins, DPM    [address], [place] [address]                Office [phone]  Fax [phone]

## 2021-01-01 ENCOUNTER — Encounter: Payer: Medicare Other | Admitting: Podiatry

## 2021-02-14 ENCOUNTER — Ambulatory Visit (INDEPENDENT_AMBULATORY_CARE_PROVIDER_SITE_OTHER): Payer: Medicare Other | Admitting: Gastroenterology

## 2021-02-14 ENCOUNTER — Encounter: Payer: Self-pay | Admitting: Gastroenterology

## 2021-02-14 ENCOUNTER — Telehealth: Payer: Self-pay

## 2021-02-14 VITALS — BP 102/62 | HR 83 | Ht 63.0 in | Wt 188.8 lb

## 2021-02-14 DIAGNOSIS — K219 Gastro-esophageal reflux disease without esophagitis: Secondary | ICD-10-CM | POA: Diagnosis not present

## 2021-02-14 DIAGNOSIS — R1319 Other dysphagia: Secondary | ICD-10-CM | POA: Diagnosis not present

## 2021-02-14 DIAGNOSIS — R002 Palpitations: Secondary | ICD-10-CM

## 2021-02-14 NOTE — Progress Notes (Signed)
[facility] Progress Note:  History: Joann [name] 02/14/2021  Referring provider: Vidal Schwalbe, MD  Reason for consult/chief complaint: Irritable Bowel Syndrome (Have not been taking Apriso for 1 month, taking Balance of Nature capsules, has really helped, no diarrhea), Gastroesophageal Reflux (Increased to Prilosec 40 mg over-the-counter Daily, still having breakthrough Sx - burning), and Dysphagia (Onset months, to foods, pills and liquids)   Subjective  HPI: From 10/26/20 office note: "Amarilis is known to me for history of chronic diarrhea with elements consistent with IBS.  She had not responded to bile acid binding agent, antispasmodics, had some improvement with once daily Viberzi, but twice daily caused constipation.  She had a colonoscopy with no microscopic colitis seen on biopsies Aug 2018, but subsequently would take some of her daughter's mesalamine and report improvement after just 1 dose. She was last seen March of this year, and I did some stool studies and was planning an empiric course of metronidazole in case she has SIBO.  Stool studies positive for C. difficile PCR, treated with vancomycin, thus metronidazole not given.  Normal fecal elastase. Shortly afterward she contacted [place] having had little improvement after the vancomycin but still multiple loose stools per day with urgency and cramping.  Dr. Carlean Purl took the call while I was out of town and prescribed fidaxomicin. Hard for her to recall , but there was improvement in cramps and less stool frequency.   Got Abx for dental procedure June and then doxycycline for tick bites. Having overnight diarrhea. Cramps all over, 4-6 BM's/day and one episode of bleeding.   Pills feel slow to pass through [facility] - had normal EGD 2019" ___________________________________________  Joann [name] is glad to report that she started taking a supplement called balance of nature that she saw advertised on TV, uses it daily  and says her abdominal cramps and diarrhea have resolved.  She no longer takes the mesalamine.  For the last few months she has had an increase in pyrosis occurring most days, persist despite increasing omeprazole to 40 mg daily.  She also also had slow recurrence of dysphagia with some food or medicines feel hung up in the chest.  She denies change in voice or coughing while eating.    ROS:  Review of Systems  Constitutional:  Positive for fatigue. Negative for appetite change and unexpected weight change.  HENT:  Negative for mouth sores and voice change.   Eyes:  Negative for pain and redness.  Respiratory:  Negative for cough and shortness of breath.   Cardiovascular:  Positive for palpitations. Negative for chest pain.       She tells me for the last few months she has had episodes of "tachycardia", that sound like palpitations she typically notices if she awakens at night.  She took her pulse and says it is running fast and when this occurs, she will also feel a discomfort in the neck and sometimes down the left arm.  It rarely occurs during the day, she does not get chest pain with exertion but seems to get winded more easily.  Genitourinary:  Negative for dysuria and hematuria.  Musculoskeletal:  Negative for arthralgias and myalgias.       Ankle pain, had arthroscopy  Skin:  Negative for pallor and rash.  Neurological:  Negative for weakness and headaches.  Hematological:  Negative for adenopathy.    Past Medical History: Past Medical History:  Diagnosis Date   Chronic diarrhea    due to IBS  Dyslipidemia    GERD (gastroesophageal reflux disease)    Hiatal hernia    History of palpitations    History of skin cancer    nose   IBS (irritable bowel syndrome)    OA (osteoarthritis)    knees and lower back   OAB (overactive bladder)    Prolapsed internal hemorrhoids, grade 3    Wears contact lenses    Wears dentures    UPPER     Past Surgical History: Past Surgical  History:  Procedure Laterality Date   ABDOMINAL HYSTERECTOMY  1996    W/ BSO   BREAST ENHANCEMENT SURGERY  1985   BUNIONECTOMY WITH HAMMERTOE RECONSTRUCTION Right 07-08-2014    @ [facility]   1st toe bunionectomy;  2nd toe hammertoe correction   CARDIOVASCULAR STRESS TEST  12-20-2016    [facility] , care everywhere in epic   norma nuclear perfusion study w/ no ischemia/  normal LV function and wall motion , ef 71%   CATARACT EXTRACTION, BILATERAL     COLONOSCOPY  07/2011   [facility]    FOOT SURGERY     2 bone spurs removed   FOOT SURGERY Left 2022   arthroscopy for arthritis   FOREIGN BODY REMOVAL Right 02/27/2016   Procedure: REMOVAL FOREIGN BODY EXTREMITY;  Surgeon: Meredith Pel, MD;  Location: [facility];  Service: Orthopedics;  Laterality: Right;   HEMORRHOID SURGERY N/A 09/05/2017   Procedure: TWO COLUMN HEMORRHOIDECTOMY, HEMROIDPEXY;  Surgeon: Leighton Ruff, MD;  Location: [facility];  Service: General;  Laterality: N/A;   KNEE ARTHROPLASTY  01/15/2011   Procedure: COMPUTER ASSISTED TOTAL KNEE ARTHROPLASTY;  Surgeon: Meredith Pel;  Location: [place];  Service: Orthopedics;  Laterality: Right;  Right total knee arthroplasty   KNEE ARTHROSCOPY Left 12/2009   NASAL SINUS SURGERY  2017   TONSILLECTOMY  age 1   TOTAL KNEE ARTHROPLASTY Left 11-27-2010    dr dean   [facility]     Family History: Family History  Problem Relation Age of Onset   Alzheimer's disease Mother    Colon cancer Neg Hx    Stomach cancer Neg Hx    Rectal cancer Neg Hx    Esophageal cancer Neg Hx    Liver cancer Neg Hx     Social History: Social History   Socioeconomic History   Marital status: Married    Spouse name: Not on file   Number of children: 3   Years of education: Not on file   Highest education level: Not on file  Occupational History   Occupation: court reporter  Tobacco Use   Smoking status: Former    Packs/day: 0.50    Years: 16.00    Pack  years: 8.00    Types: Cigarettes    Quit date: 01/11/1991    Years since quitting: 30.1   Smokeless tobacco: Never  Vaping Use   Vaping Use: Never used  Substance and Sexual Activity   Alcohol use: No   Drug use: No   Sexual activity: Yes    Birth control/protection: Surgical  Other Topics Concern   Not on file  Social History Narrative   Not on file   Social Determinants of Health   Financial Resource Strain: Not on file  Food Insecurity: Not on file  Transportation Needs: Not on file  Physical Activity: Not on file  Stress: Not on file  Social Connections: Not on file    Allergies: No Known Allergies  Outpatient  Meds: Current Outpatient Medications  Medication Sig Dispense Refill   AMBULATORY NON FORMULARY MEDICATION Take by mouth daily. Balance of nature: 3 veggie and 3 fruit capsules daily     hyoscyamine (LEVSIN SL) 0.125 MG SL tablet Take 2 tablets (0.25 mg total) by mouth every 6 (six) hours as needed. 60 tablet 3   omeprazole (PRILOSEC OTC) 20 MG tablet Take 20 mg by mouth daily.     mesalamine (APRISO) 0.375 g 24 hr capsule Take 4 capsules (1.5 g total) by mouth daily. (Patient not taking: Reported on 02/14/2021) 120 capsule 2   No current facility-administered medications for this visit.      ___________________________________________________________________ Objective   Exam:  BP 102/62    Pulse 83    Ht 5' 3"  (1.6 m)    Wt 188 lb 12.8 oz (85.6 kg)    SpO2 97%    BMI 33.44 kg/m  Wt Readings from Last 3 Encounters:  02/14/21 188 lb 12.8 oz (85.6 kg)  10/26/20 186 lb 6 oz (84.5 kg)  10/11/20 175 lb (79.4 kg)    General: Well-appearing Eyes: sclera anicteric, no redness ENT: oral mucosa moist without lesions, no cervical or supraclavicular lymphadenopathy CV: RRR without murmur, S1/S2, no JVD, no peripheral edema Resp: clear to auscultation bilaterally, normal RR and effort noted GI: soft, no tenderness, with active bowel sounds. No guarding or  palpable organomegaly noted. Skin; warm and dry, no rash or jaundice noted Neuro: awake, alert and oriented x 3. Normal gross motor function and fluent speech  Labs: No recent data  Assessment: Encounter Diagnoses  Name Primary?   Gastroesophageal reflux disease without esophagitis Yes   Esophageal dysphagia    Palpitations     A few months of worsening GERD symptoms and recurrence of dysphagia.  Symptoms persist despite increasing acid suppression.  She is doing her best to engage in antireflux diet and lifestyle measures.  An upper endoscopy is warranted, though she also has cardiac symptoms requiring further evaluation.  She needs to be ruled out for a tachy-arrhythmia such as paroxysmal A. fib.  Plan:  Cardiology evaluation warranted and referral was sent to [facility] for first available appointment.  After I hear about the results of that work-up, we can determine timing and proper instructions for upper endoscopy with possible dilation.  Thank you for the courtesy of this consult.  Please call me with any questions or concerns.  Nelida Meuse III  CC: Referring provider noted above

## 2021-02-14 NOTE — Patient Instructions (Signed)
If you are age 73 or older, your body mass index should be between 23-30. Your Body mass index is 33.44 kg/m. If this is out of the aforementioned range listed, please consider follow up with your Primary Care Provider.  If you are age 43 or younger, your body mass index should be between 19-25. Your Body mass index is 33.44 kg/m. If this is out of the aformentioned range listed, please consider follow up with your Primary Care Provider.   ________________________________________________________  The [facility] providers would like to encourage you to use [facility] to communicate with providers for non-urgent requests or questions.  Due to long hold times on the telephone, sending your provider a message by [facility] may be a faster and more efficient way to get a response.  Please allow 48 business hours for a response.  Please remember that this is for non-urgent requests.  _______________________________________________________  We have sent a referral to Cardiology.   [facility]  Address: [address] [place], [place] [address] Phone: [phone]  It was a pleasure to see you today!  Thank you for trusting me with your gastrointestinal care!

## 2021-02-14 NOTE — Telephone Encounter (Signed)
Referral sent to Cardiology. Will await appointment details.

## 2021-02-15 NOTE — Progress Notes (Signed)
CARDIOLOGY CONSULT NOTE       Patient ID: Joann [name] MRN: [id number] DOB/AGE: [birth year] 71 y.o.  Admit date: (Not on file) Referring Physician: [place] Primary Physician: Vidal Schwalbe, MD Primary Cardiologist: New Reason for Consultation: Palpitations  Active Problems:   * No active hospital problems. *   HPI:  71 y.o. with IBS , GERD followed by Dr Pincus Sanes During his office visit 02/14/21 complained of episodes of tachycardia. Notices them more on awakening at night Feels a discomfort in her neck and down her left arm with it. Symptoms not usually during the day No pain with exertion She seems to have more exertional dyspnea He GI issues Rx with prilosec, Levsin and Apriso   The latter is known to cause palpitations especially in older females rate 44% in phase 4 trials Also can be associated with muscle aches/pains in 28.5% of patients   She saw Dr Stanford Breed in 2017 for exertional dyspnea echo and ETT normal Noted LDL in 139 range but no other risk factors statin not recommended   He dyspnea sounds more like deconditioning She has had both knees replaced and left ankle likely needs work   Has three older children on does dosimetry at [employer] one teaches in [place] and one lives in [place] 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   ROS All other systems reviewed and negative except as noted above  Past Medical History:  Diagnosis Date   Chronic diarrhea    due to IBS   Dyslipidemia    GERD (gastroesophageal reflux disease)    Hiatal hernia    History of palpitations    History of skin cancer    nose   IBS (irritable bowel syndrome)    OA (osteoarthritis)    knees and lower back   OAB (overactive bladder)    Prolapsed internal hemorrhoids, grade 3    Wears contact lenses    Wears dentures    UPPER    Family History  Problem Relation Age of Onset   Alzheimer's disease Mother    Colon cancer Neg Hx    Stomach  cancer Neg Hx    Rectal cancer Neg Hx    Esophageal cancer Neg Hx    Liver cancer Neg Hx     Social History   Socioeconomic History   Marital status: Married    Spouse name: Not on file   Number of children: 3   Years of education: Not on file   Highest education level: Not on file  Occupational History   Occupation: court reporter  Tobacco Use   Smoking status: Former    Packs/day: 0.50    Years: 16.00    Pack years: 8.00    Types: Cigarettes    Quit date: 01/11/1991    Years since quitting: 30.1   Smokeless tobacco: Never  Vaping Use   Vaping Use: Never used  Substance and Sexual Activity   Alcohol use: No   Drug use: No   Sexual activity: Yes    Birth control/protection: Surgical  Other Topics Concern   Not on file  Social History Narrative   Not on file   Social Determinants of Health   Financial Resource Strain: Not on file  Food Insecurity: Not on file  Transportation Needs: Not on file  Physical Activity: Not on file  Stress: Not on file  Social Connections: Not on file  Intimate Partner Violence: Not on file    Past  Surgical History:  Procedure Laterality Date   ABDOMINAL HYSTERECTOMY  1996    W/ BSO   BREAST ENHANCEMENT SURGERY  1985   BUNIONECTOMY WITH HAMMERTOE RECONSTRUCTION Right 07-08-2014    @ [facility]   1st toe bunionectomy;  2nd toe hammertoe correction   CARDIOVASCULAR STRESS TEST  12-20-2016    [facility] , care everywhere in epic   norma nuclear perfusion study w/ no ischemia/  normal LV function and wall motion , ef 71%   CATARACT EXTRACTION, BILATERAL     COLONOSCOPY  07/2011   [facility]    FOOT SURGERY     2 bone spurs removed   FOOT SURGERY Left 2022   arthroscopy for arthritis   FOREIGN BODY REMOVAL Right 02/27/2016   Procedure: REMOVAL FOREIGN BODY EXTREMITY;  Surgeon: Meredith Pel, MD;  Location: [facility];  Service: Orthopedics;  Laterality: Right;   HEMORRHOID SURGERY N/A 09/05/2017   Procedure: TWO  COLUMN HEMORRHOIDECTOMY, HEMROIDPEXY;  Surgeon: Leighton Ruff, MD;  Location: [facility];  Service: General;  Laterality: N/A;   KNEE ARTHROPLASTY  01/15/2011   Procedure: COMPUTER ASSISTED TOTAL KNEE ARTHROPLASTY;  Surgeon: Meredith Pel;  Location: [facility];  Service: Orthopedics;  Laterality: Right;  Right total knee arthroplasty   KNEE ARTHROSCOPY Left 12/2009   NASAL SINUS SURGERY  2017   TONSILLECTOMY  age 71   TOTAL KNEE ARTHROPLASTY Left 11-27-2010    dr dean   [facility]      Current Outpatient Medications:    AMBULATORY NON FORMULARY MEDICATION, Take by mouth daily. Balance of nature: 3 veggie and 3 fruit capsules daily, Disp: , Rfl:    hyoscyamine (LEVSIN SL) 0.125 MG SL tablet, Take 2 tablets (0.25 mg total) by mouth every 6 (six) hours as needed., Disp: 60 tablet, Rfl: 3   mesalamine (APRISO) 0.375 g 24 hr capsule, Take 4 capsules (1.5 g total) by mouth daily. (Patient not taking: Reported on 02/14/2021), Disp: 120 capsule, Rfl: 2   omeprazole (PRILOSEC OTC) 20 MG tablet, Take 20 mg by mouth daily., Disp: , Rfl:     Physical Exam: There were no vitals taken for this visit.    Affect appropriate Healthy:  appears stated age 1: normal Neck supple with no adenopathy JVP normal no bruits no thyromegaly Lungs clear with no wheezing and good diaphragmatic motion Heart:  S1/S2 no murmur, no rub, gallop or click PMI normal Abdomen: benighn, BS positve, no tenderness, no AAA no bruit.  No HSM or HJR Distal pulses intact with no bruits Post bilateral TKR Post left ankle arthroplasty Trivial edema with varicosities bilaterally    Labs:   Lab Results  Component Value Date   WBC 7.6 02/23/2016   HGB 13.7 02/23/2016   HCT 41.7 02/23/2016   MCV 89.9 02/23/2016   PLT 254 02/23/2016      Radiology: No results found.  EKG: NSR rate 85 normal    ASSESSMENT AND PLAN:   Palpitations:  benign sounding may be related to Rx for IBS including Apriso would  discuss with Dr Pincus Sanes and stop this drug although she only takes it PRN  14 day monitor  IBS/GERD:  continue Levsin and prilosec Low carb diet Dyspnea/Atypical Chest Pain:  normal exam and ECG echo to assess EF   Echo  14 day monitor   F/U cardiology PRN   Signed: Jenkins Rouge 02/20/2021, 8:50 AM

## 2021-02-20 ENCOUNTER — Encounter: Payer: Self-pay | Admitting: Cardiovascular Disease

## 2021-02-20 ENCOUNTER — Ambulatory Visit (INDEPENDENT_AMBULATORY_CARE_PROVIDER_SITE_OTHER): Payer: Medicare Other | Admitting: Cardiovascular Disease

## 2021-02-20 ENCOUNTER — Ambulatory Visit (INDEPENDENT_AMBULATORY_CARE_PROVIDER_SITE_OTHER): Payer: Medicare Other

## 2021-02-20 ENCOUNTER — Other Ambulatory Visit: Payer: Self-pay

## 2021-02-20 VITALS — BP 116/80 | HR 85 | Ht 62.0 in | Wt 189.0 lb

## 2021-02-20 DIAGNOSIS — R0600 Dyspnea, unspecified: Secondary | ICD-10-CM

## 2021-02-20 DIAGNOSIS — R002 Palpitations: Secondary | ICD-10-CM

## 2021-02-20 DIAGNOSIS — R0789 Other chest pain: Secondary | ICD-10-CM

## 2021-02-20 DIAGNOSIS — R072 Precordial pain: Secondary | ICD-10-CM

## 2021-02-20 NOTE — Progress Notes (Unsigned)
[id number] 14 day zio xt from office inventory applied to patient.

## 2021-02-20 NOTE — Patient Instructions (Addendum)
Medication Instructions:  Your physician recommends that you continue on your current medications as directed. Please refer to the Current Medication list given to you today.  *If you need a refill on your cardiac medications before your next appointment, please call your pharmacy*  Lab Work: If you have labs (blood work) drawn today and your tests are completely normal, you will receive your results only by: [place] (if you have MyChart) OR A paper copy in the mail If you have any lab test that is abnormal or we need to change your treatment, we will call you to review the results.  Testing/Procedures: Your physician has requested that you have an echocardiogram. Echocardiography is a painless test that uses sound waves to create images of your heart. It provides your doctor with information about the size and shape of your heart and how well your hearts chambers and valves are working. This procedure takes approximately one hour. There are no restrictions for this procedure.  Your physician has recommended that you wear an event monitor for 2 weeks. Event monitors are medical devices that record the hearts electrical activity. Doctors most often [place] these monitors to diagnose arrhythmias. Arrhythmias are problems with the speed or rhythm of the heartbeat. The monitor is a small, portable device. You can wear one while you do your normal daily activities. This is usually used to diagnose what is causing palpitations/syncope (passing out).  Follow-Up: At [facility], you and your health needs are our priority.  As part of our continuing mission to provide you with exceptional heart care, we have created designated Provider Care Teams.  These Care Teams include your primary Cardiologist (physician) and Advanced Practice Providers (APPs -  Physician Assistants and Nurse Practitioners) who all work together to provide you with the care you need, when you need it.  We recommend signing up  for the patient portal called "MyChart".  Sign up information is provided on this After Visit Summary.  MyChart is used to connect with patients for Virtual Visits (Telemedicine).  Patients are able to view lab/test results, encounter notes, upcoming appointments, etc.  Non-urgent messages can be sent to your provider as well.   To learn more about what you can do with MyChart, go to [email or web address].    Your next appointment:   As needed  The format for your next appointment:   In Person  Provider:   Jenkins Rouge, MD {    Other Instructions  ZIO XT- Long Term Monitor Instructions  Your physician has requested you wear a ZIO patch monitor for 14 days.  This is a single patch monitor. [employer] supplies one patch monitor per enrollment. Additional stickers are not available. Please do not apply patch if you will be having a Nuclear Stress Test,  Echocardiogram, Cardiac CT, MRI, or Chest Xray during the period you would be wearing the  monitor. The patch cannot be worn during these tests. You cannot remove and re-apply the  ZIO XT patch monitor.  Your ZIO patch monitor will be mailed 3 day USPS to your address on file. It may take 3-5 days  to receive your monitor after you have been enrolled.  Once you have received your monitor, please review the enclosed instructions. Your monitor  has already been registered assigning a specific monitor serial # to you.  Billing and Patient Assistance Program Information  We have supplied [employer] with any of your insurance information on file for billing purposes. [employer] offers a sliding scale Patient  Assistance Program for patients that do not have  insurance, or whose insurance does not completely cover the cost of the ZIO monitor.  You must apply for the Patient Assistance Program to qualify for this discounted rate.  To apply, please call [employer] at [phone], select option 4, select option 2, ask to apply for  Patient Assistance  Program. Theodore Demark will ask your household income, and how many people  are in your household. They will quote your out-of-pocket cost based on that information.  [employer] will also be able to set up a 36-month interest-free payment plan if needed.  Applying the monitor   Shave hair from upper left chest.  Hold abrader disc by orange tab. Rub abrader in 40 strokes over the upper left chest as  indicated in your monitor instructions.  Clean area with 4 enclosed alcohol pads. Let dry.  Apply patch as indicated in monitor instructions. Patch will be placed under collarbone on left  side of chest with arrow pointing upward.  Rub patch adhesive wings for 2 minutes. Remove white label marked "1". Remove the white  label marked "2". Rub patch adhesive wings for 2 additional minutes.  While looking in a mirror, press and release button in center of patch. A small green light will  flash 3-4 times. This will be your only indicator that the monitor has been turned on.  Do not shower for the first 24 hours. You may shower after the first 24 hours.  Press the button if you feel a symptom. You will hear a small click. Record Date, Time and  Symptom in the Patient Logbook.  When you are ready to remove the patch, follow instructions on the last 2 pages of Patient  Logbook. Stick patch monitor onto the last page of Patient Logbook.  Place Patient Logbook in the blue and white box. Use locking tab on box and tape box closed  securely. The blue and white box has prepaid postage on it. Please place it in the mailbox as  soon as possible. Your physician should have your test results approximately 7 days after the  monitor has been [facility]  Call [facility] [phone] you have questions regarding  your ZIO XT patch monitor. Call them immediately if you see an orange light blinking on your  monitor.  If your monitor falls off in less than 4 days, contact our  Monitor department at [phone]  If your monitor becomes loose or falls off after 4 days call [employer] at [phone]  suggestions on securing your monitor

## 2021-02-21 NOTE — Telephone Encounter (Signed)
Patient has been seen by Cardiology on 02-20-2021.

## 2021-03-05 DIAGNOSIS — R0600 Dyspnea, unspecified: Secondary | ICD-10-CM

## 2021-03-05 DIAGNOSIS — R002 Palpitations: Secondary | ICD-10-CM

## 2021-03-05 DIAGNOSIS — R0789 Other chest pain: Secondary | ICD-10-CM

## 2021-03-09 ENCOUNTER — Ambulatory Visit (HOSPITAL_COMMUNITY): Payer: Medicare Other | Attending: Cardiology

## 2021-03-09 ENCOUNTER — Other Ambulatory Visit: Payer: Self-pay

## 2021-03-09 DIAGNOSIS — R002 Palpitations: Secondary | ICD-10-CM | POA: Diagnosis not present

## 2021-03-09 DIAGNOSIS — R0600 Dyspnea, unspecified: Secondary | ICD-10-CM | POA: Insufficient documentation

## 2021-03-09 DIAGNOSIS — R0789 Other chest pain: Secondary | ICD-10-CM | POA: Insufficient documentation

## 2021-03-09 DIAGNOSIS — R0609 Other forms of dyspnea: Secondary | ICD-10-CM

## 2021-03-09 LAB — ECHOCARDIOGRAM COMPLETE
Area-P 1/2: 4.31 cm2
S' Lateral: 3.1 cm

## 2021-03-12 ENCOUNTER — Telehealth: Payer: Self-pay

## 2021-03-12 DIAGNOSIS — R002 Palpitations: Secondary | ICD-10-CM

## 2021-03-12 DIAGNOSIS — I471 Supraventricular tachycardia: Secondary | ICD-10-CM

## 2021-03-12 MED ORDER — PROPRANOLOL HCL 10 MG PO TABS: 10.0000 mg | ORAL_TABLET | ORAL | 11 refills | Status: DC | PRN

## 2021-03-12 NOTE — Telephone Encounter (Signed)
Called patient with results. Will place order for Inderal and put in referral for EP.

## 2021-03-12 NOTE — Telephone Encounter (Signed)
-----   Message from Josue Hector, MD sent at 03/12/2021  7:50 AM EST ----- NSR very short bursts of SVT can take PRN inderal 10 mg refer to EP for any discussion about ablations

## 2021-03-14 ENCOUNTER — Ambulatory Visit: Payer: Medicare Other | Admitting: Orthopedic Surgery

## 2021-03-16 ENCOUNTER — Ambulatory Visit: Payer: Medicare Other | Admitting: Orthopedic Surgery

## 2021-03-28 ENCOUNTER — Ambulatory Visit: Payer: Medicare Other | Admitting: Orthopedic Surgery

## 2021-04-02 ENCOUNTER — Encounter: Payer: Self-pay | Admitting: Gastroenterology

## 2021-04-02 ENCOUNTER — Ambulatory Visit (INDEPENDENT_AMBULATORY_CARE_PROVIDER_SITE_OTHER): Payer: Medicare Other | Admitting: Gastroenterology

## 2021-04-02 VITALS — BP 120/70 | HR 81 | Ht 63.0 in | Wt 189.0 lb

## 2021-04-02 DIAGNOSIS — K580 Irritable bowel syndrome with diarrhea: Secondary | ICD-10-CM

## 2021-04-02 DIAGNOSIS — R1319 Other dysphagia: Secondary | ICD-10-CM | POA: Diagnosis not present

## 2021-04-02 DIAGNOSIS — K219 Gastro-esophageal reflux disease without esophagitis: Secondary | ICD-10-CM | POA: Diagnosis not present

## 2021-04-02 NOTE — Patient Instructions (Signed)
If you are age 73 or older, your body mass index should be between 23-30. Your Body mass index is 33.48 kg/m?Marland Kitchen If this is out of the aforementioned range listed, please consider follow up with your Primary Care Provider. ? ?If you are age 14 or younger, your body mass index should be between 19-25. Your Body mass index is 33.48 kg/m?Marland Kitchen If this is out of the aformentioned range listed, please consider follow up with your Primary Care Provider.  ? ?________________________________________________________ ? ?The [facility] providers would like to encourage you to use [facility] to communicate with providers for non-urgent requests or questions.  Due to long hold times on the telephone, sending your provider a message by [facility] may be a faster and more efficient way to get a response.  Please allow 48 business hours for a response.  Please remember that this is for non-urgent requests.  ?_______________________________________________________ ? ?You have been scheduled for an endoscopy. Please follow written instructions given to you at your visit today. ?If you use inhalers (even only as needed), please bring them with you on the day of your procedure. ? ?Due to recent changes in healthcare laws, you may see the results of your imaging and laboratory studies on MyChart before your provider has had a chance to review them.  We understand that in some cases there may be results that are confusing or concerning to you. Not all laboratory results come back in the same time frame and the provider may be waiting for multiple results in order to interpret others.  Please give [place] 48 hours in order for your provider to thoroughly review all the results before contacting the office for clarification of your results.   ? ?It was a pleasure to see you today! ? ?Thank you for trusting me with your gastrointestinal care!   ? ? ?

## 2021-04-02 NOTE — Progress Notes (Signed)
[facility] Consult Note:  History: Joann [name] 04/02/2021  Referring provider: [facility]  Reason for consult/chief complaint: Discuss ECL testing (Discuss testing. )   Subjective  HPI: Joann [name] was last seen in the office January 18 for GERD and dysphagia, at which time she was describing episodes of chest pain and palpitations.  She was seen by cardiology (Dr. Johnsie Cancel) January 24 who felt this chest pain was atypical for cardiac cause.  EKG sinus rhythm at 85, echocardiogram ordered (see below).  She also reportedly had a heart monitor that she was told was normal.  Joann [name] also has longstanding irritable bowel with diarrhea.  Last saw her, she was taking a dietary supplement she bought online that completely resolved her symptoms.  When she recently ran out, she has recurrent generalized abdominal bloating, mid to lower abdominal cramping pain and several loose oily stools per day with mucus.  These been her symptoms in the past, hyoscyamine helps somewhat but she needs several tablets and it causes dry mouth.  No prior SIBO testing or treatment.  Viberzi once daily helped a little, then twice daily caused severe constipation.  She was inquiring about another colonoscopy for her lower digestive symptoms. She continues to have heartburn symptoms with intermittent solid food dysphagia as when I had seen her in January.  Last colonoscopy August 2018, complete exam with good prep.  2 subcentimeter tubular adenomas removed in the right colon, 5-year recall recommended (but will be changed to 7 years based on updated guidelines). No polyps reported by Dr. West Carbo on July 2013 colonoscopy. ROS:  Review of Systems  Constitutional:  Negative for appetite change and unexpected weight change.  HENT:  Negative for mouth sores and voice change.   Eyes:  Negative for pain and redness.  Respiratory:  Negative for cough and shortness of breath.   Cardiovascular:   Negative for chest pain and palpitations.       Palpitations have resolved.  Genitourinary:  Negative for dysuria and hematuria.  Musculoskeletal:  Negative for arthralgias and myalgias.  Skin:  Negative for pallor and rash.  Neurological:  Negative for weakness and headaches.  Hematological:  Negative for adenopathy.    Past Medical History: Past Medical History:  Diagnosis Date   Chronic diarrhea    due to IBS   Dyslipidemia    GERD (gastroesophageal reflux disease)    Hiatal hernia    History of palpitations    History of skin cancer    nose   IBS (irritable bowel syndrome)    OA (osteoarthritis)    knees and lower back   OAB (overactive bladder)    Prolapsed internal hemorrhoids, grade 3    Wears contact lenses    Wears dentures    UPPER     Past Surgical History: Past Surgical History:  Procedure Laterality Date   ABDOMINAL HYSTERECTOMY  1996    W/ BSO   BREAST ENHANCEMENT SURGERY  1985   BUNIONECTOMY WITH HAMMERTOE RECONSTRUCTION Right 07-08-2014    @ [facility]   1st toe bunionectomy;  2nd toe hammertoe correction   CARDIOVASCULAR STRESS TEST  12-20-2016    [facility] , care everywhere in epic   norma nuclear perfusion study w/ no ischemia/  normal LV function and wall motion , ef 71%   CATARACT EXTRACTION, BILATERAL     COLONOSCOPY  07/2011   [facility]    FOOT SURGERY     2 bone spurs removed  FOOT SURGERY Left 2022   arthroscopy for arthritis   FOREIGN BODY REMOVAL Right 02/27/2016   Procedure: REMOVAL FOREIGN BODY EXTREMITY;  Surgeon: Meredith Pel, MD;  Location: [facility];  Service: Orthopedics;  Laterality: Right;   HEMORRHOID SURGERY N/A 09/05/2017   Procedure: TWO COLUMN HEMORRHOIDECTOMY, HEMROIDPEXY;  Surgeon: Leighton Ruff, MD;  Location: [facility];  Service: General;  Laterality: N/A;   KNEE ARTHROPLASTY  01/15/2011   Procedure: COMPUTER ASSISTED TOTAL KNEE ARTHROPLASTY;  Surgeon: Meredith Pel;   Location: [facility];  Service: Orthopedics;  Laterality: Right;  Right total knee arthroplasty   KNEE ARTHROSCOPY Left 12/2009   NASAL SINUS SURGERY  2017   TONSILLECTOMY  age 71   TOTAL KNEE ARTHROPLASTY Left 11-27-2010    dr dean   [facility]     Family History: Family History  Problem Relation Age of Onset   Alzheimer's disease Mother    Colon cancer Neg Hx    Stomach cancer Neg Hx    Rectal cancer Neg Hx    Esophageal cancer Neg Hx    Liver cancer Neg Hx     Social History: Social History   Socioeconomic History   Marital status: Married    Spouse name: Not on file   Number of children: 3   Years of education: Not on file   Highest education level: Not on file  Occupational History   Occupation: court reporter-semi retired  Tobacco Use   Smoking status: Former    Packs/day: 0.50    Years: 16.00    Pack years: 8.00    Types: Cigarettes    Quit date: 01/11/1991    Years since quitting: 30.2   Smokeless tobacco: Never  Vaping Use   Vaping Use: Never used  Substance and Sexual Activity   Alcohol use: No   Drug use: No   Sexual activity: Yes    Birth control/protection: Surgical  Other Topics Concern   Not on file  Social History Narrative   3 step children   Social Determinants of Health   Financial Resource Strain: Not on file  Food Insecurity: Not on file  Transportation Needs: Not on file  Physical Activity: Not on file  Stress: Not on file  Social Connections: Not on file    Allergies: No Known Allergies  Outpatient Meds: Current Outpatient Medications  Medication Sig Dispense Refill   AMBULATORY NON FORMULARY MEDICATION Take by mouth daily. Balance of nature: 3 veggie and 3 fruit capsules daily     hyoscyamine (LEVSIN SL) 0.125 MG SL tablet Take 2 tablets (0.25 mg total) by mouth every 6 (six) hours as needed. 60 tablet 3   mesalamine (APRISO) 0.375 g 24 hr capsule Take 4 capsules (1.5 g total) by mouth daily. 120 capsule 2   omeprazole (PRILOSEC OTC)  20 MG tablet Take 20 mg by mouth daily.     No current facility-administered medications for this visit.      ___________________________________________________________________ Objective   Exam:  BP 120/70    Pulse 81    Ht 5' 3"  (1.6 m)    Wt 189 lb (85.7 kg)    BMI 33.48 kg/m  Wt Readings from Last 3 Encounters:  04/02/21 189 lb (85.7 kg)  02/20/21 189 lb (85.7 kg)  02/14/21 188 lb 12.8 oz (85.6 kg)    General: Well-appearing Eyes: sclera anicteric, no redness ENT: oral mucosa moist without lesions, no cervical or supraclavicular lymphadenopathy CV: RRR without murmur, S1/S2, no JVD, no  peripheral edema Resp: clear to auscultation bilaterally, normal RR and effort noted GI: soft, scattered tenderness, with active bowel sounds. No guarding or palpable organomegaly noted.  No bruit, no distention Skin; warm and dry, no rash or jaundice noted Neuro: awake, alert and oriented x 3. Normal gross motor function and fluent speech  Labs:  Recent echocardiogram  1. Left ventricular ejection fraction, by estimation, is 60 to 65%. The  left ventricle has normal function. The left ventricle has no regional  wall motion abnormalities. Left ventricular diastolic parameters were  normal.   2. Right ventricular systolic function is normal. The right ventricular  size is normal. Tricuspid regurgitation signal is inadequate for assessing  PA pressure.   3. The mitral valve is normal in structure. Trivial mitral valve  regurgitation. No evidence of mitral stenosis.   4. The aortic valve was not well visualized. Aortic valve regurgitation  is not visualized. No aortic stenosis is present.   5. The inferior vena cava is normal in size with <50% respiratory  variability, suggesting right atrial pressure of 8 mmHg.    Assessment: Encounter Diagnoses  Name Primary?   Gastroesophageal reflux disease, unspecified whether esophagitis present Yes   Esophageal dysphagia    Irritable bowel  syndrome with diarrhea    Reflux and dysphagia, evaluated several years ago on EGD.  Recurrent symptoms despite acid suppression. Upper endoscopy warranted, she was agreeable after discussion of procedure and risks.  The benefits and risks of the planned procedure were described in detail with the patient or (when appropriate) their health care proxy.  Risks were outlined as including, but not limited to, bleeding, infection, perforation, adverse medication reaction leading to cardiac or pulmonary decompensation, pancreatitis (if ERCP).  The limitation of incomplete mucosal visualization was also discussed.  No guarantees or warranties were given.  Recurrence of IBS symptoms of bloating, gas, loose nonbloody stool after recently running out of her "balance of nature" dietary supplement but she says it was a miracle medicine for her.  She is gone back to taking an occasional tablet of her daughter's mesalamine, which is something she had done before we reported improvement, however it is no longer as effective as it is been.  Previously tested negative for EPI, prefer not to give her empiric antibiotics for the question of SIBO since she previously got C. difficile. I do not think a repeat colonoscopy in this junction will be more revealing for a diagnosis.  Plan: She expects shipment of her dietary supplement in the next few weeks, which she then plans to resume taking since it was so effective for her.  As needed hyoscyamine in the meantime, recently refilled  Upper endoscopy as noted above  Change colonoscopy recall to August 2025.   32 minutes were spent on this encounter (including chart review, history/exam, counseling/coordination of care, and documentation) > 50% of that time was spent on counseling and coordination of care.   Nelida Meuse III  CC: Referring provider noted above

## 2021-04-16 ENCOUNTER — Ambulatory Visit (INDEPENDENT_AMBULATORY_CARE_PROVIDER_SITE_OTHER): Payer: Medicare Other

## 2021-04-16 ENCOUNTER — Ambulatory Visit: Payer: Self-pay

## 2021-04-16 ENCOUNTER — Ambulatory Visit (INDEPENDENT_AMBULATORY_CARE_PROVIDER_SITE_OTHER): Payer: Medicare Other | Admitting: Orthopedic Surgery

## 2021-04-16 ENCOUNTER — Encounter: Payer: Self-pay | Admitting: Orthopedic Surgery

## 2021-04-16 ENCOUNTER — Other Ambulatory Visit: Payer: Self-pay

## 2021-04-16 DIAGNOSIS — G8929 Other chronic pain: Secondary | ICD-10-CM

## 2021-04-16 DIAGNOSIS — M79642 Pain in left hand: Secondary | ICD-10-CM

## 2021-04-16 DIAGNOSIS — M19042 Primary osteoarthritis, left hand: Secondary | ICD-10-CM | POA: Diagnosis not present

## 2021-04-16 DIAGNOSIS — M25579 Pain in unspecified ankle and joints of unspecified foot: Secondary | ICD-10-CM

## 2021-04-19 ENCOUNTER — Encounter: Payer: Self-pay | Admitting: Orthopedic Surgery

## 2021-04-19 MED ORDER — LIDOCAINE HCL 1 % IJ SOLN
5.0000 mL | INTRAMUSCULAR | Status: AC | PRN
  Administered 2021-04-16: 5 mL

## 2021-04-19 MED ORDER — METHYLPREDNISOLONE ACETATE 40 MG/ML IJ SUSP
10.0000 mg | INTRAMUSCULAR | Status: AC | PRN
  Administered 2021-04-16: 10 mg

## 2021-04-19 NOTE — Progress Notes (Signed)
? ?Office Visit Note ?  ?Patient: Joann [name]           ?Date of Birth: 31-Aug-1950           ?MRN: [id number] ?Visit Date: 04/16/2021 ?Requested by: [facility] ?[address] [place] [address] ?PCP: [facility] ? ?Subjective: ?Chief Complaint  ?Patient presents with  ? Left Hand - Follow-up  ? ? ?HPI: Patient presents for evaluation of left hand middle finger DIP pain.  Has a known history of arthritis in this region.  She works as a Brewing technologist.  Taking tramadol for pain.  Denies any injury to the finger.  Also describes left ankle pain.  She has bad ankle arthritis.  She is considering fusion versus ankle replacement.  The ankle arthroscopy was marginal in terms of pain relief. ?             ?ROS: All systems reviewed are negative as they relate to the chief complaint within the history of present illness.  Patient denies  fevers or chills. ? ? ?Assessment & Plan: ?Visit Diagnoses:  ?1. Pain in left hand   ?2. Chronic ankle pain, unspecified laterality   ? ? ?Plan: Impression is left hand middle finger DIP arthritis.  Under ultrasound guidance injection performed today.  This may help her temporarily.  Alternatively she may need to consider DIP fusion for persistent symptoms.  Regarding the ankle I think she does have history of significant arthritis.  Recommended that she consider orthopedic surgical evaluation for relatively complex ankle replacement.  Plan to send her to Dr. Doran Durand who trained at [facility] where they do many of these ankle replacements.  She will follow-up with me as needed. ? ?Follow-Up Instructions: No follow-ups on file.  ? ?Orders:  ?Orders Placed This Encounter  ?Procedures  ? XR Hand Complete Left  ? US Guided Needle Placement - No Linked Charges  ? Ambulatory referral to Orthopedic Surgery  ? ?No orders of the defined types were placed in this encounter. ? ? ? ? Procedures: ?Hand/UE Inj for osteoarthritis on 04/16/2021 6:54 AM ?Details: 25 G needle,  ultrasound-guided radial approach ?Medications: 5 mL lidocaine 1 %; 10 mg methylPREDNISolone acetate 40 MG/ML ? ?DIP joint left long finger ? ? ? ? ?Clinical Data: ?No additional findings. ? ?Objective: ?Vital Signs: There were no vitals taken for this visit. ? ?Physical Exam:  ? ?Constitutional: Patient appears well-developed ?HEENT:  ?Head: Normocephalic ?Eyes:EOM are normal ?Neck: Normal range of motion ?Cardiovascular: Normal rate ?Pulmonary/chest: Effort normal ?Neurologic: Patient is alert ?Skin: Skin is warm ?Psychiatric: Patient has normal mood and affect ? ? ?Ortho Exam: Ortho exam demonstrates PIP restricted range of motion of 5 to 30 degrees of motion.  Collaterals are stable.  MCP and PIP joint range of motion on that middle finger intact.  Nailbed without injury.  Left ankle is examined and she has well-healed arthroscopy portal incisions.  Her ankle range of motion is actually pretty reasonable with 15 degrees of dorsiflexion and 25 degrees of plantarflexion.  Subtalar transverse tarsal motion also intact.  Palpable intact nontender anterior to posterior to peroneal and Achilles tendons. ? ?Specialty Comments:  ?No specialty comments available. ? ?Imaging: ?No results found. ? ? ?PMFS History: ?Patient Active Problem List  ? Diagnosis Date Noted  ? Right upper quadrant pain 08/21/2017  ? Nausea without vomiting 08/21/2017  ? Hyperlipidemia 01/01/2017  ? Pre-diabetes 01/01/2017  ? Shortness of breath 01/01/2017  ? Palpitations 12/22/2016  ?  Chest pain 12/19/2016  ? Hemorrhoids 12/19/2016  ? ?Past Medical History:  ?Diagnosis Date  ? Chronic diarrhea   ? due to IBS  ? Dyslipidemia   ? GERD (gastroesophageal reflux disease)   ? Hiatal hernia   ? History of palpitations   ? History of skin cancer   ? nose  ? IBS (irritable bowel syndrome)   ? OA (osteoarthritis)   ? knees and lower back  ? OAB (overactive bladder)   ? Prolapsed internal hemorrhoids, grade 3   ? Wears contact lenses   ? Wears dentures   ?  UPPER  ?  ?Family History  ?Problem Relation Age of Onset  ? Alzheimer's disease Mother   ? Colon cancer Neg Hx   ? Stomach cancer Neg Hx   ? Rectal cancer Neg Hx   ? Esophageal cancer Neg Hx   ? Liver cancer Neg Hx   ?  ?Past Surgical History:  ?Procedure Laterality Date  ? ABDOMINAL HYSTERECTOMY  1996  ?  W/ BSO  ? [place]  ? BUNIONECTOMY WITH HAMMERTOE RECONSTRUCTION Right 07-08-2014    @ [facility]  ? 1st toe bunionectomy;  2nd toe hammertoe correction  ? CARDIOVASCULAR STRESS TEST  12-20-2016    [facility] , care everywhere in epic  ? norma nuclear perfusion study w/ no ischemia/  normal LV function and wall motion , ef 71%  ? CATARACT EXTRACTION, BILATERAL    ? COLONOSCOPY  07/2011  ? [facility]   ? FOOT SURGERY    ? 2 bone spurs removed  ? FOOT SURGERY Left 2022  ? arthroscopy for arthritis  ? FOREIGN BODY REMOVAL Right 02/27/2016  ? Procedure: REMOVAL FOREIGN BODY EXTREMITY;  Surgeon: Meredith Pel, MD;  Location: [facility];  Service: Orthopedics;  Laterality: Right;  ? HEMORRHOID SURGERY N/A 09/05/2017  ? Procedure: TWO COLUMN HEMORRHOIDECTOMY, HEMROIDPEXY;  Surgeon: Leighton Ruff, MD;  Location: [facility];  Service: General;  Laterality: N/A;  ? KNEE ARTHROPLASTY  01/15/2011  ? Procedure: COMPUTER ASSISTED TOTAL KNEE ARTHROPLASTY;  Surgeon: Meredith Pel;  Location: [facility];  Service: Orthopedics;  Laterality: Right;  Right total knee arthroplasty  ? KNEE ARTHROSCOPY Left 12/2009  ? NASAL SINUS SURGERY  2017  ? TONSILLECTOMY  age 20  ? TOTAL KNEE ARTHROPLASTY Left 11-27-2010    dr Neeya Prigmore   [facility]  ? ?Social History  ? ?Occupational History  ? Occupation: court reporter-semi retired  ?Tobacco Use  ? Smoking status: Former  ?  Packs/day: 0.50  ?  Years: 16.00  ?  Pack years: 8.00  ?  Types: Cigarettes  ?  Quit date: 01/11/1991  ?  Years since quitting: 30.2  ? Smokeless tobacco: Never  ?Vaping Use  ? Vaping Use: Never used  ?Substance and Sexual  Activity  ? Alcohol use: No  ? Drug use: No  ? Sexual activity: Yes  ?  Birth control/protection: Surgical  ? ? ? ? ? ?

## 2021-05-02 ENCOUNTER — Other Ambulatory Visit: Payer: Self-pay | Admitting: Gastroenterology

## 2021-05-10 ENCOUNTER — Telehealth: Payer: Self-pay | Admitting: Gastroenterology

## 2021-05-10 NOTE — Telephone Encounter (Signed)
Hey Dr. Loletha Carrow,  ? ?Patient called in to cancel procedure 4/17 due to being out of town. Patient rescheduled for 4/24. ? ?Thank you ?

## 2021-05-14 ENCOUNTER — Encounter: Payer: Medicare Other | Admitting: Gastroenterology

## 2021-05-18 ENCOUNTER — Institutional Professional Consult (permissible substitution): Payer: Medicare Other | Admitting: Cardiology

## 2021-05-21 ENCOUNTER — Ambulatory Visit (AMBULATORY_SURGERY_CENTER): Payer: Medicare Other | Admitting: Gastroenterology

## 2021-05-21 ENCOUNTER — Encounter: Payer: Self-pay | Admitting: Gastroenterology

## 2021-05-21 VITALS — BP 123/75 | HR 71 | Temp 97.1°F | Resp 18 | Ht 63.0 in | Wt 189.0 lb

## 2021-05-21 DIAGNOSIS — K219 Gastro-esophageal reflux disease without esophagitis: Secondary | ICD-10-CM | POA: Diagnosis not present

## 2021-05-21 DIAGNOSIS — R1310 Dysphagia, unspecified: Secondary | ICD-10-CM

## 2021-05-21 MED ORDER — SODIUM CHLORIDE 0.9 % IV SOLN: 500.0000 mL | Freq: Once | INTRAVENOUS | Status: DC

## 2021-05-21 NOTE — Op Note (Signed)
[place] ?Patient Name: Joann [name] ?Procedure Date: 05/21/2021 11:20 AM ?MRN: [id number] ?Endoscopist: Estill Cotta. Loletha Carrow , MD ?Age: 71 ?Referring MD:  ?Date of Birth: [birth year] ?Gender: Female ?Account #: [id number] ?Procedure:                Upper GI endoscopy ?Indications:              Esophageal dysphagia, Esophageal reflux symptoms  ?                          that persist despite appropriate therapy ?Medicines:                Monitored Anesthesia Care ?Procedure:                Pre-Anesthesia Assessment: ?                          - Prior to the procedure, a History and Physical  ?                          was performed, and patient medications and  ?                          allergies were reviewed. The patient's tolerance of  ?                          previous anesthesia was also reviewed. The risks  ?                          and benefits of the procedure and the sedation  ?                          options and risks were discussed with the patient.  ?                          All questions were answered, and informed consent  ?                          was obtained. Prior Anticoagulants: The patient has  ?                          taken no previous anticoagulant or antiplatelet  ?                          agents. ASA Grade Assessment: II - A patient with  ?                          mild systemic disease. After reviewing the risks  ?                          and benefits, the patient was deemed in  ?                          satisfactory condition to undergo the procedure. ?  After obtaining informed consent, the endoscope was  ?                          passed under direct vision. Throughout the  ?                          procedure, the patient's blood pressure, pulse, and  ?                          oxygen saturations were monitored continuously. The  ?                          Endoscope was introduced through the mouth, and  ?                          advanced to the  second part of duodenum. The upper  ?                          GI endoscopy was accomplished without difficulty.  ?                          The patient tolerated the procedure well. ?Scope In: ?Scope Out: ?Findings:                 The examined esophagus was normal. The scope was  ?                          withdrawn. Dilation was performed with a Venia Minks  ?                          dilator with no resistance at 32 Fr. ?                          The stomach was normal. ?                          The cardia and gastric fundus were normal on  ?                          retroflexion. ?                          The examined duodenum was normal. ?Complications:            No immediate complications. ?Estimated Blood Loss:     Estimated blood loss: none. ?Impression:               - Normal esophagus. Dilated. ?                          - Normal stomach. ?                          - Normal examined duodenum. ?                          - No specimens collected. ?  No stricture seen. Empiric dilation performed.  ?                          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                          explains dysphagia. ?Recommendation:           - Patient has a contact number available for  ?                          emergencies. The signs and symptoms of potential  ?                          delayed complications were discussed with the  ?                          patient. Return to normal activities tomorrow.  ?                          Written discharge instructions were provided to the  ?                          patient. ?                          - Resume previous diet. ?                          - Continue present medications. ?                          - Follow an antireflux regimen indefinitely. This  ?                          is at least as important as medicine to control  ?                          GERD symptoms. ?Chevelle Durr L. Loletha Carrow, MD ?05/21/2021 11:37:39 AM ?This report  has been signed electronically. ?

## 2021-05-21 NOTE — Progress Notes (Signed)
History and Physical: ? This patient presents for endoscopic testing for: ?Encounter Diagnosis  ?Name Primary?  ? Gastroesophageal reflux disease, unspecified whether esophagitis present Yes  ? ? ?Clinical details in 04/02/21 [facility] office note. ?GERD and dysphagia ?Chronic IBS-D as well ? ?ROS: ?Patient denies chest pain or shortness of breath ? ? ?Past Medical History: ?Past Medical History:  ?Diagnosis Date  ? Chronic diarrhea   ? due to IBS  ? Dyslipidemia   ? GERD (gastroesophageal reflux disease)   ? Hiatal hernia   ? History of palpitations   ? History of skin cancer   ? nose  ? IBS (irritable bowel syndrome)   ? OA (osteoarthritis)   ? knees and lower back  ? OAB (overactive bladder)   ? Prolapsed internal hemorrhoids, grade 3   ? Wears contact lenses   ? Wears dentures   ? UPPER  ? ? ? ?Past Surgical History: ?Past Surgical History:  ?Procedure Laterality Date  ? ABDOMINAL HYSTERECTOMY  1996  ?  W/ BSO  ? [place]  ? BUNIONECTOMY WITH HAMMERTOE RECONSTRUCTION Right 07-08-2014    @ [facility]  ? 1st toe bunionectomy;  2nd toe hammertoe correction  ? CARDIOVASCULAR STRESS TEST  12-20-2016    [facility] , care everywhere in epic  ? norma nuclear perfusion study w/ no ischemia/  normal LV function and wall motion , ef 71%  ? CATARACT EXTRACTION, BILATERAL    ? COLONOSCOPY  07/2011  ? [facility]   ? FOOT SURGERY    ? 2 bone spurs removed  ? FOOT SURGERY Left 2022  ? arthroscopy for arthritis  ? FOREIGN BODY REMOVAL Right 02/27/2016  ? Procedure: REMOVAL FOREIGN BODY EXTREMITY;  Surgeon: Meredith Pel, MD;  Location: [place];  Service: Orthopedics;  Laterality: Right;  ? HEMORRHOID SURGERY N/A 09/05/2017  ? Procedure: TWO COLUMN HEMORRHOIDECTOMY, HEMROIDPEXY;  Surgeon: Leighton Ruff, MD;  Location: [facility];  Service: General;  Laterality: N/A;  ? KNEE ARTHROPLASTY  01/15/2011  ? Procedure: COMPUTER ASSISTED TOTAL KNEE ARTHROPLASTY;  Surgeon: Meredith Pel;  Location: [place];  Service: Orthopedics;  Laterality: Right;  Right total knee arthroplasty  ? KNEE ARTHROSCOPY Left 12/2009  ? NASAL SINUS SURGERY  2017  ? TONSILLECTOMY  age 71  ? TOTAL KNEE ARTHROPLASTY Left 11-27-2010    dr dean   [facility]  ? ? ?Allergies: ?No Known Allergies ? ?Outpatient Meds: ?Current Outpatient Medications  ?Medication Sig Dispense Refill  ? AMBULATORY NON FORMULARY MEDICATION Take by mouth daily. Balance of nature: 3 veggie and 3 fruit capsules daily    ? hyoscyamine (LEVSIN SL) 0.125 MG SL tablet DISSOLVE 2 TABLETS IN MOUTH EVERY 6 HOURS AS NEEDED 60 tablet 1  ? mesalamine (APRISO) 0.375 g 24 hr capsule Take 4 capsules (1.5 g total) by mouth daily. 120 capsule 2  ? omeprazole (PRILOSEC OTC) 20 MG tablet Take 20 mg by mouth daily.    ? ?Current Facility-Administered Medications  ?Medication Dose Route Frequency Provider Last Rate Last Admin  ? 0.9 %  sodium chloride infusion  500 mL Intravenous Once Doran Stabler, MD      ? ? ? ? ?___________________________________________________________________ ?Objective  ? ?Exam: ? ?BP (!) 150/95   Pulse 76   Temp (!) 97.1 ?F (36.2 ?C) (Temporal)   Ht 5' 3"  (1.6 m)   Wt 189 lb (85.7 kg)   SpO2 97%   BMI 33.48 kg/m?  ? ?  CV: RRR without murmur, S1/S2 ?Resp: clear to auscultation bilaterally, normal RR and effort noted ?GI: soft, no tenderness, with active bowel sounds. ? ? ?Assessment: ?Encounter Diagnosis  ?Name Primary?  ? Gastroesophageal reflux disease, unspecified whether esophagitis present Yes  ? ? ? ?Plan: ?EGD with possible dilation ? ? ?The patient is appropriate for an endoscopic procedure in the ambulatory setting. ? ? - Wilfrid Lund, MD ? ? ? ? ?

## 2021-05-21 NOTE — Progress Notes (Signed)
To Pacu, VSS. Report to Rn.tb ?

## 2021-05-21 NOTE — Patient Instructions (Signed)
Handouts Provided:  Reflux Diet ? ?YOU HAD AN ENDOSCOPIC PROCEDURE TODAY AT [facility]:   Refer to the procedure report that was given to you for any specific questions about what was found during the examination.  If the procedure report does not answer your questions, please call your gastroenterologist to clarify.  If you requested that your care partner not be given the details of your procedure findings, then the procedure report has been included in a sealed envelope for you to review at your convenience later. ? ?YOU SHOULD EXPECT: Some feelings of bloating in the abdomen. Passage of more gas than usual.  Walking can help get rid of the air that was put into your GI tract during the procedure and reduce the bloating. If you had a lower endoscopy (such as a colonoscopy or flexible sigmoidoscopy) you may notice spotting of blood in your stool or on the toilet paper. If you underwent a bowel prep for your procedure, you may not have a normal bowel movement for a few days. ? ?Please Note:  You might notice some irritation and congestion in your nose or some drainage.  This is from the oxygen used during your procedure.  There is no need for concern and it should clear up in a day or so. ? ?SYMPTOMS TO REPORT IMMEDIATELY: ? ?Following upper endoscopy (EGD) ? Vomiting of blood or coffee ground material ? New chest pain or pain under the shoulder blades ? Painful or persistently difficult swallowing ? New shortness of breath ? Fever of 100?F or higher ? Black, tarry-looking stools ? ?For urgent or emergent issues, a gastroenterologist can be reached at any hour by calling [phone]. ?Do not use MyChart messaging for urgent concerns.  ? ? ?DIET:  We do recommend a small meal at first, but then you may proceed to your regular diet.  Drink plenty of fluids but you should avoid alcoholic beverages for 24 hours. ? ?ACTIVITY:  You should plan to take it easy for the rest of today and you should NOT  DRIVE or use heavy machinery until tomorrow (because of the sedation medicines used during the test).   ? ?FOLLOW UP: ?Our staff will call the number listed on your records 48-72 hours following your procedure to check on you and address any questions or concerns that you may have regarding the information given to you following your procedure. If we do not reach you, we will leave a message.  We will attempt to reach you two times.  During this call, we will ask if you have developed any symptoms of COVID 19. If you develop any symptoms (ie: fever, flu-like symptoms, shortness of breath, cough etc.) before then, please call [phone].  If you test positive for Covid 19 in the 2 weeks post procedure, please call and report this information to [place].   ? ?If any biopsies were taken you will be contacted by phone or by letter within the next 1-3 weeks.  Please call us at [phone] if you have not heard about the biopsies in 3 weeks.  ? ? ?SIGNATURES/CONFIDENTIALITY: ?You and/or your care partner have signed paperwork which will be entered into your electronic medical record.  These signatures attest to the fact that that the information above on your After Visit Summary has been reviewed and is understood.  Full responsibility of the confidentiality of this discharge information lies with you and/or your care-partner. ? ?

## 2021-05-21 NOTE — Progress Notes (Signed)
VS completed by CW.   Pt's states no medical or surgical changes since previsit or office visit.  

## 2021-05-23 ENCOUNTER — Telehealth: Payer: Self-pay | Admitting: *Deleted

## 2021-05-23 ENCOUNTER — Telehealth: Payer: Self-pay

## 2021-05-23 NOTE — Telephone Encounter (Signed)
?  Follow up Call- ? ? ?  05/21/2021  ? 10:51 AM  ?Call back number  ?Post procedure Call Back phone  # [phone]  ?Permission to leave phone message Yes  ?  ? ?Patient questions: ? ?Do you have a fever, pain , or abdominal swelling? No. ?Pain Score  0 * ? ?Have you tolerated food without any problems? Yes.   ? ?Have you been able to return to your normal activities? Yes.   ? ?Do you have any questions about your discharge instructions: ?Diet   No. ?Medications  No. ?Follow up visit  No. ? ?Do you have questions or concerns about your Care? No. ? ?Actions: ?* If pain score is 4 or above: ?No action needed, pain <4. ? ? ?

## 2021-05-23 NOTE — Telephone Encounter (Signed)
?  Follow up Call- ? ? ?  05/21/2021  ? 10:51 AM  ?Call back number  ?Post procedure Call Back phone  # [phone]  ?Permission to leave phone message Yes  ? First follow-up call, LVM ?

## 2021-09-13 ENCOUNTER — Ambulatory Visit (INDEPENDENT_AMBULATORY_CARE_PROVIDER_SITE_OTHER): Payer: Medicare Other

## 2021-09-13 ENCOUNTER — Ambulatory Visit (INDEPENDENT_AMBULATORY_CARE_PROVIDER_SITE_OTHER): Payer: Medicare Other | Admitting: Surgical

## 2021-09-13 ENCOUNTER — Encounter: Payer: Self-pay | Admitting: Orthopedic Surgery

## 2021-09-13 ENCOUNTER — Ambulatory Visit: Payer: Medicare Other

## 2021-09-13 DIAGNOSIS — M25512 Pain in left shoulder: Secondary | ICD-10-CM | POA: Diagnosis not present

## 2021-09-13 DIAGNOSIS — M7502 Adhesive capsulitis of left shoulder: Secondary | ICD-10-CM

## 2021-09-13 MED ORDER — METHYLPREDNISOLONE ACETATE 40 MG/ML IJ SUSP
40.0000 mg | INTRAMUSCULAR | Status: AC | PRN
  Administered 2021-09-13: 40 mg via INTRA_ARTICULAR

## 2021-09-13 MED ORDER — BUPIVACAINE HCL 0.5 % IJ SOLN
9.0000 mL | INTRAMUSCULAR | Status: AC | PRN
  Administered 2021-09-13: 9 mL via INTRA_ARTICULAR

## 2021-09-13 MED ORDER — LIDOCAINE HCL 1 % IJ SOLN
5.0000 mL | INTRAMUSCULAR | Status: AC | PRN
  Administered 2021-09-13: 5 mL

## 2021-09-13 NOTE — Progress Notes (Signed)
Office Visit Note   Patient: Joann [name]           Date of Birth: 08/23/1950           MRN: [id number] Visit Date: 09/13/2021 Requested by: [facility] [address] [place] [address] PCP: [facility]  Subjective: Chief Complaint  Patient presents with   Left Shoulder - Pain    HPI: Joann [name] is a 71 y.o. female who presents to the office complaining of left shoulder pain.  Patient notes pain over the last 2 months.  First noticed it while she was gardening without any sort of injury or acute event.  Pain has progressively worsened to the point where it wakes her up at night and she cannot lay on her left side.  She is right-hand dominant.  She has no history of prior injury, surgery, dislocation to the left shoulder.  No catching or mechanical symptoms.  Hard to put her bra on in the morning.  She has more pain rather than weakness.  Pain will travel from the anterior lateral aspect of the left shoulder down into the elbow.  No numbness or tingling.  She does have history of chronic neck pain and chronic thoracic spine pain in the medial border the scapula both sides.  Takes Tylenol and occasional tramadol for this pain.  Worst position is with her arm in an abducted and externally rotated position when she tries to do her hair.  No history of diabetes, smoking, blood thinner use, thyroid disease.  Does have history of IBS..                ROS: All systems reviewed are negative as they relate to the chief complaint within the history of present illness.  Patient denies fevers or chills.  Assessment & Plan: Visit Diagnoses:  1. Adhesive capsulitis of left shoulder   2. Left shoulder pain, unspecified chronicity     Plan: Patient is a 71 year old female who presents for evaluation of left shoulder pain.  She has slowly progressing left shoulder pain over the last 2 months with associated stiffness with passive range of motion of the left shoulder that is  noted on exam relative to the right.  She also has a slight amount of rotator cuff weakness of the left shoulder compared with the right of supra/infra/subscap but this may be related to pain she is experiencing.  Left shoulder radiographs demonstrate no significant joint space narrowing the glenohumeral joint or any significant findings aside from mild to moderate AC joint arthritis.  Impression is left shoulder adhesive capsulitis with some possibility of rotator cuff pathology.  With her atraumatic onset of progressively worsening pain, suspect that adhesive capsulitis is the main pain driver for [place].  Under ultrasound guidance, glenohumeral joint injection was administered and patient tolerated procedure well.  She was given shoulder range of motion exercises that she will do for about 30 minutes a day.  Follow-up in 6 weeks for clinical recheck primarily on her level of pain and range of motion/rotator cuff strength..  She was recommended to call the office in 2 weeks if she has no relief and we can arrange an MRI arthrogram of the left shoulder.  Follow-Up Instructions: No follow-ups on file.   Orders:  Orders Placed This Encounter  Procedures   XR Shoulder Left   US Guided Needle Placement - No Linked Charges   No orders of the defined types were placed in  this encounter.     Procedures: Large Joint Inj: L glenohumeral on 09/13/2021 1:06 PM Indications: diagnostic evaluation and pain Details: 22 G 3.5 in needle, ultrasound-guided posterior approach  Arthrogram: No  Medications: 9 mL bupivacaine 0.5 %; 40 mg methylPREDNISolone acetate 40 MG/ML; 5 mL lidocaine 1 % Outcome: tolerated well, no immediate complications Procedure, treatment alternatives, risks and benefits explained, specific risks discussed. Consent was given by the patient. Immediately prior to procedure a time out was called to verify the correct patient, procedure, equipment, support staff and site/side marked as  required. Patient was prepped and draped in the usual sterile fashion.       Clinical Data: No additional findings.  Objective: Vital Signs: There were no vitals taken for this visit.  Physical Exam:  Constitutional: Patient appears well-developed HEENT:  Head: Normocephalic Eyes:EOM are normal Neck: Normal range of motion Cardiovascular: Normal rate Pulmonary/chest: Effort normal Neurologic: Patient is alert Skin: Skin is warm Psychiatric: Patient has normal mood and affect  Ortho Exam: Ortho exam demonstrates left shoulder with 50 degrees external rotation, 70 degrees abduction, 140 degrees forward flexion.  This compared with the right shoulder with 70 degrees X rotation, 95 degrees abduction, 170 degrees forward flexion.  5 -/5 infraspinatus, supraspinatus, subscapularis strength of the left shoulder relative to the right which has 5/5 strength.  Mild to moderate tenderness of the bicipital groove of the left shoulder.  No tenderness over the [facility] joint.  No tenderness over the axial cervical spine.  5/5 motor strength of bilateral grip strength, finger abduction, pronation/supination, bicep, tricep, deltoid.  Active range of motion equivalent to passive range of motion.  Specialty Comments:  No specialty comments available.  Imaging: No results found.   PMFS History: Patient Active Problem List   Diagnosis Date Noted   Right upper quadrant pain 08/21/2017   Nausea without vomiting 08/21/2017   Hyperlipidemia 01/01/2017   Pre-diabetes 01/01/2017   Shortness of breath 01/01/2017   Palpitations 12/22/2016   Chest pain 12/19/2016   Hemorrhoids 12/19/2016   Past Medical History:  Diagnosis Date   Chronic diarrhea    due to IBS   Dyslipidemia    GERD (gastroesophageal reflux disease)    Hiatal hernia    History of palpitations    History of skin cancer    nose   IBS (irritable bowel syndrome)    OA (osteoarthritis)    knees and lower back   OAB (overactive  bladder)    Prolapsed internal hemorrhoids, grade 3    Wears contact lenses    Wears dentures    UPPER    Family History  Problem Relation Age of Onset   Alzheimer's disease Mother    Colon cancer Neg Hx    Stomach cancer Neg Hx    Rectal cancer Neg Hx    Esophageal cancer Neg Hx    Liver cancer Neg Hx     Past Surgical History:  Procedure Laterality Date   ABDOMINAL HYSTERECTOMY  1996    W/ BSO   BREAST ENHANCEMENT SURGERY  1985   BUNIONECTOMY WITH HAMMERTOE RECONSTRUCTION Right 07-08-2014    @ [facility]   1st toe bunionectomy;  2nd toe hammertoe correction   CARDIOVASCULAR STRESS TEST  12-20-2016    [facility] , care everywhere in epic   norma nuclear perfusion study w/ no ischemia/  normal LV function and wall motion , ef 71%   CATARACT EXTRACTION, BILATERAL     COLONOSCOPY  07/2011  [facility]    FOOT SURGERY     2 bone spurs removed   FOOT SURGERY Left 2022   arthroscopy for arthritis   FOREIGN BODY REMOVAL Right 02/27/2016   Procedure: REMOVAL FOREIGN BODY EXTREMITY;  Surgeon: Meredith Pel, MD;  Location: [place];  Service: Orthopedics;  Laterality: Right;   HEMORRHOID SURGERY N/A 09/05/2017   Procedure: TWO COLUMN HEMORRHOIDECTOMY, HEMROIDPEXY;  Surgeon: Leighton Ruff, MD;  Location[facility];  Service: General;  Laterality: N/A;   KNEE ARTHROPLASTY  01/15/2011   Procedure: COMPUTER ASSISTED TOTAL KNEE ARTHROPLASTY;  Surgeon: Meredith Pel;  Location: [place];  Service: Orthopedics;  Laterality: Right;  Right total knee arthroplasty   KNEE ARTHROSCOPY Left 12/2009   NASAL SINUS SURGERY  2017   TONSILLECTOMY  age 75   TOTAL KNEE ARTHROPLASTY Left 11-27-2010    dr dean   [place]   Occupational History   Occupation: court reporter-semi retired  Tobacco Use   Smoking status: Former    Packs/day: 0.50    Years: 16.00    Total pack years: 8.00    Types: Cigarettes    Quit date: 01/11/1991    Years since  quitting: 30.6   Smokeless tobacco: Never  Vaping Use   Vaping Use: Never used  Substance and Sexual Activity   Alcohol use: No   Drug use: No   Sexual activity: Yes    Birth control/protection: Surgical

## 2021-09-26 ENCOUNTER — Other Ambulatory Visit: Payer: Self-pay | Admitting: Gastroenterology

## 2021-10-25 ENCOUNTER — Ambulatory Visit: Payer: Medicare Other | Admitting: Orthopedic Surgery

## 2021-10-27 ENCOUNTER — Other Ambulatory Visit: Payer: Self-pay | Admitting: Gastroenterology

## 2021-11-14 ENCOUNTER — Ambulatory Visit (INDEPENDENT_AMBULATORY_CARE_PROVIDER_SITE_OTHER): Payer: Medicare Other | Admitting: Orthopedic Surgery

## 2021-11-14 DIAGNOSIS — M7502 Adhesive capsulitis of left shoulder: Secondary | ICD-10-CM

## 2021-11-14 DIAGNOSIS — M25512 Pain in left shoulder: Secondary | ICD-10-CM

## 2021-11-15 ENCOUNTER — Encounter: Payer: Self-pay | Admitting: Orthopedic Surgery

## 2021-11-15 NOTE — Progress Notes (Signed)
Office Visit Note   Patient: Joann [name]           Date of Birth: 07/14/1950           MRN: [id number] Visit Date: 11/14/2021 Requested by: [facility] [address] [place] [address] [facility]CP: [facility]  Subjective: Chief Complaint  Patient presents with   Left Shoulder - Pain    HPI: Joann [name] is a 71 y.o. female who presents to the office reporting left shoulder pain.  She states that she only got about 50% relief from the last injection.  She describes decreased range of motion.  Tylenol works better for the pain.  She denies any mechanical symptoms by history and does not report any neck pain.  Hard for her to brush her hair.  Denies any history of injury.  She is right-hand dominant..                ROS: All systems reviewed are negative as they relate to the chief complaint within the history of present illness.  Patient denies fevers or chills.  Assessment & Plan: Visit Diagnoses:  1. Left shoulder pain, unspecified chronicity   2. Adhesive capsulitis of left shoulder     Plan: Impression is left shoulder pain which could be a degenerative rotator cuff tear or biceps tendinopathy.  Range of motion looks pretty reasonable today on examination.  Patient has had symptoms for longer than 3 months with failure of conservative treatment including an injection as well as home exercise program of stretching and strengthening.  MRI arthrogram of the left shoulder indicated to evaluate for rotator cuff tear.  Follow-up after that study.  Follow-Up Instructions: No follow-ups on file.   Orders:  Orders Placed This Encounter  Procedures   MR Shoulder Left w/ contrast   Arthrogram   No orders of the defined types were placed in this encounter.     Procedures: No procedures performed   Clinical Data: No additional findings.  Objective: Vital Signs: There were no vitals taken for this visit.  Physical Exam:  Constitutional: Patient appears  well-developed HEENT:  Head: Normocephalic Eyes:EOM are normal Neck: Normal range of motion Cardiovascular: Normal rate Pulmonary/chest: Effort normal Neurologic: Patient is alert Skin: Skin is warm Psychiatric: Patient has normal mood and affect  Ortho Exam: Ortho exam demonstrates range of motion on the left of 70/100/170.  Motor or sensory function to the hand is intact.  Rotator cuff strength is fairly reasonable to infraspinatus supraspinatus and subscap muscle testing with slight positive O'Brien's testing on the left compared to the right.  No discrete AC joint tenderness is present.  Not too much coarseness with internal/external rotation of the arm at 90 degrees of abduction.  Specialty Comments:  No specialty comments available.  Imaging: No results found.   PMFS History: Patient Active Problem List   Diagnosis Date Noted   Right upper quadrant pain 08/21/2017   Nausea without vomiting 08/21/2017   Hyperlipidemia 01/01/2017   Pre-diabetes 01/01/2017   Shortness of breath 01/01/2017   Palpitations 12/22/2016   Chest pain 12/19/2016   Hemorrhoids 12/19/2016   Past Medical History:  Diagnosis Date   Chronic diarrhea    due to IBS   Dyslipidemia    GERD (gastroesophageal reflux disease)    Hiatal hernia    History of palpitations    History of skin cancer    nose   IBS (irritable bowel syndrome)    OA (  osteoarthritis)    knees and lower back   OAB (overactive bladder)    Prolapsed internal hemorrhoids, grade 3    Wears contact lenses    Wears dentures    UPPER    Family History  Problem Relation Age of Onset   Alzheimer's disease Mother    Colon cancer Neg Hx    Stomach cancer Neg Hx    Rectal cancer Neg Hx    Esophageal cancer Neg Hx    Liver cancer Neg Hx     Past Surgical History:  Procedure Laterality Date   ABDOMINAL HYSTERECTOMY  1996    W/ BSO   BREAST ENHANCEMENT SURGERY  1985   BUNIONECTOMY WITH HAMMERTOE RECONSTRUCTION Right 07-08-2014     @ [facility]   1st toe bunionectomy;  2nd toe hammertoe correction   CARDIOVASCULAR STRESS TEST  12-20-2016    [facility] , care everywhere in epic   norma nuclear perfusion study w/ no ischemia/  normal LV function and wall motion , ef 71%   CATARACT EXTRACTION, BILATERAL     COLONOSCOPY  07/2011   [facility]    FOOT SURGERY     2 bone spurs removed   FOOT SURGERY Left 2022   arthroscopy for arthritis   FOREIGN BODY REMOVAL Right 02/27/2016   Procedure: REMOVAL FOREIGN BODY EXTREMITY;  Surgeon: Meredith Pel, MD;  Location: [place];  Service: Orthopedics;  Laterality: Right;   HEMORRHOID SURGERY N/A 09/05/2017   Procedure: TWO COLUMN HEMORRHOIDECTOMY, HEMROIDPEXY;  Surgeon: Leighton Ruff, MD;  Location: [facility];  Service: General;  Laterality: N/A;   KNEE ARTHROPLASTY  01/15/2011   Procedure: COMPUTER ASSISTED TOTAL KNEE ARTHROPLASTY;  Surgeon: Meredith Pel;  Location: [place];  Service: Orthopedics;  Laterality: Right;  Right total knee arthroplasty   KNEE ARTHROSCOPY Left 12/2009   NASAL SINUS SURGERY  2017   TONSILLECTOMY  age 55   TOTAL KNEE ARTHROPLASTY Left 11-27-2010    dr Demontae Antunes   [place]   Occupational History   Occupation: court reporter-semi retired  Tobacco Use   Smoking status: Former    Packs/day: 0.50    Years: 16.00    Total pack years: 8.00    Types: Cigarettes    Quit date: 01/11/1991    Years since quitting: 30.8   Smokeless tobacco: Never  Vaping Use   Vaping Use: Never used  Substance and Sexual Activity   Alcohol use: No   Drug use: No   Sexual activity: Yes    Birth control/protection: Surgical

## 2021-12-04 ENCOUNTER — Ambulatory Visit
Admission: RE | Admit: 2021-12-04 | Discharge: 2021-12-04 | Disposition: A | Discharge status: Home / Self Care | Payer: Medicare Other | Source: Ambulatory Visit | Attending: Orthopedic Surgery | Admitting: Orthopedic Surgery

## 2021-12-04 DIAGNOSIS — M25512 Pain in left shoulder: Secondary | ICD-10-CM

## 2021-12-04 DIAGNOSIS — M7502 Adhesive capsulitis of left shoulder: Secondary | ICD-10-CM

## 2021-12-04 MED ORDER — IOPAMIDOL (ISOVUE-M 200) INJECTION 41%
15.0000 mL | Freq: Once | INTRAMUSCULAR | Status: AC
  Administered 2021-12-04: 15 mL via INTRA_ARTICULAR

## 2021-12-05 ENCOUNTER — Ambulatory Visit (INDEPENDENT_AMBULATORY_CARE_PROVIDER_SITE_OTHER): Payer: Medicare Other | Admitting: Orthopedic Surgery

## 2021-12-05 ENCOUNTER — Encounter: Payer: Self-pay | Admitting: Orthopedic Surgery

## 2021-12-05 DIAGNOSIS — M19012 Primary osteoarthritis, left shoulder: Secondary | ICD-10-CM | POA: Diagnosis not present

## 2021-12-05 NOTE — Progress Notes (Signed)
Office Visit Note   Patient: Joann [name]           Date of Birth: 05/22/1950           MRN: [id number] Visit Date: 12/05/2021 Requested by: [facility], [facility] [address] [place] [address] PCP: [facility]  Subjective: Chief Complaint  Patient presents with   Other     Scan review    HPI: Joann [name] is a 71 y.o. female who presents to the office reporting left shoulder pain.  Since she was last seen she had an MRI scan of the left shoulder.  That study was reviewed today.  Rotator cuff intact.  Not much thickening of the inferior axillary recess.  There is moderate arthritis present.  No SLAP tear and biceps tendon appears intact.  Patient describes posterior pain which does radiate at times to the elbow.  Does not report any mechanical symptoms in the shoulder.  Hard for her to lay on the left-hand side.  She states in general it hurts all the time.  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  No history of neck surgery.  She does report a little bit of neck pain at times.  Also has some occasional whole hand numbness and tingling.  She did get 50% relief for 2 to 3 days with glenohumeral joint injection..                ROS: All systems reviewed are negative as they relate to the chief complaint within the history of present illness.  Patient denies fevers or chills.  Assessment & Plan: Visit Diagnoses: No diagnosis found.  Plan: Impression is left shoulder arthritis with no evidence of radiculopathy or frozen shoulder.  She does have some limitation of motion which is mild.  Plan at this time is to continue with Tylenol for symptoms.  Come back in January for repeat clinical assessment and possible injection.  She may be looking at shoulder replacement sometime in the future.  Follow-Up Instructions: No follow-ups on file.   Orders:  No orders of the defined types were placed in this encounter.  No orders of the defined types were placed  in this encounter.     Procedures: No procedures performed   Clinical Data: No additional findings.  Objective: Vital Signs: There were no vitals taken for this visit.  Physical Exam:  Constitutional: Patient appears well-developed HEENT:  Head: Normocephalic Eyes:EOM are normal Neck: Normal range of motion Cardiovascular: Normal rate Pulmonary/chest: Effort normal Neurologic: Patient is alert Skin: Skin is warm Psychiatric: Patient has normal mood and affect  Ortho Exam: Ortho exam demonstrates range of motion on the left of 40/85/150.  Range of motion on the right 60/110/165.  She has excellent rotator cuff strength infraspinatus supraspinatus subscap muscle testing on the left-hand side.  No masses lymphadenopathy or skin changes noted in that shoulder girdle region.  No coarse grinding or popping noted.  Neck range of motion is full.  No paresthesias C5-T1.  Specialty Comments:  No specialty comments available.  Imaging: No results found.   PMFS History: Patient Active Problem List   Diagnosis Date Noted   Right upper quadrant pain 08/21/2017   Nausea without vomiting 08/21/2017   Hyperlipidemia 01/01/2017   Pre-diabetes 01/01/2017   Shortness of breath 01/01/2017   Palpitations 12/22/2016   Chest pain 12/19/2016   Hemorrhoids 12/19/2016   Past Medical History:  Diagnosis Date   Chronic  diarrhea    due to IBS   Dyslipidemia    GERD (gastroesophageal reflux disease)    Hiatal hernia    History of palpitations    History of skin cancer    nose   IBS (irritable bowel syndrome)    OA (osteoarthritis)    knees and lower back   OAB (overactive bladder)    Prolapsed internal hemorrhoids, grade 3    Wears contact lenses    Wears dentures    UPPER    Family History  Problem Relation Age of Onset   Alzheimer's disease Mother    Colon cancer Neg Hx    Stomach cancer Neg Hx    Rectal cancer Neg Hx    Esophageal cancer Neg Hx    Liver cancer Neg Hx      Past Surgical History:  Procedure Laterality Date   ABDOMINAL HYSTERECTOMY  1996    W/ BSO   BREAST ENHANCEMENT SURGERY  1985   BUNIONECTOMY WITH HAMMERTOE RECONSTRUCTION Right 07-08-2014    @ [facility]   1st toe bunionectomy;  2nd toe hammertoe correction   CARDIOVASCULAR STRESS TEST  12-20-2016    [facility] , care everywhere in epic   norma nuclear perfusion study w/ no ischemia/  normal LV function and wall motion , ef 71%   CATARACT EXTRACTION, BILATERAL     COLONOSCOPY  07/2011   [facility]    FOOT SURGERY     2 bone spurs removed   FOOT SURGERY Left 2022   arthroscopy for arthritis   FOREIGN BODY REMOVAL Right 02/27/2016   Procedure: REMOVAL FOREIGN BODY EXTREMITY;  Surgeon: Meredith Pel, MD;  Location: [place];  Service: Orthopedics;  Laterality: Right;   HEMORRHOID SURGERY N/A 09/05/2017   Procedure: TWO COLUMN HEMORRHOIDECTOMY, HEMROIDPEXY;  Surgeon: Leighton Ruff, MD;  Location: [facility];  Service: General;  Laterality: N/A;   KNEE ARTHROPLASTY  01/15/2011   Procedure: COMPUTER ASSISTED TOTAL KNEE ARTHROPLASTY;  Surgeon: Meredith Pel;  Location: [place];  Service: Orthopedics;  Laterality: Right;  Right total knee arthroplasty   KNEE ARTHROSCOPY Left 12/2009   NASAL SINUS SURGERY  2017   TONSILLECTOMY  age 62   TOTAL KNEE ARTHROPLASTY Left 11-27-2010    dr Jhaden Pizzuto   [place]   Occupational History   Occupation: court reporter-semi retired  Tobacco Use   Smoking status: Former    Packs/day: 0.50    Years: 16.00    Total pack years: 8.00    Types: Cigarettes    Quit date: 01/11/1991    Years since quitting: 30.9   Smokeless tobacco: Never  Vaping Use   Vaping Use: Never used  Substance and Sexual Activity   Alcohol use: No   Drug use: No   Sexual activity: Yes    Birth control/protection: Surgical

## 2022-02-04 ENCOUNTER — Ambulatory Visit: Payer: Medicare Other | Admitting: Orthopedic Surgery

## 2022-03-13 ENCOUNTER — Encounter: Payer: Self-pay | Admitting: Orthopedic Surgery

## 2022-03-13 ENCOUNTER — Ambulatory Visit (INDEPENDENT_AMBULATORY_CARE_PROVIDER_SITE_OTHER): Payer: Medicare Other | Admitting: Orthopedic Surgery

## 2022-03-13 ENCOUNTER — Telehealth: Payer: Self-pay

## 2022-03-13 DIAGNOSIS — M19012 Primary osteoarthritis, left shoulder: Secondary | ICD-10-CM

## 2022-03-13 MED ORDER — PREDNISONE 5 MG (21) PO TBPK: ORAL_TABLET | ORAL | 0 refills | Status: DC

## 2022-03-13 NOTE — Progress Notes (Signed)
Office Visit Note   Patient: Joann [name]           Date of Birth: 16-Dec-1950           MRN: [id number] Visit Date: 03/13/2022 Requested by: No referring provider defined for this encounter. PCP: Pcp, No  Subjective: Chief Complaint  Patient presents with   Left Shoulder - Follow-up    HPI: Joann [name] is a 72 y.o. female who presents to the office reporting left shoulder pain.  She states the pain is getting worse.  Injection last year gave her about 50% relief for 3 days.  MRI scan shows intact rotator cuff with no evidence of frozen shoulder but she does have some moderate glenohumeral arthritis predominantly on the glenoid side.  She describes pain with extension as well as decreased range of motion behind her back.  No mechanical symptoms.  Denies any paresthesias or neck symptoms.  She does report some whole hand numbness occasionally which is most consistent with carpal tunnel syndrome..                ROS: All systems reviewed are negative as they relate to the chief complaint within the history of present illness.  Patient denies fevers or chills.  Assessment & Plan: Visit Diagnoses:  1. Arthritis of left shoulder region     Plan: Impression is left shoulder stiffness which is most likely related to moderate arthritis.  I do not think she is getting a frozen shoulder.  Plan is Medrol Dosepak 6-day course and follow-up for repeat assessment in 6 weeks.  Follow-Up Instructions: No follow-ups on file.   Orders:  No orders of the defined types were placed in this encounter.  Meds ordered this encounter  Medications   predniSONE (STERAPRED UNI-PAK 21 TAB) 5 MG (21) TBPK tablet    Sig: Take dosepak as directed    Dispense:  21 tablet    Refill:  0      Procedures: No procedures performed   Clinical Data: No additional findings.  Objective: Vital Signs: There were no vitals taken for this visit.  Physical Exam:  Constitutional: Patient appears  well-developed HEENT:  Head: Normocephalic Eyes:EOM are normal Neck: Normal range of motion Cardiovascular: Normal rate Pulmonary/chest: Effort normal Neurologic: Patient is alert Skin: Skin is warm Psychiatric: Patient has normal mood and affect  Ortho Exam: Ortho exam demonstrates range of motion on the right of 70/100/170.  On the left 45/95/160.  Rotator cuff strength is good infraspinatus supraspinatus and subscap muscle testing with no AC joint tenderness negative apprehension testing and no coarse grinding or crepitus with internal/external rotation of the arm at 90 degrees of abduction.  Specialty Comments:  No specialty comments available.  Imaging: No results found.   PMFS History: Patient Active Problem List   Diagnosis Date Noted   Right upper quadrant pain 08/21/2017   Nausea without vomiting 08/21/2017   Hyperlipidemia 01/01/2017   Pre-diabetes 01/01/2017   Shortness of breath 01/01/2017   Palpitations 12/22/2016   Chest pain 12/19/2016   Hemorrhoids 12/19/2016   Past Medical History:  Diagnosis Date   Chronic diarrhea    due to IBS   Dyslipidemia    GERD (gastroesophageal reflux disease)    Hiatal hernia    History of palpitations    History of skin cancer    nose   IBS (irritable bowel syndrome)    OA (osteoarthritis)    knees and lower back   OAB (overactive bladder)  Prolapsed internal hemorrhoids, grade 3    Wears contact lenses    Wears dentures    UPPER    Family History  Problem Relation Age of Onset   Alzheimer's disease Mother    Colon cancer Neg Hx    Stomach cancer Neg Hx    Rectal cancer Neg Hx    Esophageal cancer Neg Hx    Liver cancer Neg Hx     Past Surgical History:  Procedure Laterality Date   ABDOMINAL HYSTERECTOMY  1996    W/ BSO   BREAST ENHANCEMENT SURGERY  1985   BUNIONECTOMY WITH HAMMERTOE RECONSTRUCTION Right 07-08-2014    @ [facility]   1st toe bunionectomy;  2nd toe hammertoe correction    CARDIOVASCULAR STRESS TEST  12-20-2016    [facility] , care everywhere in epic   norma nuclear perfusion study w/ no ischemia/  normal LV function and wall motion , ef 71%   CATARACT EXTRACTION, BILATERAL     COLONOSCOPY  07/2011   [facility]    FOOT SURGERY     2 bone spurs removed   FOOT SURGERY Left 2022   arthroscopy for arthritis   FOREIGN BODY REMOVAL Right 02/27/2016   Procedure: REMOVAL FOREIGN BODY EXTREMITY;  Surgeon: Meredith Pel, MD;  Location: [place];  Service: Orthopedics;  Laterality: Right;   HEMORRHOID SURGERY N/A 09/05/2017   Procedure: TWO COLUMN HEMORRHOIDECTOMY, HEMROIDPEXY;  Surgeon: Leighton Ruff, MD;  Location: [facility];  Service: General;  Laterality: N/A;   KNEE ARTHROPLASTY  01/15/2011   Procedure: COMPUTER ASSISTED TOTAL KNEE ARTHROPLASTY;  Surgeon: Meredith Pel;  Location: [place];  Service: Orthopedics;  Laterality: Right;  Right total knee arthroplasty   KNEE ARTHROSCOPY Left 12/2009   NASAL SINUS SURGERY  2017   TONSILLECTOMY  age 65   TOTAL KNEE ARTHROPLASTY Left 11-27-2010    dr Vondra Aldredge   [place]   Occupational History   Occupation: court reporter-semi retired  Tobacco Use   Smoking status: Former    Packs/day: 0.50    Years: 16.00    Total pack years: 8.00    Types: Cigarettes    Quit date: 01/11/1991    Years since quitting: 31.1   Smokeless tobacco: Never  Vaping Use   Vaping Use: Never used  Substance and Sexual Activity   Alcohol use: No   Drug use: No   Sexual activity: Yes    Birth control/protection: Surgical

## 2022-03-13 NOTE — Telephone Encounter (Signed)
error 

## 2022-04-24 ENCOUNTER — Ambulatory Visit: Payer: Medicare Other | Admitting: Orthopedic Surgery

## 2022-04-30 ENCOUNTER — Other Ambulatory Visit (INDEPENDENT_AMBULATORY_CARE_PROVIDER_SITE_OTHER): Payer: Medicare Other

## 2022-04-30 ENCOUNTER — Ambulatory Visit (INDEPENDENT_AMBULATORY_CARE_PROVIDER_SITE_OTHER): Payer: Medicare Other | Admitting: Surgical

## 2022-04-30 DIAGNOSIS — M5412 Radiculopathy, cervical region: Secondary | ICD-10-CM

## 2022-04-30 DIAGNOSIS — M25512 Pain in left shoulder: Secondary | ICD-10-CM

## 2022-05-05 ENCOUNTER — Encounter: Payer: Self-pay | Admitting: Surgical

## 2022-05-05 NOTE — Progress Notes (Signed)
Follow-up Office Visit Note   Patient: Joann [name]           Date of Birth: [birth year]           MRN: [id number] Visit Date: 04/30/2022 Requested by: No referring provider defined for this encounter. PCP: Pcp, No  Subjective: Chief Complaint  Patient presents with   Left Shoulder - Follow-up    HPI: Joann [name] is a 72 y.o. female who returns to the office for follow-up visit.    Plan at last visit was: Impression is left shoulder stiffness which is most likely related to moderate arthritis. I do not think she is getting a frozen shoulder. Plan is Medrol Dosepak 6-day course and follow-up for repeat assessment in 6 weeks   Since then, patient notes continued left shoulder pain that she localizes to the anterior aspect of the left shoulder with radiation to the bicep as well as into the trapezius region.  She has difficulty with doing her hair and lifting her arm up.  She denies any scapular pain or numbness and tingling.  No burning sensation.  Pain will wake her up at night.  She has noticed a left hand tremor for the last few weeks.  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  Pain was not really improved at all with prednisone Dosepak.  Starting to notice a little bit of neck discomfort that she localizes primarily in the trap region.              ROS: All systems reviewed are negative as they relate to the chief complaint within the history of present illness.  Patient denies fevers or chills.  Assessment & Plan: Visit Diagnoses:  1. Radiculopathy, cervical region   2. Left shoulder pain, unspecified chronicity     Plan: Joann [name] is a 72 y.o. female who returns to the office for follow-up visit for left shoulder pain.  Plan from last visit was noted above in HPI.  They now return with continued left shoulder pain.  She has pretty much constant pain that is worsening over the last several months.  She seems to have more stiffness of her shoulder than her  initial evaluation on 09/13/2021.  At that time she had glenohumeral injection that only provided about 50% of relief of her shoulder pain.  She has MRI of the left shoulder demonstrating glenohumeral arthritis.  However, with her constant pain as well as the asymmetric FPL and finger abduction weakness that is noted on exam today, she may have some contribution of pain from the cervical spine.  She has cervical spine x-rays today demonstrating loss of lordosis with mild degenerative changes at C4-C5 as well as moderate degenerative changes at C5-C6.  Plan for further evaluation of referred pain from cervical spine with MRI of the C-spine.  Will also order thin cut CT scan of the left shoulder in anticipation of left reverse shoulder arthroplasty that patient is considering proceeding with.  She states that her current shoulder pain is bothering her much more than the knee pain she had years ago that prompted her to have both of her knees replaced.  Follow-up after studies to review results with Dr. August Saucer.  Follow-Up Instructions: No follow-ups on file.   Orders:  Orders Placed This Encounter  Procedures   XR Cervical Spine 2 or 3 views   MR Cervical Spine w/o contrast   CT SHOULDER LEFT WO CONTRAST   No orders  of the defined types were placed in this encounter.     Procedures: No procedures performed   Clinical Data: No additional findings.  Objective: Vital Signs: There were no vitals taken for this visit.  Physical Exam:  Constitutional: Patient appears well-developed HEENT:  Head: Normocephalic Eyes:EOM are normal Neck: Normal range of motion Cardiovascular: Normal rate Pulmonary/chest: Effort normal Neurologic: Patient is alert Skin: Skin is warm Psychiatric: Patient has normal mood and affect  Ortho Exam: Ortho exam demonstrates left shoulder with 30 degrees X rotation, 50 degrees abduction, 90 degrees forward elevation passively and actively.  This compared with the right  shoulder with 60 degrees external rotation, 110 degrees abduction, 160 degrees forward elevation.  She has weakness of interest Natus strength testing of the left shoulder rated 4/5 with 5/5 supraspinatus and subscapularis strength.  Axillary nerve is intact with deltoid firing in the left shoulder.  She does have FPL and finger abduction weakness noted to be rated 4/5 relative to the right hand which has 5/5 FPL and finger abduction strength.  Intact EPL, pronation/supination, bicep, tricep, deltoid rated 5/5 bilaterally.  She has tenderness over the bicipital groove.  No tenderness over the [facility] joint on the right shoulder but mild tenderness on the left.  Negative Spurling sign.  Negative Lhermitte sign.  Bicep tendon reflexes are 1+ bilaterally.  Specialty Comments:  No specialty comments available.  Imaging: No results found.   PMFS History: Patient Active Problem List   Diagnosis Date Noted   Right upper quadrant pain 08/21/2017   Nausea without vomiting 08/21/2017   Hyperlipidemia 01/01/2017   Pre-diabetes 01/01/2017   Shortness of breath 01/01/2017   Palpitations 12/22/2016   Chest pain 12/19/2016   Hemorrhoids 12/19/2016   Past Medical History:  Diagnosis Date   Chronic diarrhea    due to IBS   Dyslipidemia    GERD (gastroesophageal reflux disease)    Hiatal hernia    History of palpitations    History of skin cancer    nose   IBS (irritable bowel syndrome)    OA (osteoarthritis)    knees and lower back   OAB (overactive bladder)    Prolapsed internal hemorrhoids, grade 3    Wears contact lenses    Wears dentures    UPPER    Family History  Problem Relation Age of Onset   Alzheimer's disease Mother    Colon cancer Neg Hx    Stomach cancer Neg Hx    Rectal cancer Neg Hx    Esophageal cancer Neg Hx    Liver cancer Neg Hx     Past Surgical History:  Procedure Laterality Date   ABDOMINAL HYSTERECTOMY  1996    W/ BSO   BREAST ENHANCEMENT SURGERY  1985    BUNIONECTOMY WITH HAMMERTOE RECONSTRUCTION Right 07-08-2014    @ [facility]   1st toe bunionectomy;  2nd toe hammertoe correction   CARDIOVASCULAR STRESS TEST  12-20-2016    [facility] , care everywhere in epic   norma nuclear perfusion study w/ no ischemia/  normal LV function and wall motion , ef 71%   CATARACT EXTRACTION, BILATERAL     COLONOSCOPY  07/2011   [facility]    FOOT SURGERY     2 bone spurs removed   FOOT SURGERY Left 2022   arthroscopy for arthritis   FOREIGN BODY REMOVAL Right 02/27/2016   Procedure: REMOVAL FOREIGN BODY EXTREMITY;  Surgeon: Cammy CopaScott Gregory Dean, MD;  Location: [facility];  Service:  Orthopedics;  Laterality: Right;   HEMORRHOID SURGERY N/A 09/05/2017   Procedure: TWO COLUMN HEMORRHOIDECTOMY, HEMROIDPEXY;  Surgeon: Romie Levee, MD;  Location: [facility];  Service: General;  Laterality: N/A;   KNEE ARTHROPLASTY  01/15/2011   Procedure: COMPUTER ASSISTED TOTAL KNEE ARTHROPLASTY;  Surgeon: Cammy Copa;  Location: [facility];  Service: Orthopedics;  Laterality: Right;  Right total knee arthroplasty   KNEE ARTHROSCOPY Left 12/2009   NASAL SINUS SURGERY  2017   TONSILLECTOMY  age 61   TOTAL KNEE ARTHROPLASTY Left 11-27-2010    dr dean   [facility]   Social History   Occupational History   Occupation: court reporter-semi retired  Tobacco Use   Smoking status: Former    Packs/day: 0.50    Years: 16.00    Additional pack years: 0.00    Total pack years: 8.00    Types: Cigarettes    Quit date: 01/11/1991    Years since quitting: 31.3   Smokeless tobacco: Never  Vaping Use   Vaping Use: Never used  Substance and Sexual Activity   Alcohol use: No   Drug use: No   Sexual activity: Yes    Birth control/protection: Surgical

## 2022-05-10 ENCOUNTER — Ambulatory Visit
Admission: RE | Admit: 2022-05-10 | Discharge: 2022-05-10 | Disposition: A | Discharge status: Home / Self Care | Payer: Medicare Other | Source: Ambulatory Visit | Attending: Surgical | Admitting: Surgical

## 2022-05-10 ENCOUNTER — Ambulatory Visit
Admission: RE | Admit: 2022-05-10 | Discharge: 2022-05-10 | Disposition: A | Discharge status: Home / Self Care | Payer: Medicare Other | Source: Ambulatory Visit | Attending: Orthopedic Surgery | Admitting: Orthopedic Surgery

## 2022-05-10 DIAGNOSIS — M25512 Pain in left shoulder: Secondary | ICD-10-CM

## 2022-05-10 DIAGNOSIS — M5412 Radiculopathy, cervical region: Secondary | ICD-10-CM

## 2022-05-17 ENCOUNTER — Encounter: Payer: Self-pay | Admitting: Surgical

## 2022-05-17 ENCOUNTER — Ambulatory Visit (INDEPENDENT_AMBULATORY_CARE_PROVIDER_SITE_OTHER): Payer: Medicare Other | Admitting: Surgical

## 2022-05-17 DIAGNOSIS — M19012 Primary osteoarthritis, left shoulder: Secondary | ICD-10-CM | POA: Diagnosis not present

## 2022-05-17 DIAGNOSIS — M5412 Radiculopathy, cervical region: Secondary | ICD-10-CM | POA: Diagnosis not present

## 2022-05-17 NOTE — Progress Notes (Signed)
Office Visit Note   Patient: Joann [name]           Date of Birth: 1950-07-02           MRN: [id number] Visit Date: 05/17/2022 Requested by: No referring provider defined for this encounter. PCP: Pcp, No  Subjective: Chief Complaint  Patient presents with   Neck - Pain    HPI: Joann [name] is a 72 y.o. female who presents to the office for MRI review. Patient denies any changes in symptoms.  Continues to complain mainly of left shoulder pain that she localizes to the lateral aspect of the shoulder with some radiation to the mid humeral region.  Not really having a lot of numbness or tingling or burning sensation in this region.  Not a lot of focal neck pain but does have a little bit of trapezial pain.  No scapular pain.  Mostly bothers her with bringing her arm across her body or behind her back.  MRI results revealed: MR Cervical Spine w/o contrast  Result Date: 05/13/2022 CLINICAL DATA:  Left shoulder and neck pain.  Left arm pain. EXAM: MRI CERVICAL SPINE WITHOUT CONTRAST TECHNIQUE: Multiplanar, multisequence MR imaging of the cervical spine was performed. No intravenous contrast was administered. COMPARISON:  None Available. FINDINGS: Alignment: Physiologic. Vertebrae: No acute fracture, evidence of discitis, or aggressive bone lesion. Cord: Normal signal and morphology. Posterior Fossa, vertebral arteries, paraspinal tissues: Posterior fossa demonstrates no focal abnormality. Vertebral artery flow voids are maintained. Paraspinal soft tissues are unremarkable. Disc levels: Discs: Degenerative disease with mild disc height loss at C5-6 and C6-7. C2-3: No disc protrusion. Moderate left facet arthropathy. Mild left foraminal stenosis. No right foraminal stenosis. No spinal stenosis. C3-4: No disc protrusion. Severe left facet arthropathy. Moderate-severe left foraminal stenosis. No right foraminal stenosis. No spinal stenosis. C4-5: Moderate left facet arthropathy. Mild left foraminal  stenosis. No right foraminal stenosis. No spinal stenosis. C5-6: Mild broad-based disc bulge. Mild bilateral facet arthropathy. Mild-moderate right foraminal stenosis. No left foraminal stenosis. No spinal stenosis. C6-7: Mild broad-based disc bulge. Bilateral uncovertebral degenerative changes. Mild bilateral foraminal stenosis. C7-T1: No significant disc bulge. No neural foraminal stenosis. No central canal stenosis. IMPRESSION: 1. Diffuse cervical spine spondylosis as described above, more severe on the left. 2. No acute osseous injury of the cervical spine. Electronically Signed   By: Elige Ko M.D.   On: 05/13/2022 10:25                 ROS: All systems reviewed are negative as they relate to the chief complaint within the history of present illness.  Patient denies fevers or chills.  Assessment & Plan: Visit Diagnoses:  1. Radiculopathy, cervical region   2. Arthritis of left shoulder region     Plan: Joann [name] is a 72 y.o. female who presents to the office for review of MRI of cervical spine.  MRI demonstrates moderate to severe foraminal stenosis on the left at C3-C4.  May have some contribution of her shoulder pain coming from cervical spine pathology.  Will see how much this is responsible with diagnostic injection with Dr. Alvester Morin.  We also will post her for left shoulder reverse shoulder arthroplasty to be done several weeks after her cervical spine injection so that if this injection does not help significantly, we will proceed with shoulder replacement.  If she does have excellent relief from neck injection then we can cancel the plan for surgery.  Patient agreed with this  plan.  We discussed the risks and benefits of the procedure as well as the recovery timeframe after surgery and what to expect.  We discussed the risks and benefits including but not limited to the risk of nerve/blood vessel damage, shoulder stiffness, shoulder instability, fracture at the time of surgery, need for  revision surgery in the future, prosthetic joint infection, medical complication from surgery.  We also discussed a lifting restriction after surgery long-term of 20 to 30 pounds.  Patient would like to proceed with surgery regardless.  Will post her and follow-up after procedure.  Follow-Up Instructions: No follow-ups on file.   Orders:  Orders Placed This Encounter  Procedures   Ambulatory referral to Physical Medicine Rehab   No orders of the defined types were placed in this encounter.     Procedures: No procedures performed   Clinical Data: No additional findings.  Objective: Vital Signs: There were no vitals taken for this visit.  Physical Exam:  Constitutional: Patient appears well-developed HEENT:  Head: Normocephalic Eyes:EOM are normal Neck: Normal range of motion Cardiovascular: Normal rate Pulmonary/chest: Effort normal Neurologic: Patient is alert Skin: Skin is warm Psychiatric: Patient has normal mood and affect  Ortho Exam: Left shoulder with 2+ radial pulse of the left upper extremity.  Intact EPL, abduction of the fingers, grip strength.  Axillary nerve is intact with deltoid firing.  She has significant limitation to passive motion of the left shoulder with about 20 degrees external rotation, 60 degrees abduction, 90 degrees forward elevation passively.  Skin looks normal with no abnormalities or cellulitis.  Specialty Comments:  No specialty comments available.  Imaging: No results found.   PMFS History: Patient Active Problem List   Diagnosis Date Noted   Right upper quadrant pain 08/21/2017   Nausea without vomiting 08/21/2017   Hyperlipidemia 01/01/2017   Pre-diabetes 01/01/2017   Shortness of breath 01/01/2017   Palpitations 12/22/2016   Chest pain 12/19/2016   Hemorrhoids 12/19/2016   Past Medical History:  Diagnosis Date   Chronic diarrhea    due to IBS   Dyslipidemia    GERD (gastroesophageal reflux disease)    Hiatal hernia     History of palpitations    History of skin cancer    nose   IBS (irritable bowel syndrome)    OA (osteoarthritis)    knees and lower back   OAB (overactive bladder)    Prolapsed internal hemorrhoids, grade 3    Wears contact lenses    Wears dentures    UPPER    Family History  Problem Relation Age of Onset   Alzheimer's disease Mother    Colon cancer Neg Hx    Stomach cancer Neg Hx    Rectal cancer Neg Hx    Esophageal cancer Neg Hx    Liver cancer Neg Hx     Past Surgical History:  Procedure Laterality Date   ABDOMINAL HYSTERECTOMY  1996    W/ BSO   BREAST ENHANCEMENT SURGERY  1985   BUNIONECTOMY WITH HAMMERTOE RECONSTRUCTION Right 07-08-2014    @ [facility]   1st toe bunionectomy;  2nd toe hammertoe correction   CARDIOVASCULAR STRESS TEST  12-20-2016    [facility] , care everywhere in epic   norma nuclear perfusion study w/ no ischemia/  normal LV function and wall motion , ef 71%   CATARACT EXTRACTION, BILATERAL     COLONOSCOPY  07/2011   [facility]    FOOT SURGERY     2  bone spurs removed   FOOT SURGERY Left 2022   arthroscopy for arthritis   FOREIGN BODY REMOVAL Right 02/27/2016   Procedure: REMOVAL FOREIGN BODY EXTREMITY;  Surgeon: Cammy Copa, MD;  Location: [facility] OR;  Service: Orthopedics;  Laterality: Right;   HEMORRHOID SURGERY N/A 09/05/2017   Procedure: TWO COLUMN HEMORRHOIDECTOMY, HEMROIDPEXY;  Surgeon: Romie Levee, MD;  Location: [facility];  Service: General;  Laterality: N/A;   KNEE ARTHROPLASTY  01/15/2011   Procedure: COMPUTER ASSISTED TOTAL KNEE ARTHROPLASTY;  Surgeon: Cammy Copa;  Location: [facility] OR;  Service: Orthopedics;  Laterality: Right;  Right total knee arthroplasty   KNEE ARTHROSCOPY Left 12/2009   NASAL SINUS SURGERY  2017   TONSILLECTOMY  age 66   TOTAL KNEE ARTHROPLASTY Left 11-27-2010    dr dean   [facility]   Social History   Occupational History   Occupation: court reporter-semi retired   Tobacco Use   Smoking status: Former    Packs/day: 0.50    Years: 16.00    Additional pack years: 0.00    Total pack years: 8.00    Types: Cigarettes    Quit date: 01/11/1991    Years since quitting: 31.3   Smokeless tobacco: Never  Vaping Use   Vaping Use: Never used  Substance and Sexual Activity   Alcohol use: No   Drug use: No   Sexual activity: Yes    Birth control/protection: Surgical

## 2022-05-24 ENCOUNTER — Ambulatory Visit (INDEPENDENT_AMBULATORY_CARE_PROVIDER_SITE_OTHER): Payer: Medicare Other | Admitting: Physical Medicine and Rehabilitation

## 2022-05-24 ENCOUNTER — Other Ambulatory Visit: Payer: Self-pay

## 2022-05-24 VITALS — BP 119/80 | HR 80

## 2022-05-24 DIAGNOSIS — M5412 Radiculopathy, cervical region: Secondary | ICD-10-CM

## 2022-05-24 MED ORDER — METHYLPREDNISOLONE ACETATE 80 MG/ML IJ SUSP
80.0000 mg | Freq: Once | INTRAMUSCULAR | Status: AC
Start: 2022-05-24 — End: 2022-05-24
  Administered 2022-05-24: 80 mg

## 2022-05-24 NOTE — Progress Notes (Unsigned)
Functional Pain Scale - descriptive words and definitions  Moderate (4)   Constantly aware of pain, can complete ADLs with modification/sleep marginally affected at times/passive distraction is of no use, but active distraction gives some relief. Moderate range order  Average Pain  varies   +Driver, -BT, -Dye Allergies.  Neck pain on left that radiates into left shoulder

## 2022-05-24 NOTE — Patient Instructions (Signed)

## 2022-05-29 NOTE — Procedures (Signed)
Cervical Epidural Steroid Injection - Interlaminar Approach with Fluoroscopic Guidance  Patient: Joann [name]      Date of Birth: [birth year] MRN: [id number] PCP: Pcp, No      Visit Date: 05/24/2022   Universal Protocol:    Date/Time: 05/01/245:06 AM  Consent Given By: the patient  Position: PRONE  Additional Comments: Vital signs were monitored before and after the procedure. Patient was prepped and draped in the usual sterile fashion. The correct patient, procedure, and site was verified.   Injection Procedure Details:   Procedure diagnoses: Cervical radiculopathy [M54.12]    Meds Administered:  Meds ordered this encounter  Medications   methylPREDNISolone acetate (DEPO-MEDROL) injection 80 mg     Laterality: Left  Location/Site: C7-T1  Needle: 3.5 in., 20 ga. Tuohy  Needle Placement: Paramedian epidural space  Findings:  -Comments: Excellent flow of contrast into the epidural space.  Procedure Details: Using a paramedian approach from the side mentioned above, the region overlying the inferior lamina was localized under fluoroscopic visualization and the soft tissues overlying this structure were infiltrated with 4 ml. of 1% Lidocaine without Epinephrine. A # 20 gauge, Tuohy needle was inserted into the epidural space using a paramedian approach.  The epidural space was localized using loss of resistance along with contralateral oblique bi-planar fluoroscopic views.  After negative aspirate for air, blood, and CSF, a 2 ml. volume of Isovue-250 was injected into the epidural space and the flow of contrast was observed. Radiographs were obtained for documentation purposes.   The injectate was administered into the level noted above.  Additional Comments:  The patient tolerated the procedure well Dressing: 2 x 2 sterile gauze and Band-Aid    Post-procedure details: Patient was observed during the procedure. Post-procedure instructions were reviewed.  Patient  left the clinic in stable condition.

## 2022-05-29 NOTE — Progress Notes (Signed)
Joann [name] - 72 y.o. female MRN [id number]  Date of birth: Jun 14, 1950  Office Visit Note: Visit Date: 05/24/2022 PCP: Pcp, No Referred by: Julieanne Cotton, PA-C  Subjective: Chief Complaint  Patient presents with   Neck - Pain   HPI:  Joann [name] is a 72 y.o. female who comes in today at the request of Joann Cai, PA-C for planned Left C7-T1 Cervical Interlaminar epidural steroid injection with fluoroscopic guidance.  The patient has failed conservative care including home exercise, medications, time and activity modification.  This injection will be diagnostic and hopefully therapeutic.  Please see requesting physician notes for further details and justification.   ROS Otherwise per HPI.  Assessment & Plan: Visit Diagnoses:    ICD-10-CM   1. Cervical radiculopathy  M54.12 XR C-ARM NO REPORT    Epidural Steroid injection    methylPREDNISolone acetate (DEPO-MEDROL) injection 80 mg      Plan: No additional findings.   Meds & Orders:  Meds ordered this encounter  Medications   methylPREDNISolone acetate (DEPO-MEDROL) injection 80 mg    Orders Placed This Encounter  Procedures   XR C-ARM NO REPORT   Epidural Steroid injection    Follow-up: Return for visit to requesting provider as needed.   Procedures: No procedures performed  Cervical Epidural Steroid Injection - Interlaminar Approach with Fluoroscopic Guidance  Patient: Joann [name]      Date of Birth: 04-11-1950 MRN: [id number] PCP: Pcp, No      Visit Date: 05/24/2022   Universal Protocol:    Date/Time: 05/01/245:06 AM  Consent Given By: the patient  Position: PRONE  Additional Comments: Vital signs were monitored before and after the procedure. Patient was prepped and draped in the usual sterile fashion. The correct patient, procedure, and site was verified.   Injection Procedure Details:   Procedure diagnoses: Cervical radiculopathy [M54.12]    Meds Administered:  Meds ordered this  encounter  Medications   methylPREDNISolone acetate (DEPO-MEDROL) injection 80 mg     Laterality: Left  Location/Site: C7-T1  Needle: 3.5 in., 20 ga. Tuohy  Needle Placement: Paramedian epidural space  Findings:  -Comments: Excellent flow of contrast into the epidural space.  Procedure Details: Using a paramedian approach from the side mentioned above, the region overlying the inferior lamina was localized under fluoroscopic visualization and the soft tissues overlying this structure were infiltrated with 4 ml. of 1% Lidocaine without Epinephrine. A # 20 gauge, Tuohy needle was inserted into the epidural space using a paramedian approach.  The epidural space was localized using loss of resistance along with contralateral oblique bi-planar fluoroscopic views.  After negative aspirate for air, blood, and CSF, a 2 ml. volume of Isovue-250 was injected into the epidural space and the flow of contrast was observed. Radiographs were obtained for documentation purposes.   The injectate was administered into the level noted above.  Additional Comments:  The patient tolerated the procedure well Dressing: 2 x 2 sterile gauze and Band-Aid    Post-procedure details: Patient was observed during the procedure. Post-procedure instructions were reviewed.  Patient left the clinic in stable condition.   Clinical History: CLINICAL DATA:  Left shoulder and neck pain.  Left arm pain.   EXAM: MRI CERVICAL SPINE WITHOUT CONTRAST   TECHNIQUE: Multiplanar, multisequence MR imaging of the cervical spine was performed. No intravenous contrast was administered.   COMPARISON:  None Available.   FINDINGS: Alignment: Physiologic.   Vertebrae: No acute fracture, evidence of discitis, or aggressive  bone lesion.   Cord: Normal signal and morphology.   Posterior Fossa, vertebral arteries, paraspinal tissues: Posterior fossa demonstrates no focal abnormality. Vertebral artery flow voids are  maintained. Paraspinal soft tissues are unremarkable.   Disc levels:   Discs: Degenerative disease with mild disc height loss at C5-6 and C6-7.   C2-3: No disc protrusion. Moderate left facet arthropathy. Mild left foraminal stenosis. No right foraminal stenosis. No spinal stenosis.   C3-4: No disc protrusion. Severe left facet arthropathy. Moderate-severe left foraminal stenosis. No right foraminal stenosis. No spinal stenosis.   C4-5: Moderate left facet arthropathy. Mild left foraminal stenosis. No right foraminal stenosis. No spinal stenosis.   C5-6: Mild broad-based disc bulge. Mild bilateral facet arthropathy. Mild-moderate right foraminal stenosis. No left foraminal stenosis. No spinal stenosis.   C6-7: Mild broad-based disc bulge. Bilateral uncovertebral degenerative changes. Mild bilateral foraminal stenosis.   C7-T1: No significant disc bulge. No neural foraminal stenosis. No central canal stenosis.   IMPRESSION: 1. Diffuse cervical spine spondylosis as described above, more severe on the left. 2. No acute osseous injury of the cervical spine.     Electronically Signed   By: Elige Ko M.D.   On: 05/13/2022 10:25     Objective:  VS:  HT:    WT:   BMI:     BP:119/80  HR:80bpm  TEMP: ( )  RESP:  Physical Exam Vitals and nursing note reviewed.  Constitutional:      General: She is not in acute distress.    Appearance: Normal appearance. She is not ill-appearing.  HENT:     Head: Normocephalic and atraumatic.     Right Ear: External ear normal.     Left Ear: External ear normal.  Eyes:     Extraocular Movements: Extraocular movements intact.  Cardiovascular:     Rate and Rhythm: Normal rate.     Pulses: Normal pulses.  Musculoskeletal:     Cervical back: Tenderness present. No rigidity.     Right lower leg: No edema.     Left lower leg: No edema.     Comments: Patient has good strength in the upper extremities including 5 out of 5 strength in  wrist extension long finger flexion and APB.  There is no atrophy of the hands intrinsically.  There is a negative Hoffmann's test.   Lymphadenopathy:     Cervical: No cervical adenopathy.  Skin:    Findings: No erythema, lesion or rash.  Neurological:     General: No focal deficit present.     Mental Status: She is alert and oriented to person, place, and time.     Sensory: No sensory deficit.     Motor: No weakness or abnormal muscle tone.     Coordination: Coordination normal.  Psychiatric:        Mood and Affect: Mood normal.        Behavior: Behavior normal.      Imaging: No results found.

## 2022-06-04 ENCOUNTER — Encounter: Payer: Medicare Other | Admitting: Physical Medicine and Rehabilitation

## 2022-06-05 ENCOUNTER — Telehealth: Payer: Self-pay | Admitting: Orthopedic Surgery

## 2022-06-05 NOTE — Telephone Encounter (Signed)
Patient is scheduled 07-18-22 7:30am  at [facility] for left reverse shoulder arthroplasty, biceps tenodesis with Dr. Dorene Grebe.  Post op is set for 08-02-22 @ 1pm with Karenann Cai, PA

## 2022-06-19 NOTE — Progress Notes (Signed)
I called and talked to [name].  Basically she did not get hardly any relief from the cervical spine injection.  She had a day or so of relief from the glenohumeral joint injection.  When she wakes up in the morning and starts moving her shoulder her shoulder becomes painful.  Not really having any symptoms below the elbow.  MRI scan shows nothing really discretely arthroscopically treatable.  I think the arthritis is likely a little bit worse than it is appearing on the MRI scan last year and actually on the CT scan I would call at moderate arthritis in the glenohumeral joint.  Plan at this time is shoulder replacement.  All questions answered

## 2022-07-01 ENCOUNTER — Other Ambulatory Visit: Payer: Self-pay

## 2022-07-10 NOTE — Pre-Procedure Instructions (Signed)
Surgical Instructions    Your procedure is scheduled on July 18, 2022.  Report to [facility] Main Entrance "A" at 5:30 A.M., then check in with the Admitting office.  Call this number if you have problems the morning of surgery:  [phone]  If you have any questions prior to your surgery date call [phone]: Open Monday-Friday 8am-4pm If you experience any cold or flu symptoms such as cough, fever, chills, shortness of breath, etc. between now and your scheduled surgery, please notify us at the above number.     Remember:  Do not eat after midnight the night before your surgery  You may drink clear liquids until 4:30 AM the morning of your surgery.   Clear liquids allowed are: Water, Non-Citrus Juices (without pulp), Carbonated Beverages, Clear Tea, Black Coffee Only (NO MILK, CREAM OR POWDERED CREAMER of any kind), and Gatorade.  Patient Instructions  The night before surgery:  No food after midnight. ONLY clear liquids after midnight  The day of surgery (if you do NOT have diabetes):  Drink ONE (1) Pre-Surgery Clear Ensure by 4:30 AM the morning of surgery. Drink in one sitting. Do not sip.  This drink was given to you during your hospital  pre-op appointment visit.  Nothing else to drink after completing the  Pre-Surgery Clear Ensure.         If you have questions, please contact your surgeon's office.     Take these medicines the morning of surgery with A SIP OF WATER:  Vibegron Leslye Peer)    May take these medicines IF NEEDED:  acetaminophen (TYLENOL)   hyoscyamine (LEVSIN SL)    As of today, STOP taking any Aspirin (unless otherwise instructed by your surgeon) Aleve, Naproxen, Ibuprofen, Motrin, Advil, Goody's, BC's, all herbal medications, fish oil, and all vitamins.                     Do NOT Smoke (Tobacco/Vaping) for 24 hours prior to your procedure.  If you use a CPAP at night, you may bring your mask/headgear for your overnight stay.   Contacts,  glasses, piercing's, hearing aid's, dentures or partials may not be worn into surgery, please bring cases for these belongings.    For patients admitted to the hospital, discharge time will be determined by your treatment team.   Patients discharged the day of surgery will not be allowed to drive home, and someone needs to stay with them for 24 hours.  SURGICAL WAITING ROOM VISITATION Patients having surgery or a procedure may have no more than 2 support people in the waiting area - these visitors may rotate.   Children under the age of 61 must have an adult with them who is not the patient. If the patient needs to stay at the hospital during part of their recovery, the visitor guidelines for inpatient rooms apply. Pre-op nurse will coordinate an appropriate time for 1 support person to accompany patient in pre-op.  This support person may not rotate.   Please refer to the [facility] website for the visitor guidelines for Inpatients (after your surgery is over and you are in a regular room).   If you received a COVID test during your pre-op visit  it is requested that you wear a mask when out in public, stay away from anyone that may not be feeling well and notify your surgeon if you develop symptoms. If you have been in contact with anyone that has tested positive in the last 10  days please notify you surgeon.    Pre-operative 5 CHG Bath Instructions   You can play a key role in reducing the risk of infection after surgery. Your skin needs to be as free of germs as possible. You can reduce the number of germs on your skin by washing with CHG (chlorhexidine gluconate) soap before surgery. CHG is an antiseptic soap that kills germs and continues to kill germs even after washing.   DO NOT use if you have an allergy to chlorhexidine/CHG or antibacterial soaps. If your skin becomes reddened or irritated, stop using the CHG and notify one of our RNs at [phone].   Please shower with the CHG  soap starting 4 days before surgery using the following schedule:     Please keep in mind the following:  DO NOT shave, including legs and underarms, starting the day of your first shower.   You may shave your face at any point before/day of surgery.  Place clean sheets on your bed the day you start using CHG soap. Use a clean washcloth (not used since being washed) for each shower. DO NOT sleep with pets once you start using the CHG.   CHG Shower Instructions:  If you choose to wash your hair and private area, wash first with your normal shampoo/soap.  After you use shampoo/soap, rinse your hair and body thoroughly to remove shampoo/soap residue.  Turn the water OFF and apply about 3 tablespoons (45 ml) of CHG soap to a CLEAN washcloth.  Apply CHG soap ONLY FROM YOUR NECK DOWN TO YOUR TOES (washing for 3-5 minutes)  DO NOT use CHG soap on face, private areas, open wounds, or sores.  Pay special attention to the area where your surgery is being performed.  If you are having back surgery, having someone wash your back for you may be helpful. Wait 2 minutes after CHG soap is applied, then you may rinse off the CHG soap.  Pat dry with a clean towel  Put on clean clothes/pajamas   If you choose to wear lotion, please use ONLY the CHG-compatible lotions on the back of this paper.     Additional instructions for the day of surgery: DO NOT APPLY any lotions, deodorants, cologne, or perfumes.   Do not wear jewelry or makeup Do not wear nail polish, gel polish, artificial nails, or any other type of covering on natural nails (fingers and toes) Do not bring valuables to the hospital. [facility] is not responsible for any belongings or valuables. Put on clean/comfortable clothes.  Brush your teeth.  Ask your nurse before applying any prescription medications to the skin.      CHG Compatible Lotions   Aveeno Moisturizing lotion  Cetaphil Moisturizing Cream  Cetaphil Moisturizing  Lotion  Clairol Herbal Essence Moisturizing Lotion, Dry Skin  Clairol Herbal Essence Moisturizing Lotion, Extra Dry Skin  Clairol Herbal Essence Moisturizing Lotion, Normal Skin  Curel Age Defying Therapeutic Moisturizing Lotion with Alpha Hydroxy  Curel Extreme Care Body Lotion  Curel Soothing Hands Moisturizing Hand Lotion  Curel Therapeutic Moisturizing Cream, Fragrance-Free  Curel Therapeutic Moisturizing Lotion, Fragrance-Free  Curel Therapeutic Moisturizing Lotion, Original Formula  Eucerin Daily Replenishing Lotion  Eucerin Dry Skin Therapy Plus Alpha Hydroxy Crme  Eucerin Dry Skin Therapy Plus Alpha Hydroxy Lotion  Eucerin Original Crme  Eucerin Original Lotion  Eucerin Plus Crme Eucerin Plus Lotion  Eucerin TriLipid Replenishing Lotion  Keri Anti-Bacterial Hand Lotion  Keri Deep Conditioning Original Lotion Dry Skin Formula Softly Scented  Keri Deep Conditioning Original Lotion, Fragrance Free Sensitive Skin Formula  Keri Lotion Fast Absorbing Fragrance Free Sensitive Skin Formula  Keri Lotion Fast Absorbing Softly Scented Dry Skin Formula  Keri Original Lotion  Keri Skin Renewal Lotion Keri Silky Smooth Lotion  Keri Silky Smooth Sensitive Skin Lotion  Nivea Body Creamy Conditioning Oil  Nivea Body Extra Enriched Lotion  Nivea Body Original Lotion  Nivea Body Sheer Moisturizing Lotion Nivea Crme  Nivea Skin Firming Lotion  NutraDerm 30 Skin Lotion  NutraDerm Skin Lotion  NutraDerm Therapeutic Skin Cream  NutraDerm Therapeutic Skin Lotion  ProShield Protective Hand Cream  Provon moisturizing lotion   Please read over the following fact sheets that you were given.

## 2022-07-11 ENCOUNTER — Other Ambulatory Visit: Payer: Self-pay

## 2022-07-11 ENCOUNTER — Encounter (HOSPITAL_COMMUNITY)
Admission: RE | Admit: 2022-07-11 | Discharge: 2022-07-11 | Disposition: A | Discharge status: Home / Self Care | Payer: Medicare Other | Source: Ambulatory Visit | Attending: Orthopedic Surgery | Admitting: Orthopedic Surgery

## 2022-07-11 ENCOUNTER — Encounter (HOSPITAL_COMMUNITY): Payer: Self-pay

## 2022-07-11 VITALS — BP 129/75 | HR 73 | Temp 98.4°F | Resp 17 | Ht 63.0 in | Wt 183.0 lb

## 2022-07-11 DIAGNOSIS — I2510 Atherosclerotic heart disease of native coronary artery without angina pectoris: Secondary | ICD-10-CM | POA: Insufficient documentation

## 2022-07-11 DIAGNOSIS — K449 Diaphragmatic hernia without obstruction or gangrene: Secondary | ICD-10-CM | POA: Insufficient documentation

## 2022-07-11 DIAGNOSIS — Z01818 Encounter for other preprocedural examination: Secondary | ICD-10-CM | POA: Insufficient documentation

## 2022-07-11 DIAGNOSIS — K219 Gastro-esophageal reflux disease without esophagitis: Secondary | ICD-10-CM | POA: Insufficient documentation

## 2022-07-11 DIAGNOSIS — R002 Palpitations: Secondary | ICD-10-CM | POA: Diagnosis not present

## 2022-07-11 HISTORY — DX: Cardiac arrhythmia, unspecified: I49.9

## 2022-07-11 LAB — URINALYSIS, W/ REFLEX TO CULTURE (INFECTION SUSPECTED)
Bacteria, UA: NONE SEEN
Bilirubin Urine: NEGATIVE
Glucose, UA: NEGATIVE mg/dL
Hgb urine dipstick: NEGATIVE
Ketones, ur: NEGATIVE mg/dL
Leukocytes,Ua: NEGATIVE
Nitrite: NEGATIVE
Protein, ur: NEGATIVE mg/dL
Specific Gravity, Urine: 1.015 (ref 1.005–1.030)
pH: 5 (ref 5.0–8.0)

## 2022-07-11 LAB — SURGICAL PCR SCREEN
MRSA, PCR: NEGATIVE
Staphylococcus aureus: POSITIVE — AB

## 2022-07-11 LAB — BASIC METABOLIC PANEL
Anion gap: 9 (ref 5–15)
BUN: 11 mg/dL (ref 8–23)
CO2: 27 mmol/L (ref 22–32)
Calcium: 9.9 mg/dL (ref 8.9–10.3)
Chloride: 102 mmol/L (ref 98–111)
Creatinine, Ser: 0.66 mg/dL (ref 0.44–1.00)
GFR, Estimated: >60 mL/min (ref >60)
Glucose, Bld: 109 mg/dL — ABNORMAL HIGH (ref 70–99)
Potassium: 4 mmol/L (ref 3.5–5.1)
Sodium: 138 mmol/L (ref 135–145)

## 2022-07-11 LAB — CBC
HCT: 43.3 % (ref 36.0–46.0)
Hemoglobin: 14.1 g/dL (ref 12.0–15.0)
MCH: 29 pg (ref 26.0–34.0)
MCHC: 32.6 g/dL (ref 30.0–36.0)
MCV: 88.9 fL (ref 80.0–100.0)
Platelets: 273 10*3/uL (ref 150–400)
RBC: 4.87 MIL/uL (ref 3.87–5.11)
RDW: 12.8 % (ref 11.5–15.5)
WBC: 7.3 10*3/uL (ref 4.0–10.5)
nRBC: 0 % (ref 0.0–0.2)

## 2022-07-11 NOTE — Progress Notes (Signed)
PCP - Pt goes to an Urgent Care with [facility] in [place] Cardiologist - Saw Dr. Jens Som in 2017 for SOB (work-up negative). She saw Dr. Eden Emms in 2023 for Palpitations (Holter monitor and echo normal) PRN follow up.  PPM/ICD - Denies Device Orders - n/a Rep Notified - n/a  Chest x-ray - Denies EKG - 07/11/2022 Stress Test - 12/20/2016 ECHO - 03/09/2021 Cardiac Cath - Denies  Sleep Study - Denies CPAP - n/a  No DM  Last dose of GLP1 agonist- n/a GLP1 instructions: n/a  Blood Thinner Instructions: n/a Aspirin Instructions: n/a  ERAS Protcol - Clear liquids until 0430 morning of surgery PRE-SURGERY Ensure or G2- Ensure given to pt with instructions  COVID TEST- n/a   Anesthesia review: Yes. Recent cardiology visit for palpitations and associated testing. No palpitation issues currently.   Patient denies shortness of breath, fever, cough and chest pain at PAT appointment. Pt denies any respiratory illness/infection in the last two months.   All instructions explained to the patient, with a verbal understanding of the material. Patient agrees to go over the instructions while at home for a better understanding. Patient also instructed to self quarantine after being tested for COVID-19. The opportunity to ask questions was provided.

## 2022-07-12 NOTE — Progress Notes (Signed)
Anesthesia Chart Review:  72 year old female with pertinent history including hiatal hernia, GERD, palpitations.  Patient had prior cardiology evaluation by Dr. Jens Som in 2017 for DOE.  Echo and ETT were normal and her symptoms were felt likely due to deconditioning.  More recently she was seen by Dr. Eden Emms on 02/20/2021 with complaint of palpitations.  Discussed that they may be related to prescription medications for IBS including Apriso.  Echo showed EF 60 to 65%, normal valves.  14-day event monitor showed underlying rhythm to be sinus with very short bursts of SVT.  She was prescribed Inderal 10 mg to use as needed.  Preop labs reviewed, unremarkable.  EKG 07/11/22: NSR. Rate 73.  Event monitor 03/09/2021: Patient had a min HR of 62 bpm, max HR of 218 bpm, and avg HR of 87 bpm. Predominant underlying rhythm was Sinus Rhythm. 15 Supraventricular Tachycardia runs occurred, the run with the fastest interval lasting 5 beats with a max rate of 218 bpm, the longest lasting 11 beats with an avg rate of 165 bpm. Isolated SVEs were rare (<1.0%), SVE Couplets were rare (<1.0%), and SVE Triplets were rare (<1.0%). Isolated VEs were rare (<1.0%), VE Couplets were rare (<1.0%), and no VE Triplets were present.   TTE 03/09/2021:  1. Left ventricular ejection fraction, by estimation, is 60 to 65%. The  left ventricle has normal function. The left ventricle has no regional  wall motion abnormalities. Left ventricular diastolic parameters were  normal.   2. Right ventricular systolic function is normal. The right ventricular  size is normal. Tricuspid regurgitation signal is inadequate for assessing  PA pressure.   3. The mitral valve is normal in structure. Trivial mitral valve  regurgitation. No evidence of mitral stenosis.   4. The aortic valve was not well visualized. Aortic valve regurgitation  is not visualized. No aortic stenosis is present.   5. The inferior vena cava is normal in size with <50%  respiratory  variability, suggesting right atrial pressure of 8 mmHg.   Exercise nuclear stress test 12/20/2016 (care everywhere): IMPRESSION:  1. Reduced exercise tolerance.  2. Electrically probably negative exercise treadmill.  Mild breathing restriction/tightness with exercise.  3. Normal and good quality SPECT Cardiolite study without convincing evidence for infarction or ischemia. This is a low risk perfusion result..  4. Preserved left ventricular function. Ejection fraction was quantitated at 71% without wall motion abnormalities.    Zannie Cove [facility] Short Stay Center/Anesthesiology Phone [phone] 07/12/2022 9:44 AM

## 2022-07-12 NOTE — Anesthesia Preprocedure Evaluation (Signed)
Anesthesia Evaluation  Patient identified by MRN, date of birth, ID band Patient awake    Reviewed: Allergy & Precautions, NPO status , Patient's Chart, lab work & pertinent test results  History of Anesthesia Complications Negative for: history of anesthetic complications  Airway Mallampati: II  TM Distance: >3 FB Neck ROM: Full    Dental  (+) Dental Advisory Given, Edentulous Upper, Edentulous Lower   Pulmonary neg shortness of breath, neg sleep apnea, neg COPD, neg recent URI, former smoker   breath sounds clear to auscultation       Cardiovascular negative cardio ROS  Rhythm:Regular  TTE 03/09/2021:  1. Left ventricular ejection fraction, by estimation, is 60 to 65%. The  left ventricle has normal function. The left ventricle has no regional  wall motion abnormalities. Left ventricular diastolic parameters were  normal.   2. Right ventricular systolic function is normal. The right ventricular  size is normal. Tricuspid regurgitation signal is inadequate for assessing  PA pressure.   3. The mitral valve is normal in structure. Trivial mitral valve  regurgitation. No evidence of mitral stenosis.   4. The aortic valve was not well visualized. Aortic valve regurgitation  is not visualized. No aortic stenosis is present.   5. The inferior vena cava is normal in size with <50% respiratory  variability, suggesting right atrial pressure of 8 mmHg.    Exercise nuclear stress test 12/20/2016 (care everywhere): IMPRESSION:  1. Reduced exercise tolerance.  2. Electrically probably negative exercise treadmill.  Mild breathing restriction/tightness with exercise.  3. Normal and good quality SPECT Cardiolite study without convincing evidence for infarction or ischemia. This is a low risk perfusion result..  4. Preserved left ventricular function. Ejection fraction was quantitated at 71% without wall motion abnormalities.        Neuro/Psych negative neurological ROS  negative psych ROS   GI/Hepatic Neg liver ROS, hiatal hernia,GERD  ,,  Endo/Other  negative endocrine ROS    Renal/GU negative Renal ROS     Musculoskeletal  (+) Arthritis ,    Abdominal   Peds  Hematology negative hematology ROS (+) Lab Results      Component                Value               Date                      WBC                      7.3                 07/11/2022                HGB                      14.1                07/11/2022                HCT                      43.3                07/11/2022                MCV  88.9                07/11/2022                PLT                      273                 07/11/2022              Anesthesia Other Findings   Reproductive/Obstetrics                             Anesthesia Physical Anesthesia Plan  ASA: 2  Anesthesia Plan: General and Regional   Post-op Pain Management: Tylenol PO (pre-op)* and Regional block*   Induction: Intravenous  PONV Risk Score and Plan: 3 and Ondansetron, Dexamethasone, Propofol infusion and TIVA  Airway Management Planned: Oral ETT  Additional Equipment: None  Intra-op Plan:   Post-operative Plan: Extubation in OR  Informed Consent: I have reviewed the patients History and Physical, chart, labs and discussed the procedure including the risks, benefits and alternatives for the proposed anesthesia with the patient or authorized representative who has indicated his/her understanding and acceptance.     Dental advisory given  Plan Discussed with: CRNA  Anesthesia Plan Comments: (PAT note by Antionette Poles, PA-C: 72 year old female with pertinent history including hiatal hernia, GERD, palpitations.  Patient had prior cardiology evaluation by Dr. Jens Som in 2017 for DOE.  Echo and ETT were normal and her symptoms were felt likely due to deconditioning.  More recently she was seen by Dr.  Eden Emms on 02/20/2021 with complaint of palpitations.  Discussed that they may be related to prescription medications for IBS including Apriso.  Echo showed EF 60 to 65%, normal valves.  14-day event monitor showed underlying rhythm to be sinus with very short bursts of SVT.  She was prescribed Inderal 10 mg to use as needed.  Preop labs reviewed, unremarkable.  EKG 07/11/22: NSR. Rate 73.  Event monitor 03/09/2021: Patient had a min HR of 62 bpm, max HR of 218 bpm, and avg HR of 87 bpm. Predominant underlying rhythm was Sinus Rhythm. 15 Supraventricular Tachycardia runs occurred, the run with the fastest interval lasting 5 beats with a max rate of 218 bpm, the longest lasting 11 beats with an avg rate of 165 bpm. Isolated SVEs were rare (<1.0%), SVE Couplets were rare (<1.0%), and SVE Triplets were rare (<1.0%). Isolated VEs were rare (<1.0%), VE Couplets were rare (<1.0%), and no VE Triplets were present.   TTE 03/09/2021: 1. Left ventricular ejection fraction, by estimation, is 60 to 65%. The  left ventricle has normal function. The left ventricle has no regional  wall motion abnormalities. Left ventricular diastolic parameters were  normal.  2. Right ventricular systolic function is normal. The right ventricular  size is normal. Tricuspid regurgitation signal is inadequate for assessing  PA pressure.  3. The mitral valve is normal in structure. Trivial mitral valve  regurgitation. No evidence of mitral stenosis.  4. The aortic valve was not well visualized. Aortic valve regurgitation  is not visualized. No aortic stenosis is present.  5. The inferior vena cava is normal in size with <50% respiratory  variability, suggesting right atrial pressure of 8 mmHg.   Exercise nuclear stress test 12/20/2016 (care everywhere): IMPRESSION:  1. Reduced exercise tolerance.  2. Electrically probably negative exercise treadmill.  Mild breathing restriction/tightness with exercise.  3. Normal and good  quality SPECT Cardiolite study without convincing evidence for infarction or ischemia. This is a low risk perfusion result..  4. Preserved left ventricular function. Ejection fraction was quantitated at 71% without wall motion abnormalities.    )        Anesthesia Quick Evaluation

## 2022-07-18 ENCOUNTER — Other Ambulatory Visit: Payer: Self-pay

## 2022-07-18 ENCOUNTER — Observation Stay (HOSPITAL_COMMUNITY): Payer: Medicare Other

## 2022-07-18 ENCOUNTER — Encounter (HOSPITAL_COMMUNITY): Payer: Self-pay | Admitting: Orthopedic Surgery

## 2022-07-18 ENCOUNTER — Ambulatory Visit (HOSPITAL_COMMUNITY): Payer: Medicare Other | Admitting: Physician Assistant

## 2022-07-18 ENCOUNTER — Encounter (HOSPITAL_COMMUNITY)
Admission: RE | Disposition: A | Discharge status: Home / Self Care | Payer: Self-pay | Source: Ambulatory Visit | Attending: Orthopedic Surgery

## 2022-07-18 ENCOUNTER — Ambulatory Visit (HOSPITAL_BASED_OUTPATIENT_CLINIC_OR_DEPARTMENT_OTHER): Payer: Medicare Other | Admitting: Registered Nurse

## 2022-07-18 ENCOUNTER — Observation Stay (HOSPITAL_COMMUNITY)
Admission: RE | Admit: 2022-07-18 | Discharge: 2022-07-19 | Disposition: A | Discharge status: Home / Self Care | Payer: Medicare Other | Source: Ambulatory Visit | Attending: Orthopedic Surgery | Admitting: Orthopedic Surgery

## 2022-07-18 DIAGNOSIS — Z96653 Presence of artificial knee joint, bilateral: Secondary | ICD-10-CM | POA: Diagnosis not present

## 2022-07-18 DIAGNOSIS — Z85828 Personal history of other malignant neoplasm of skin: Secondary | ICD-10-CM | POA: Diagnosis not present

## 2022-07-18 DIAGNOSIS — Z79899 Other long term (current) drug therapy: Secondary | ICD-10-CM | POA: Insufficient documentation

## 2022-07-18 DIAGNOSIS — K449 Diaphragmatic hernia without obstruction or gangrene: Secondary | ICD-10-CM

## 2022-07-18 DIAGNOSIS — M7522 Bicipital tendinitis, left shoulder: Secondary | ICD-10-CM

## 2022-07-18 DIAGNOSIS — M19012 Primary osteoarthritis, left shoulder: Secondary | ICD-10-CM

## 2022-07-18 DIAGNOSIS — Z01818 Encounter for other preprocedural examination: Secondary | ICD-10-CM

## 2022-07-18 DIAGNOSIS — Z87891 Personal history of nicotine dependence: Secondary | ICD-10-CM | POA: Insufficient documentation

## 2022-07-18 DIAGNOSIS — M19019 Primary osteoarthritis, unspecified shoulder: Secondary | ICD-10-CM | POA: Diagnosis present

## 2022-07-18 HISTORY — PX: BICEPT TENODESIS: SHX5116

## 2022-07-18 HISTORY — PX: REVERSE SHOULDER ARTHROPLASTY: SHX5054

## 2022-07-18 SURGERY — ARTHROPLASTY, SHOULDER, TOTAL, REVERSE
Anesthesia: Regional | Site: Shoulder | Laterality: Left

## 2022-07-18 MED ORDER — DEXAMETHASONE SODIUM PHOSPHATE 10 MG/ML IJ SOLN
INTRAMUSCULAR | Status: DC | PRN
  Administered 2022-07-18: 10 mg via INTRAVENOUS

## 2022-07-18 MED ORDER — VANCOMYCIN HCL 1000 MG IV SOLR
INTRAVENOUS | Status: AC
  Filled 2022-07-18: qty 20

## 2022-07-18 MED ORDER — METHOCARBAMOL 1000 MG/10ML IJ SOLN: 500.0000 mg | Freq: Four times a day (QID) | INTRAVENOUS | Status: DC | PRN

## 2022-07-18 MED ORDER — MIDAZOLAM HCL 2 MG/2ML IJ SOLN
INTRAMUSCULAR | Status: DC | PRN
  Administered 2022-07-18: 1 mg via INTRAVENOUS

## 2022-07-18 MED ORDER — BUPIVACAINE LIPOSOME 1.3 % IJ SUSP
INTRAMUSCULAR | Status: DC | PRN
  Administered 2022-07-18: 10 mL via PERINEURAL

## 2022-07-18 MED ORDER — BUPIVACAINE LIPOSOME 1.3 % IJ SUSP
INTRAMUSCULAR | Status: AC
  Filled 2022-07-18: qty 10

## 2022-07-18 MED ORDER — ACETAMINOPHEN 10 MG/ML IV SOLN: 1000.0000 mg | Freq: Once | INTRAVENOUS | Status: DC | PRN

## 2022-07-18 MED ORDER — TRANEXAMIC ACID-NACL 1000-0.7 MG/100ML-% IV SOLN
INTRAVENOUS | Status: AC
  Filled 2022-07-18: qty 100

## 2022-07-18 MED ORDER — ACETAMINOPHEN 160 MG/5ML PO SOLN: 1000.0000 mg | Freq: Once | ORAL | Status: DC | PRN

## 2022-07-18 MED ORDER — METHOCARBAMOL 500 MG PO TABS: 500.0000 mg | ORAL_TABLET | Freq: Four times a day (QID) | ORAL | Status: DC | PRN

## 2022-07-18 MED ORDER — METOCLOPRAMIDE HCL 5 MG/ML IJ SOLN: 5.0000 mg | Freq: Three times a day (TID) | INTRAMUSCULAR | Status: DC | PRN

## 2022-07-18 MED ORDER — ONDANSETRON HCL 4 MG/2ML IJ SOLN
INTRAMUSCULAR | Status: DC | PRN
  Administered 2022-07-18: 4 mg via INTRAVENOUS

## 2022-07-18 MED ORDER — CEFAZOLIN SODIUM-DEXTROSE 2-4 GM/100ML-% IV SOLN
INTRAVENOUS | Status: AC
  Filled 2022-07-18: qty 100

## 2022-07-18 MED ORDER — ACETAMINOPHEN 500 MG PO TABS: 1000.0000 mg | ORAL_TABLET | Freq: Once | ORAL | Status: DC | PRN

## 2022-07-18 MED ORDER — 0.9 % SODIUM CHLORIDE (POUR BTL) OPTIME
TOPICAL | Status: DC | PRN
  Administered 2022-07-18 (×6): 1000 mL

## 2022-07-18 MED ORDER — MORPHINE SULFATE (PF) 2 MG/ML IV SOLN: 0.5000 mg | INTRAVENOUS | Status: DC | PRN

## 2022-07-18 MED ORDER — CEFAZOLIN SODIUM-DEXTROSE 1-4 GM/50ML-% IV SOLN
1.0000 g | Freq: Four times a day (QID) | INTRAVENOUS | Status: AC
  Administered 2022-07-18 – 2022-07-19 (×3): 1 g via INTRAVENOUS
  Filled 2022-07-18 (×3): qty 50

## 2022-07-18 MED ORDER — HYDROCODONE-ACETAMINOPHEN 5-325 MG PO TABS
1.0000 | ORAL_TABLET | ORAL | Status: DC | PRN
  Administered 2022-07-18 – 2022-07-19 (×3): 2 via ORAL
  Filled 2022-07-18 (×3): qty 2

## 2022-07-18 MED ORDER — SUGAMMADEX SODIUM 200 MG/2ML IV SOLN
INTRAVENOUS | Status: DC | PRN
  Administered 2022-07-18: 158.8 mg via INTRAVENOUS

## 2022-07-18 MED ORDER — TRANEXAMIC ACID-NACL 1000-0.7 MG/100ML-% IV SOLN
1000.0000 mg | INTRAVENOUS | Status: AC
  Administered 2022-07-18: 1000 mg via INTRAVENOUS

## 2022-07-18 MED ORDER — POVIDONE-IODINE 10 % EX SWAB
2.0000 | Freq: Once | CUTANEOUS | Status: AC
  Administered 2022-07-18: 2 via TOPICAL

## 2022-07-18 MED ORDER — PROPOFOL 500 MG/50ML IV EMUL
INTRAVENOUS | Status: DC | PRN
  Administered 2022-07-18: 20 mg via INTRAVENOUS
  Administered 2022-07-18: 40 mg via INTRAVENOUS
  Administered 2022-07-18: 150 ug/kg/min via INTRAVENOUS
  Administered 2022-07-18: 20 mg via INTRAVENOUS

## 2022-07-18 MED ORDER — PHENYLEPHRINE HCL-NACL 20-0.9 MG/250ML-% IV SOLN
INTRAVENOUS | Status: DC | PRN
  Administered 2022-07-18: 50 ug/min via INTRAVENOUS

## 2022-07-18 MED ORDER — FENTANYL CITRATE (PF) 100 MCG/2ML IJ SOLN
25.0000 ug | INTRAMUSCULAR | Status: DC | PRN
  Administered 2022-07-18 (×2): 25 ug via INTRAVENOUS

## 2022-07-18 MED ORDER — POVIDONE-IODINE 7.5 % EX SOLN
Freq: Once | CUTANEOUS | Status: DC
  Filled 2022-07-18: qty 118

## 2022-07-18 MED ORDER — MIRABEGRON ER 25 MG PO TB24
25.0000 mg | ORAL_TABLET | Freq: Every day | ORAL | Status: DC
  Administered 2022-07-18: 25 mg via ORAL
  Filled 2022-07-18 (×2): qty 1

## 2022-07-18 MED ORDER — ACETAMINOPHEN 10 MG/ML IV SOLN
INTRAVENOUS | Status: AC
  Filled 2022-07-18: qty 100

## 2022-07-18 MED ORDER — OXYCODONE HCL 5 MG PO TABS: 5.0000 mg | ORAL_TABLET | Freq: Once | ORAL | Status: DC | PRN

## 2022-07-18 MED ORDER — HYOSCYAMINE SULFATE 0.125 MG SL SUBL: 0.1250 mg | SUBLINGUAL_TABLET | Freq: Four times a day (QID) | SUBLINGUAL | Status: DC | PRN

## 2022-07-18 MED ORDER — DOCUSATE SODIUM 100 MG PO CAPS
100.0000 mg | ORAL_CAPSULE | Freq: Two times a day (BID) | ORAL | Status: DC
  Administered 2022-07-18: 100 mg via ORAL
  Filled 2022-07-18: qty 1

## 2022-07-18 MED ORDER — PHENOL 1.4 % MT LIQD: 1.0000 | OROMUCOSAL | Status: DC | PRN

## 2022-07-18 MED ORDER — FENTANYL CITRATE (PF) 100 MCG/2ML IJ SOLN
INTRAMUSCULAR | Status: AC
  Filled 2022-07-18: qty 2

## 2022-07-18 MED ORDER — ONDANSETRON HCL 4 MG PO TABS: 4.0000 mg | ORAL_TABLET | Freq: Four times a day (QID) | ORAL | Status: DC | PRN

## 2022-07-18 MED ORDER — BUPIVACAINE-EPINEPHRINE (PF) 0.5% -1:200000 IJ SOLN
INTRAMUSCULAR | Status: DC | PRN
  Administered 2022-07-18: 20 mL via PERINEURAL

## 2022-07-18 MED ORDER — METOCLOPRAMIDE HCL 5 MG PO TABS: 5.0000 mg | ORAL_TABLET | Freq: Three times a day (TID) | ORAL | Status: DC | PRN

## 2022-07-18 MED ORDER — PROPOFOL 10 MG/ML IV BOLUS
INTRAVENOUS | Status: AC
  Filled 2022-07-18: qty 20

## 2022-07-18 MED ORDER — CHLORHEXIDINE GLUCONATE 0.12 % MT SOLN
OROMUCOSAL | Status: AC
  Administered 2022-07-18: 15 mL via OROMUCOSAL
  Filled 2022-07-18: qty 15

## 2022-07-18 MED ORDER — FENTANYL CITRATE (PF) 250 MCG/5ML IJ SOLN
INTRAMUSCULAR | Status: AC
  Filled 2022-07-18: qty 5

## 2022-07-18 MED ORDER — CEFAZOLIN SODIUM-DEXTROSE 2-4 GM/100ML-% IV SOLN
2.0000 g | INTRAVENOUS | Status: AC
  Administered 2022-07-18: 2 g via INTRAVENOUS

## 2022-07-18 MED ORDER — ACETAMINOPHEN 10 MG/ML IV SOLN
INTRAVENOUS | Status: DC | PRN
  Administered 2022-07-18: 1000 mg via INTRAVENOUS

## 2022-07-18 MED ORDER — LACTATED RINGERS IV SOLN: INTRAVENOUS | Status: DC

## 2022-07-18 MED ORDER — ONDANSETRON HCL 4 MG/2ML IJ SOLN: 4.0000 mg | Freq: Four times a day (QID) | INTRAMUSCULAR | Status: DC | PRN

## 2022-07-18 MED ORDER — MIDAZOLAM HCL 2 MG/2ML IJ SOLN
INTRAMUSCULAR | Status: AC
  Filled 2022-07-18: qty 2

## 2022-07-18 MED ORDER — MENTHOL 3 MG MT LOZG: 1.0000 | LOZENGE | OROMUCOSAL | Status: DC | PRN

## 2022-07-18 MED ORDER — FENTANYL CITRATE (PF) 250 MCG/5ML IJ SOLN
INTRAMUSCULAR | Status: DC | PRN
  Administered 2022-07-18 (×3): 50 ug via INTRAVENOUS

## 2022-07-18 MED ORDER — OXYCODONE HCL 5 MG/5ML PO SOLN: 5.0000 mg | Freq: Once | ORAL | Status: DC | PRN

## 2022-07-18 MED ORDER — ORAL CARE MOUTH RINSE: 15.0000 mL | Freq: Once | OROMUCOSAL | Status: AC

## 2022-07-18 MED ORDER — PROPOFOL 10 MG/ML IV BOLUS
INTRAVENOUS | Status: DC | PRN
  Administered 2022-07-18: 20 mg via INTRAVENOUS
  Administered 2022-07-18: 110 mg via INTRAVENOUS

## 2022-07-18 MED ORDER — ROCURONIUM BROMIDE 10 MG/ML (PF) SYRINGE
PREFILLED_SYRINGE | INTRAVENOUS | Status: DC | PRN
  Administered 2022-07-18: 50 mg via INTRAVENOUS

## 2022-07-18 MED ORDER — VANCOMYCIN HCL 1000 MG IV SOLR
INTRAVENOUS | Status: DC | PRN
  Administered 2022-07-18: 1000 mg via TOPICAL

## 2022-07-18 MED ORDER — CHLORHEXIDINE GLUCONATE 0.12 % MT SOLN: 15.0000 mL | Freq: Once | OROMUCOSAL | Status: AC

## 2022-07-18 MED ORDER — ACETAMINOPHEN 500 MG PO TABS
500.0000 mg | ORAL_TABLET | Freq: Four times a day (QID) | ORAL | Status: DC
  Administered 2022-07-18 – 2022-07-19 (×2): 500 mg via ORAL
  Filled 2022-07-18 (×3): qty 1

## 2022-07-18 MED ORDER — IRRISEPT - 450ML BOTTLE WITH 0.05% CHG IN STERILE WATER, USP 99.95% OPTIME
TOPICAL | Status: DC | PRN
  Administered 2022-07-18: 450 mL via TOPICAL

## 2022-07-18 MED ORDER — POTASSIUM CHLORIDE IN NACL 20-0.9 MEQ/L-% IV SOLN
INTRAVENOUS | Status: DC
  Filled 2022-07-18: qty 1000

## 2022-07-18 SURGICAL SUPPLY — 80 items
AID PSTN UNV HD RSTRNT DISP (MISCELLANEOUS) ×1
ALCOHOL 70% 16 OZ (MISCELLANEOUS) ×1 IMPLANT
APL PRP STRL LF DISP 70% ISPRP (MISCELLANEOUS) ×1
BAG COUNTER SPONGE SURGICOUNT (BAG) ×1 IMPLANT
BAG SPNG CNTER NS LX DISP (BAG) ×1
BASEPLATE AUG MED W-TAPER (Plate) IMPLANT
BEARING HUMERAL SHLDER 36M STD (Shoulder) IMPLANT
BIT DRILL 2.7 W/STOP DISP (BIT) IMPLANT
BIT DRILL F/CENTRAL SCRW 3.2 (BIT) ×1
BIT DRILL F/CENTRAL SCRW 3.2MM (BIT) IMPLANT
BIT DRILL TWIST 2.7 (BIT) IMPLANT
BLADE SAW SGTL 13X75X1.27 (BLADE) ×1 IMPLANT
BRNG HUM STD 36 RVRS SHLDR (Shoulder) ×1 IMPLANT
BSPLAT GLND MED AUG TPR ADPR (Plate) ×1 IMPLANT
CHLORAPREP W/TINT 26 (MISCELLANEOUS) ×1 IMPLANT
COOLER ICEMAN CLASSIC (MISCELLANEOUS) ×1 IMPLANT
COVER SURGICAL LIGHT HANDLE (MISCELLANEOUS) ×1 IMPLANT
DRAPE INCISE IOBAN 66X45 STRL (DRAPES) ×1 IMPLANT
DRAPE U-SHAPE 47X51 STRL (DRAPES) ×2 IMPLANT
DRILL BIT F/CENTRAL SCRW 3.2MM (BIT) ×1
DRSG AQUACEL AG ADV 3.5X10 (GAUZE/BANDAGES/DRESSINGS) ×1 IMPLANT
ELECT BLADE 4.0 EZ CLEAN MEGAD (MISCELLANEOUS) ×1
ELECT REM PT RETURN 9FT ADLT (ELECTROSURGICAL) ×1
ELECTRODE BLDE 4.0 EZ CLN MEGD (MISCELLANEOUS) ×1 IMPLANT
ELECTRODE REM PT RTRN 9FT ADLT (ELECTROSURGICAL) ×1 IMPLANT
GAUZE SPONGE 4X4 12PLY STRL LF (GAUZE/BANDAGES/DRESSINGS) ×1 IMPLANT
GLENOID SPHERE STD STRL 36MM (Orthopedic Implant) IMPLANT
GLOVE BIOGEL PI IND STRL 6.5 (GLOVE) ×1 IMPLANT
GLOVE BIOGEL PI IND STRL 8 (GLOVE) ×1 IMPLANT
GLOVE ECLIPSE 6.5 STRL STRAW (GLOVE) ×1 IMPLANT
GLOVE ECLIPSE 8.0 STRL XLNG CF (GLOVE) ×1 IMPLANT
GOWN STRL REUS W/ TWL LRG LVL3 (GOWN DISPOSABLE) ×1 IMPLANT
GOWN STRL REUS W/ TWL XL LVL3 (GOWN DISPOSABLE) ×1 IMPLANT
GOWN STRL REUS W/TWL LRG LVL3 (GOWN DISPOSABLE) ×1
GOWN STRL REUS W/TWL XL LVL3 (GOWN DISPOSABLE) ×1
GUIDE RSA SHLD BM ROT L SU (ORTHOPEDIC DISPOSABLE SUPPLIES) IMPLANT
HYDROGEN PEROXIDE 16OZ (MISCELLANEOUS) ×1 IMPLANT
INST ROD SCREW-IN VERSION (INSTRUMENTS) ×1
INSTRUMENT ROD SCREW-IN VERSIN (INSTRUMENTS) IMPLANT
JET LAVAGE IRRISEPT WOUND (IRRIGATION / IRRIGATOR) ×1
KIT BASIN OR (CUSTOM PROCEDURE TRAY) ×1 IMPLANT
KIT TURNOVER KIT B (KITS) ×1 IMPLANT
LAVAGE JET IRRISEPT WOUND (IRRIGATION / IRRIGATOR) ×1 IMPLANT
MANIFOLD NEPTUNE II (INSTRUMENTS) ×1 IMPLANT
NDL SUT 6 .5 CRC .975X.05 MAYO (NEEDLE) IMPLANT
NEEDLE MAYO TAPER (NEEDLE) ×1
NS IRRIG 1000ML POUR BTL (IV SOLUTION) ×1 IMPLANT
PACK SHOULDER (CUSTOM PROCEDURE TRAY) ×1 IMPLANT
PAD COLD SHLDR WRAP-ON (PAD) ×1 IMPLANT
PIN STEINMANN THREADED TIP (PIN) IMPLANT
PIN THREADED REVERSE (PIN) IMPLANT
REAMER GUIDE BUSHING SURG DISP (MISCELLANEOUS) IMPLANT
REAMER GUIDE W/SCREW AUG (MISCELLANEOUS) IMPLANT
RESTRAINT HEAD UNIVERSAL NS (MISCELLANEOUS) ×1 IMPLANT
RETRIEVER SUT HEWSON (MISCELLANEOUS) ×1 IMPLANT
SCREW CENTRAL 6.5X40 (Screw) IMPLANT
SCREW LOCKING 4.75MMX15MM (Screw) IMPLANT
SCREW LOCKING NS 4.75MMX20MM (Screw) IMPLANT
SCREW LOCKING STRL 4.75X25X3.5 (Screw) IMPLANT
SHOULDER HUMERAL BEAR 36M STD (Shoulder) ×1 IMPLANT
SLING ARM IMMOBILIZER LRG (SOFTGOODS) ×1 IMPLANT
SOL PREP POV-IOD 4OZ 10% (MISCELLANEOUS) ×1 IMPLANT
SPONGE T-LAP 18X18 ~~LOC~~+RFID (SPONGE) ×1 IMPLANT
STEM HUMERAL STRL 11MMX83MM (Stem) IMPLANT
STRIP CLOSURE SKIN 1/2X4 (GAUZE/BANDAGES/DRESSINGS) ×1 IMPLANT
SUCTION TUBE FRAZIER 10FR DISP (SUCTIONS) ×1 IMPLANT
SUT BROADBAND TAPE 2PK 1.5 (SUTURE) IMPLANT
SUT ETHILON 3 0 PS 1 (SUTURE) IMPLANT
SUT MNCRL AB 3-0 PS2 18 (SUTURE) ×1 IMPLANT
SUT SILK 2 0 TIES 10X30 (SUTURE) ×1 IMPLANT
SUT VIC AB 0 CT1 27 (SUTURE) ×3
SUT VIC AB 0 CT1 27XBRD ANBCTR (SUTURE) ×4 IMPLANT
SUT VIC AB 1 CT1 27 (SUTURE) ×4
SUT VIC AB 1 CT1 27XBRD ANBCTR (SUTURE) ×2 IMPLANT
SUT VIC AB 2-0 CT1 27 (SUTURE) ×5
SUT VIC AB 2-0 CT1 TAPERPNT 27 (SUTURE) ×3 IMPLANT
SUT VICRYL 0 UR6 27IN ABS (SUTURE) ×2 IMPLANT
TOWEL GREEN STERILE (TOWEL DISPOSABLE) ×1 IMPLANT
TRAY HUM REV SHOULDER STD +6 (Shoulder) IMPLANT
WATER STERILE IRR 1000ML POUR (IV SOLUTION) ×1 IMPLANT

## 2022-07-18 NOTE — Anesthesia Postprocedure Evaluation (Signed)
Anesthesia Post Note  Patient: Joann [name]  Procedure(s) Performed: REVERSE SHOULDER ARTHROPLASTY (Left: Shoulder) BICEPS TENODESIS (Left: Shoulder)     Patient location during evaluation: PACU Anesthesia Type: Regional and General Level of consciousness: awake and alert Pain management: pain level controlled Vital Signs Assessment: post-procedure vital signs reviewed and stable Respiratory status: spontaneous breathing, nonlabored ventilation and respiratory function stable Cardiovascular status: blood pressure returned to baseline and stable Postop Assessment: no apparent nausea or vomiting Anesthetic complications: no   No notable events documented.  Last Vitals:  Vitals:   07/18/22 1309 07/18/22 1524  BP: 107/72 106/61  Pulse: 80 88  Resp: 16 17  Temp: 36.6 C 36.7 C  SpO2: 95% 96%    Last Pain:  Vitals:   07/18/22 1524  TempSrc: Oral  PainSc:                  [name]

## 2022-07-18 NOTE — H&P (Addendum)
Joann [name] is an 72 y.o. female.   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  She has had an injection in the glenohumeral joint which gave her about 50% relief.  Patient also reports a component of neck pain with this.  Cervical spine MRI shows moderate C4-5 left-sided foraminal stenosis but she has received no relief from any cervical spine intervention including Medrol Dosepak and cervical spine ESI.  MRI of the left shoulder demonstrates moderate arthritis with rotator cuff tendinosis.  No inferior axillary recess capsular thickening.  She does report in general more shoulder related symptoms as opposed to radicular related symptoms.  Pain is becoming more severe.  Presents now for operative management.  Past Medical History:  Diagnosis Date   Cancer (HCC)    Skin Cancer on Nose   Chronic diarrhea    due to IBS   Dyslipidemia    Dysrhythmia    Palpitations   GERD (gastroesophageal reflux disease)    Hiatal hernia    Hx   History of palpitations    History of skin cancer    nose   IBS (irritable bowel syndrome)    OA (osteoarthritis)    knees and lower back   OAB (overactive bladder)    Prolapsed internal hemorrhoids, grade 3    Wears contact lenses    Wears dentures    UPPER    Past Surgical History:  Procedure Laterality Date   ABDOMINAL HYSTERECTOMY  1996    W/ BSO   BREAST ENHANCEMENT SURGERY  1985   BUNIONECTOMY WITH HAMMERTOE RECONSTRUCTION Right 07-08-2014    @ [facility]   1st toe bunionectomy;  2nd toe hammertoe correction   CARDIOVASCULAR STRESS TEST  12-20-2016    [facility] , care everywhere in epic   norma nuclear perfusion study w/ no ischemia/  normal LV function and wall motion , ef 71%   CATARACT EXTRACTION, BILATERAL     COLONOSCOPY  07/2011   [facility]    COLONOSCOPY  2019   EYE SURGERY Bilateral    Laser  surgery to remove film after cataracts removed   FOOT SURGERY     2 bone spurs removed   FOOT SURGERY Left 2022   arthroscopy for arthritis   FOREIGN BODY REMOVAL Right 02/27/2016   Procedure: REMOVAL FOREIGN BODY EXTREMITY;  Surgeon: Cammy Copa, MD;  Location: [facility] OR;  Service: Orthopedics;  Laterality: Right;   HEMORRHOID SURGERY N/A 09/05/2017   Procedure: TWO COLUMN HEMORRHOIDECTOMY, HEMROIDPEXY;  Surgeon: Romie Levee, MD;  Location: [facility];  Service: General;  Laterality: N/A;   KNEE ARTHROPLASTY  01/15/2011   Procedure: COMPUTER ASSISTED TOTAL KNEE ARTHROPLASTY;  Surgeon: Cammy Copa;  Location: [facility] OR;  Service: Orthopedics;  Laterality: Right;  Right total knee arthroplasty   KNEE ARTHROSCOPY Left 12/2009   MOHS SURGERY     NASAL SINUS SURGERY  2017   TONSILLECTOMY  age 64   TOTAL KNEE ARTHROPLASTY Left 11-27-2010    dr Cyla Haluska   [facility]    Family History  Problem Relation Age of Onset   Alzheimer's disease Mother    Colon cancer Neg Hx    Stomach cancer Neg Hx    Rectal cancer Neg Hx    Esophageal cancer Neg Hx    Liver cancer Neg Hx    Social History:  reports that she quit smoking about 31 years ago. Her smoking use included cigarettes. She has a 8.00 pack-year smoking history. She has never used smokeless tobacco. She reports that she does not drink alcohol and does not use drugs.  Allergies: No Known Allergies  Medications Prior to Admission  Medication Sig Dispense Refill   acetaminophen (TYLENOL) 500 MG tablet Take 1,000 mg by mouth every 8 (eight) hours as needed for moderate pain.     Biotin 5000 MCG TABS Take by mouth.     hyoscyamine (LEVSIN SL) 0.125 MG SL tablet DISSOLVE 2 TABLETS IN MOUTH EVERY 6 HOURS AS NEEDED 60 tablet 2   OVER THE COUNTER MEDICATION Take 2 capsules by mouth daily. Neuriva     OVER THE COUNTER MEDICATION Take 3 capsules by mouth daily. Balance of nature: 3 veggie and 3 fruit capsules daily     Vibegron  (GEMTESA) 75 MG TABS Take 75 mg by mouth daily.      No results found for this or any previous visit (from the past 48 hour(s)). No results found.  Review of Systems  Musculoskeletal:  Positive for arthralgias.  All other systems reviewed and are negative.   Blood pressure 124/79, pulse 92, temperature 98.1 F (36.7 C), temperature source Oral, resp. rate 18, height 5\' 3"  (1.6 m), weight 79.4 kg, SpO2 96 %. Physical Exam Vitals reviewed.  HENT:     Head: Normocephalic.     Nose: Nose normal.     Mouth/Throat:     Mouth: Mucous membranes are moist.  Eyes:     Pupils: Pupils are equal, round, and reactive to light.  Cardiovascular:     Rate and Rhythm: Normal rate.     Pulses: Normal pulses.  Pulmonary:     Effort: Pulmonary effort is normal.  Abdominal:     General: Abdomen is flat.  Musculoskeletal:     Cervical back: Normal range of motion.  Skin:    General: Skin is warm.     Capillary Refill: Capillary refill takes less than 2 seconds.  Neurological:     General: No focal deficit present.     Mental Status: She is alert.  Psychiatric:        Mood and Affect: Mood normal.    Ortho exam demonstrates left shoulder with 30 degrees X rotation, 50 degrees abduction, 90 degrees forward elevation passively and actively.  This compared with the right shoulder with 60 degrees external rotation, 110 degrees abduction, 160 degrees forward elevation.  She has weakness of infraspinatus strength testing of the left shoulder rated 4/5 with 5/5 supraspinatus and subscapularis strength.  Axillary nerve is intact with deltoid firing in the left shoulder.  .  Intact EPL, pronation/supination, bicep, tricep, deltoid rated 5/5 bilaterally.  She has tenderness over the bicipital groove.  No tenderness over the [facility] joint on the right shoulder but mild tenderness on the left.  Negative Spurling sign.  Negative Lhermitte sign.  Bicep tendon reflexes are 1+ bilaterally.  Patient does have some  tenderness in the bicipital groove and some pain with resisted forward flexion with the palm up.  No discrete AC joint tenderness is present on the left-hand side and no pain with crossarm adduction. Assessment/Plan Impression is left shoulder arthritis with failure of conservative treatment.  She has had some relief from glenohumeral joint injections.  Signs symptoms and diagnostic injections point more towards shoulder source of pain as opposed to neck source of pain.  There potentially could be  a component of both but the foraminal stenosis on the left at C4-5 is only moderate.  Plan at this time is shoulder replacement.  The risk and benefits are discussed including not limited to infection nerve and vessel damage incomplete pain relief incomplete restoration of function as well as dislocation.  Expected rehab course is also discussed.  All questions answered.  She is having some biceps tendinitis symptoms as well which we can address at the time of shoulder replacement.  Burnard Bunting, MD 07/18/2022, 6:57 AM

## 2022-07-18 NOTE — Anesthesia Procedure Notes (Signed)
Procedure Name: Intubation Date/Time: 07/18/2022 8:04 AM  Performed by: Loleta Kismet Facemire, CRNAPre-anesthesia Checklist: Patient identified, Patient being monitored, Timeout performed, Emergency Drugs available and Suction available Patient Re-evaluated:Patient Re-evaluated prior to induction Oxygen Delivery Method: Circle system utilized Preoxygenation: Pre-oxygenation with 100% oxygen Induction Type: IV induction Ventilation: Mask ventilation without difficulty Laryngoscope Size: Mac and 3 Grade View: Grade II Tube type: Oral Tube size: 7.0 mm Number of attempts: 1 Airway Equipment and Method: Stylet Placement Confirmation: ETT inserted through vocal cords under direct vision, positive ETCO2 and breath sounds checked- equal and bilateral Secured at: 21 cm Tube secured with: Tape Dental Injury: Teeth and Oropharynx as per pre-operative assessment

## 2022-07-18 NOTE — Brief Op Note (Signed)
   07/18/2022  11:37 AM  PATIENT:  Joann [name]  72 y.o. female  PRE-OPERATIVE DIAGNOSIS:  left shoulder osteoarthritis,biceps tendonitis  POST-OPERATIVE DIAGNOSIS:  left shoulder osteoarthritis,biceps tendonitis  PROCEDURE:  Procedure(s): REVERSE SHOULDER ARTHROPLASTY BICEPS TENODESIS  SURGEON:  Surgeon(s): August Saucer, Corrie Mckusick, MD  ASSISTANT: Chilton Si rnfa  ANESTHESIA:   general  EBL: 100 ml    Total I/O In: 1700 [I.V.:1400; IV Piggyback:300] Out: 150 [Blood:150]  BLOOD ADMINISTERED: none  DRAINS: none   LOCAL MEDICATIONS USED:  vanco IMEN:  No Specimen  COUNTS:  YES  TOURNIQUET:  * No tourniquets in log *  DICTATION: .Other Dictation: Dictation Number [id number]  PLAN OF CARE: Admit for overnight observation  PATIENT DISPOSITION:  PACU - hemodynamically stable

## 2022-07-18 NOTE — Transfer of Care (Signed)
Immediate Anesthesia Transfer of Care Note  Patient: Joann [name]  Procedure(s) Performed: REVERSE SHOULDER ARTHROPLASTY (Left: Shoulder) BICEPS TENODESIS (Left: Shoulder)  Patient Location: PACU  Anesthesia Type:General and Regional  Level of Consciousness: drowsy  Airway & Oxygen Therapy: Patient Spontanous Breathing and Patient connected to face mask oxygen  Post-op Assessment: Report given to RN and Post -op Vital signs reviewed and stable  Post vital signs: Reviewed and stable  Last Vitals:  Vitals Value Taken Time  BP 111/74 07/18/22 1127  Temp    Pulse 89 07/18/22 1128  Resp 25 07/18/22 1128  SpO2 97 % 07/18/22 1128  Vitals shown include unvalidated device data.  Last Pain:  Vitals:   07/18/22 0617  TempSrc:   PainSc: 0-No pain         Complications: No notable events documented.

## 2022-07-18 NOTE — Anesthesia Procedure Notes (Signed)
Anesthesia Regional Block: Interscalene brachial plexus block   Pre-Anesthetic Checklist: , timeout performed,  Correct Patient, Correct Site, Correct Laterality,  Correct Procedure, Correct Position, site marked,  Risks and benefits discussed,  Surgical consent,  Pre-op evaluation,  At surgeon's request and post-op pain management  Laterality: Left and Upper  Prep: chloraprep       Needles:  Injection technique: Single-shot      Needle Length: 5cm  Needle Gauge: 22     Additional Needles: Arrow StimuQuik ECHO Echogenic Stimulating PNB Needle  Procedures:,,,, ultrasound used (permanent image in chart),,    Narrative:  Start time: 07/18/2022 7:33 AM End time: 07/18/2022 7:40 AM Injection made incrementally with aspirations every 5 mL.  Performed by: Personally  Anesthesiologist: Val Eagle, MD

## 2022-07-19 ENCOUNTER — Encounter (HOSPITAL_COMMUNITY): Payer: Self-pay | Admitting: Orthopedic Surgery

## 2022-07-19 DIAGNOSIS — M19012 Primary osteoarthritis, left shoulder: Secondary | ICD-10-CM | POA: Diagnosis not present

## 2022-07-19 MED ORDER — DOCUSATE SODIUM 100 MG PO CAPS: 100.0000 mg | ORAL_CAPSULE | Freq: Two times a day (BID) | ORAL | 0 refills | Status: DC

## 2022-07-19 MED ORDER — HYDROCODONE-ACETAMINOPHEN 5-325 MG PO TABS: 1.0000 | ORAL_TABLET | ORAL | 0 refills | Status: DC | PRN

## 2022-07-19 MED ORDER — METHOCARBAMOL 500 MG PO TABS: 500.0000 mg | ORAL_TABLET | Freq: Four times a day (QID) | ORAL | 0 refills | Status: DC | PRN

## 2022-07-19 MED ORDER — ASPIRIN 81 MG PO TBEC: 81.0000 mg | DELAYED_RELEASE_TABLET | Freq: Every day | ORAL | 0 refills | Status: AC

## 2022-07-19 NOTE — Progress Notes (Signed)
  Subjective: Patient stable.  Pain controlled.   Objective: Vital signs in last 24 hours: Temp:  [97.8 F (36.6 C)-99.5 F (37.5 C)] 98.9 F (37.2 C) (06/21 0431) Pulse Rate:  [72-91] 86 (06/21 0431) Resp:  [11-27] 18 (06/21 0431) BP: (100-128)/(54-76) 128/70 (06/21 0431) SpO2:  [89 %-99 %] 99 % (06/21 0431)  Intake/Output from previous day: 06/20 0701 - 06/21 0700 In: 2350 [P.O.:500; I.V.:1500; IV Piggyback:350] Out: 151 [Urine:1; Blood:150] Intake/Output this shift: No intake/output data recorded.  Exam:  Dressing dry on left shoulder.  Some swelling present.  Block still in effect.  Labs: No results for input(s): "HGB" in the last 72 hours. No results for input(s): "WBC", "RBC", "HCT", "PLT" in the last 72 hours. No results for input(s): "NA", "K", "CL", "CO2", "BUN", "CREATININE", "GLUCOSE", "CALCIUM" in the last 72 hours. No results for input(s): "LABPT", "INR" in the last 72 hours.  Assessment/Plan: Plan at this time is discharge to home after occupational therapy today.  Follow-up in 2 weeks.  Aspirin for DVT prophylaxis Norco for pain and Robaxin as muscle relaxer.   G Scott Shanicqua Coldren 07/19/2022, 8:00 AM

## 2022-07-19 NOTE — Evaluation (Signed)
Occupational Therapy Evaluation Patient Details Name: Joann [name] MRN: [id number] DOB: 25-Feb-1950 Today's Date: 07/19/2022   History of Present Illness 72 yo F adm for scheduled L TSA.  PMH includes arthritis, GERD, CA   Clinical Impression   Patient admitted for the procedure above.  Despite previous shoulder pain and limited AROM, the patient remains active and Ind with  all aspects of ADL, iADL and mobility.  Currently she is limited by the shoulder block and shoulder precautions, but should regain her independence with time and continued outpatient rehab once approved by the MD.  No further OT needs in the acute setting, Mod A for ADL this date, all precautions reviewed and education performed and documented in education section of the chart.  All questions answered, and recommend follow up as prescribed by MD.         Recommendations for follow up therapy are one component of a multi-disciplinary discharge planning process, led by the attending physician.  Recommendations may be updated based on patient status, additional functional criteria and insurance authorization.   Assistance Recommended at Discharge Intermittent Supervision/Assistance  Patient can return home with the following Assist for transportation;Assistance with cooking/housework;A little help with bathing/dressing/bathroom    Functional Status Assessment  Patient has had a recent decline in their functional status and demonstrates the ability to make significant improvements in function in a reasonable and predictable amount of time.  Equipment Recommendations  None recommended by OT    Recommendations for Other Services       Precautions / Restrictions Precautions Precautions: Shoulder Shoulder Interventions: Shoulder sling/immobilizer Precaution Booklet Issued: Yes (comment) Precaution Comments: verbalized understanding Required Braces or Orthoses: Sling Restrictions Weight Bearing Restrictions: Yes LUE  Weight Bearing: Non weight bearing      Mobility Bed Mobility Overal bed mobility: Modified Independent                  Transfers Overall transfer level: Modified independent                        Balance Overall balance assessment: No apparent balance deficits (not formally assessed)                                         ADL either performed or assessed with clinical judgement   ADL                   Upper Body Dressing : Moderate assistance;Standing   Lower Body Dressing: Moderate assistance;Sit to/from stand   Toilet Transfer: Supervision/safety                   Vision Patient Visual Report: No change from baseline       Perception     Praxis      Pertinent Vitals/Pain Pain Assessment Pain Assessment: Faces Faces Pain Scale: Hurts little more Pain Location: L shoulder Pain Descriptors / Indicators: Guarding, Grimacing, Sore Pain Intervention(s): Monitored during session     Hand Dominance Right   Extremity/Trunk Assessment Upper Extremity Assessment Upper Extremity Assessment: LUE deficits/detail LUE Deficits / Details: TSA Reverse LUE Sensation: decreased light touch;decreased proprioception LUE Coordination: decreased fine motor;decreased gross motor   Lower Extremity Assessment Lower Extremity Assessment: Overall WFL for tasks assessed   Cervical / Trunk Assessment Cervical / Trunk Assessment: Normal   Communication Communication Communication: No difficulties  Cognition Arousal/Alertness: Awake/alert Behavior During Therapy: WFL for tasks assessed/performed Overall Cognitive Status: Within Functional Limits for tasks assessed                                                        Home Living Family/patient expects to be discharged to:: Private residence Living Arrangements: Spouse/significant other Available Help at Discharge: Family;Available 24 hours/day Type  of Home: House Home Access: Level entry     Home Layout: One level     Bathroom Shower/Tub: Producer, television/film/video: Standard Bathroom Accessibility: Yes How Accessible: Accessible via walker Home Equipment: None          Prior Functioning/Environment Prior Level of Function : Independent/Modified Independent;Working/employed;Driving                        OT Problem List: Pain      OT Treatment/Interventions:      OT Goals(Current goals can be found in the care plan section) Acute Rehab OT Goals Patient Stated Goal: Return home OT Goal Formulation: With patient Time For Goal Achievement: 07/22/22 Potential to Achieve Goals: Good  OT Frequency:      Co-evaluation              AM-PAC OT "6 Clicks" Daily Activity     Outcome Measure Help from another person eating meals?: None Help from another person taking care of personal grooming?: A Little Help from another person toileting, which includes using toliet, bedpan, or urinal?: A Little Help from another person bathing (including washing, rinsing, drying)?: A Little Help from another person to put on and taking off regular upper body clothing?: A Lot Help from another person to put on and taking off regular lower body clothing?: A Lot 6 Click Score: 17   End of Session Nurse Communication: Mobility status  Activity Tolerance: Patient tolerated treatment well Patient left: in bed;with call bell/phone within reach  OT Visit Diagnosis: Pain Pain - Right/Left: Left Pain - part of body: Shoulder                Time: 0865-7846 OT Time Calculation (min): 29 min Charges:  OT General Charges $OT Visit: 1 Visit OT Evaluation $OT Eval Moderate Complexity: 1 Mod OT Treatments $Self Care/Home Management : 8-22 mins  07/19/2022  RP, OTR/L  Acute Rehabilitation Services  Office:  [phone]   Suzanna Obey 07/19/2022, 9:35 AM

## 2022-07-20 NOTE — Discharge Summary (Signed)
Physician Discharge Summary      Patient ID: Joann [name] MRN: [id number] DOB/AGE: 10-04-1950 72 y.o.  Admit date: 07/18/2022 Discharge date: 07/20/2022  Admission Diagnoses:  Principal Problem:   Shoulder arthritis   Discharge Diagnoses:  Same  Surgeries: Procedure(s): REVERSE SHOULDER ARTHROPLASTY BICEPS TENODESIS on 07/18/2022   Consultants:   Discharged Condition: Stable  Hospital Course: Joann [name] is an 72 y.o. female who was admitted 07/18/2022 with a chief complaint of left shoulder pain., and found to have a diagnosis of Shoulder arthritis.  They were brought to the operating room on 07/18/2022 and underwent the above named procedures.  Patient tolerated procedure well and was seen by Occupational Therapy on postop day #1.  Mobilized in the sling with passive range of motion.  She is discharged home in good condition and will continue a home exercise program with a CPM machine.  Start outpatient physical therapy 2 weeks after discharge.  Aspirin for DVT prophylaxis and pain medicine muscle relaxer for postop symptoms.  Antibiotics given:  Anti-infectives (From admission, onward)    Start     Dose/Rate Route Frequency Ordered Stop   07/18/22 1445  ceFAZolin (ANCEF) IVPB 1 g/50 mL premix        1 g 100 mL/hr over 30 Minutes Intravenous Every 6 hours 07/18/22 1348 07/19/22 0400   07/18/22 0836  vancomycin (VANCOCIN) powder  Status:  Discontinued          As needed 07/18/22 0836 07/18/22 1219   07/18/22 0600  ceFAZolin (ANCEF) IVPB 2g/100 mL premix        2 g 200 mL/hr over 30 Minutes Intravenous On call to O.R. 07/18/22 0549 07/18/22 0840   07/18/22 0552  ceFAZolin (ANCEF) 2-4 GM/100ML-% IVPB       Note to Pharmacy: Lacie Draft A: cabinet override      07/18/22 0552 07/18/22 0854     .  Recent vital signs:  Vitals:   07/19/22 0003 07/19/22 0431  BP: 102/61 128/70  Pulse: 76 86  Resp: 18 18  Temp: 98.7 F (37.1 C) 98.9 F (37.2 C)  SpO2: 93% 99%     Recent laboratory studies:  Results for orders placed or performed during the hospital encounter of 07/11/22  Surgical pcr screen   Specimen: Nasal Mucosa; Nasal Swab  Result Value Ref Range   MRSA, PCR NEGATIVE NEGATIVE   Staphylococcus aureus POSITIVE (A) NEGATIVE  CBC  Result Value Ref Range   WBC 7.3 4.0 - 10.5 K/uL   RBC 4.87 3.87 - 5.11 MIL/uL   Hemoglobin 14.1 12.0 - 15.0 g/dL   HCT 40.9 81.1 - 91.4 %   MCV 88.9 80.0 - 100.0 fL   MCH 29.0 26.0 - 34.0 pg   MCHC 32.6 30.0 - 36.0 g/dL   RDW 78.2 95.6 - 21.3 %   Platelets 273 150 - 400 K/uL   nRBC 0.0 0.0 - 0.2 %  Basic metabolic panel  Result Value Ref Range   Sodium 138 135 - 145 mmol/L   Potassium 4.0 3.5 - 5.1 mmol/L   Chloride 102 98 - 111 mmol/L   CO2 27 22 - 32 mmol/L   Glucose, Bld 109 (H) 70 - 99 mg/dL   BUN 11 8 - 23 mg/dL   Creatinine, Ser 0.86 0.44 - 1.00 mg/dL   Calcium 9.9 8.9 - 57.8 mg/dL   GFR, Estimated >46 >96 mL/min   Anion gap 9 5 - 15  Urinalysis, w/ Reflex to Culture (Infection  Suspected) -Urine, Clean Catch  Result Value Ref Range   Specimen Source URINE, CLEAN CATCH    Color, Urine YELLOW YELLOW   APPearance CLEAR CLEAR   Specific Gravity, Urine 1.015 1.005 - 1.030   pH 5.0 5.0 - 8.0   Glucose, UA NEGATIVE NEGATIVE mg/dL   Hgb urine dipstick NEGATIVE NEGATIVE   Bilirubin Urine NEGATIVE NEGATIVE   Ketones, ur NEGATIVE NEGATIVE mg/dL   Protein, ur NEGATIVE NEGATIVE mg/dL   Nitrite NEGATIVE NEGATIVE   Leukocytes,Ua NEGATIVE NEGATIVE   RBC / HPF 0-5 0 - 5 RBC/hpf   WBC, UA 0-5 0 - 5 WBC/hpf   Bacteria, UA NONE SEEN NONE SEEN   Squamous Epithelial / HPF 0-5 0 - 5 /HPF   Mucus PRESENT     Discharge Medications:   Allergies as of 07/19/2022   No Known Allergies      Medication List     STOP taking these medications    acetaminophen 500 MG tablet Commonly known as: TYLENOL   OVER THE COUNTER MEDICATION   OVER THE COUNTER MEDICATION       TAKE these medications     aspirin EC 81 MG tablet Take 1 tablet (81 mg total) by mouth daily for 21 days. Swallow whole.   Biotin 5000 MCG Tabs Take by mouth.   docusate sodium 100 MG capsule Commonly known as: COLACE Take 1 capsule (100 mg total) by mouth 2 (two) times daily.   Gemtesa 75 MG Tabs Generic drug: Vibegron Take 75 mg by mouth daily.   HYDROcodone-acetaminophen 5-325 MG tablet Commonly known as: NORCO/VICODIN Take 1-2 tablets by mouth every 4 (four) hours as needed for moderate pain (pain score 4-6).   hyoscyamine 0.125 MG SL tablet Commonly known as: LEVSIN SL DISSOLVE 2 TABLETS IN MOUTH EVERY 6 HOURS AS NEEDED   methocarbamol 500 MG tablet Commonly known as: ROBAXIN Take 1 tablet (500 mg total) by mouth every 6 (six) hours as needed for muscle spasms.        Diagnostic Studies: DG Shoulder Left Port  Result Date: 07/18/2022 CLINICAL DATA:  Shoulder arthritis. EXAM: LEFT SHOULDER COMPARISON:  None Available. FINDINGS: Reverse left shoulder arthroplasty in expected alignment. No periprosthetic lucency or fracture. Recent postsurgical change includes air and edema in the joint space and soft tissues. IMPRESSION: Reverse left shoulder arthroplasty without immediate postoperative complication. Electronically Signed   By: Narda Rutherford M.D.   On: 07/18/2022 12:57    Disposition: Discharge disposition: 01-Home or Self Care       Discharge Instructions     Call MD / Call 911   Complete by: As directed    If you experience chest pain or shortness of breath, CALL 911 and be transported to the hospital emergency room.  If you develope a fever above 101 F, pus (white drainage) or increased drainage or redness at the wound, or calf pain, call your surgeon's office.   Constipation Prevention   Complete by: As directed    Drink plenty of fluids.  Prune juice may be helpful.  You may use a stool softener, such as Colace (over the counter) 100 mg twice a day.  Use MiraLax (over the  counter) for constipation as needed.   Diet - low sodium heart healthy   Complete by: As directed    Discharge instructions   Complete by: As directed    From Dr. August Saucer. 1.  Okay for CPM 1 hour 3 times a day beginning today 2.  Okay  to shower with current dressing in place.  Dressing is waterproof 3.  Remain in sling for the first 2 weeks but okay to be out of the sling during CPM.  No lifting with left arm.  Plan to start outpatient physical therapy at the 2-week checkup   Increase activity slowly as tolerated   Complete by: As directed    Post-operative opioid taper instructions:   Complete by: As directed    POST-OPERATIVE OPIOID TAPER INSTRUCTIONS: It is important to wean off of your opioid medication as soon as possible. If you do not need pain medication after your surgery it is ok to stop day one. Opioids include: Codeine, Hydrocodone(Norco, Vicodin), Oxycodone(Percocet, oxycontin) and hydromorphone amongst others.  Long term and even short term use of opiods can cause: Increased pain response Dependence Constipation Depression Respiratory depression And more.  Withdrawal symptoms can include Flu like symptoms Nausea, vomiting And more Techniques to manage these symptoms Hydrate well Eat regular healthy meals Stay active Use relaxation techniques(deep breathing, meditating, yoga) Do Not substitute Alcohol to help with tapering If you have been on opioids for less than two weeks and do not have pain than it is ok to stop all together.  Plan to wean off of opioids This plan should start within one week post op of your joint replacement. Maintain the same interval or time between taking each dose and first decrease the dose.  Cut the total daily intake of opioids by one tablet each day Next start to increase the time between doses. The last dose that should be eliminated is the evening dose.             Signed: Burnard Bunting 07/20/2022, 5:35 PM

## 2022-07-24 NOTE — Op Note (Signed)
NAME: Joann [name], Joann [name] MEDICAL RECORD NO: [id number] ACCOUNT NO: [id number] DATE OF BIRTH: 1951/01/11 FACILITY: [facility] LOCATION: [facility] PHYSICIAN: Graylin Shiver. August Saucer, MD  Operative Report   DATE OF PROCEDURE: 07/18/2022  PREOPERATIVE DIAGNOSIS:  Left shoulder arthritis with biceps tendinitis.  POSTOPERATIVE DIAGNOSIS:  Left shoulder arthritis with biceps tendinitis.  PROCEDURE:  Left shoulder reverse shoulder replacement using Biomet medium augmented baseplate with 36 standard glenosphere, mini humeral stem size 11 and mini humeral tray, +6 tapered offset 40 mm in diameter with standard 36 mm bearing.  SURGEON:  Attending Cammy Copa, MD  ASSISTANT:  April Green, RNFA  INDICATIONS:  The patient is a 72 year old patient with refractory left shoulder arthritis and biceps tendinitis who presents for operative management after explanation of risks and benefits.  She has had significant pain in the left shoulder region,  only temporarily relieved with injections.  DESCRIPTION OF PROCEDURE:  The patient was brought to the operating room where general endotracheal anesthesia was induced.  Preoperative antibiotics administered.  Timeout was called.  Left shoulder examined under anesthesia.  Found to have about 50  degrees of external rotation, 90 degrees of abduction and about 140 of forward flexion.  The patient was placed in the beach chair position with head in neutral position.  Left shoulder, arm and hand prescrubbed with hydrogen peroxide followed by alcohol  and then Betadine, which was allowed to air dry, then prepped with ChloraPrep solution and draped in a sterile manner.  Ioban used to cover the operative field.  After calling timeout, IrriSept solution utilized, deltopectoral approach was made.   Cephalic vein mobilized laterally.  Subdeltoid adhesions were released.  Deltoid was elevated off its anterior attachment manually.  Axillary nerve was palpated and protected at all times  during the case.  At this time, the upper 1.5 cm of the pec was  released.  Biceps tendon was then tenodesed to the pec tendon using five 0 Vicryl sutures.  A fairly significant tendinosis was present within the bicipital groove.  Biceps tendon was then tagged and elevated superiorly and opened up the transverse  carpal ligament and the rotator interval to the base of the coracoid.  Next, the circumflex vessels were ligated.  Then, we detached the subscap using a 15 blade and progressive external rotation.  Capsular detachment was performed on the inferior  humeral neck about 2 cm below the anatomic neck around to the 7 o'clock position. The patient did have significant tendinosis and partial thickness tearing of that supraspinatus tendon.  The head was dislocated.  Moderate arthritis was present, both on  the humeral head and glenoid side.  Reaming was then performed up to a size 11. Head was then cut in 30 degrees of retroversion consistent with her native version. Broaching then performed up to a size 11, with end cap placed.  Next, retractor was placed  posteriorly.  Anterior retractor placed and glenoid was exposed.  The biceps tendon was released along with the labrum circumferentially.  Care was taken to avoid injury to the axillary nerve.  Bankart lesion created from the 12 o'clock down to the 6  o'clock position. The patient-specific guide was placed and pins were placed.  Appropriate reaming depth was performed using preoperative templating and guides.  Bone quality marginal.  Superior reaming was performed for the augment.  We obtained very  good bony contact.  The medium augmented baseplate was placed with one central compression screw, which obtained very good purchase of the 4 peripheral  locking screws.  Next, we trialed with a 36 standard glenosphere and a +6 tapered offset humeral tray  with standard bearing.  This did give very good stability with difficulty to reduce and difficulty to  re-dislocate.  The construct was "two fingers tight."  The patient had very good stability to extension, adduction and a forward force along with very  good internal and external rotation, 90 degrees of abduction without any impingement.  Trial components were removed.  Thorough irrigation was performed.  The true glenosphere was placed with about 1.5 mm inferior offset.  Next, we irrigated the canal of  the humerus with IrriSept solution and then placed vancomycin powder and placed the stem in.  Before we did that, we did place 6 sutures for subscap repair.  Next, the stem was placed. Same stability parameters were maintained and we put in the true +6  offset humeral tray with standard liner.  At this time, thorough irrigation was performed.  Axillary nerve again palpated and found to be intact.  Pouring irrigation utilized x4 liters. The subscap was then repaired with the arm in 30 degrees of external  rotation using the 6 SutureTapes and Nice knots.  Vancomycin powder was then placed on the prosthesis and the rotator interval was closed with the arm in 30 degrees of external rotation.  Next, the deltopectoral interval was closed using #1 Vicryl  suture followed by interrupted inverted 0 Vicryl suture, 2-0 Vicryl suture, and 3-0 Monocryl with Steri-Strips and Aquacel dressing applied.  At the conclusion of the case, the patient had about 55-60 degrees of external rotation with good endpoint and  excellent forward flexion and abduction without impingement or instability.  Medium shoulder sling was placed.  The patient transferred to recovery room in stable condition.   SHW D: 07/18/2022 11:45:32 am T: 07/18/2022 12:19:00 pm  JOB: [id number]/ [id number]

## 2022-07-25 ENCOUNTER — Telehealth: Payer: Self-pay | Admitting: Orthopedic Surgery

## 2022-07-25 ENCOUNTER — Other Ambulatory Visit: Payer: Self-pay | Admitting: Surgical

## 2022-07-25 MED ORDER — HYDROCODONE-ACETAMINOPHEN 5-325 MG PO TABS: 1.0000 | ORAL_TABLET | ORAL | 0 refills | Status: DC | PRN

## 2022-07-25 NOTE — Telephone Encounter (Signed)
Patient called. She would like a refill on hydrocodone. Her call back number is [phone]

## 2022-07-25 NOTE — Telephone Encounter (Signed)
Lvm advising  

## 2022-07-25 NOTE — Telephone Encounter (Signed)
Sent in

## 2022-07-28 DIAGNOSIS — M7522 Bicipital tendinitis, left shoulder: Secondary | ICD-10-CM

## 2022-07-29 ENCOUNTER — Telehealth: Payer: Self-pay | Admitting: Orthopedic Surgery

## 2022-07-29 NOTE — Telephone Encounter (Signed)
I called and sw pt to advise.  

## 2022-07-29 NOTE — Telephone Encounter (Signed)
I calld the pt and she states that she was having heart palpitations a few hours ago and it has gotten better but wondered if it could be due to medication. She is taking a BASA and Hydrocodone 5/325 but states that she has been taking this for a week without difficulty since her reverse total shoulder 07/18/2022. She is eating and drinking well and does not note any other problems she does have a follow up appt on Wednesday with Franky Macho at 3pm. Advised that she may want to contact her PCP just to make them aware and that I would forward this message to you for review.

## 2022-07-29 NOTE — Telephone Encounter (Signed)
I think contacting her PCP is the right move and I'm glad the palpitations have stopped

## 2022-07-29 NOTE — Telephone Encounter (Signed)
Patient called stating she had surgery with Dr. August Saucer last week. The medication she is on is the pain medicine and aspirin. She states she is having heart palpitations and would like a call back to discuss.

## 2022-07-31 ENCOUNTER — Ambulatory Visit (INDEPENDENT_AMBULATORY_CARE_PROVIDER_SITE_OTHER): Payer: Medicare Other | Admitting: Surgical

## 2022-07-31 ENCOUNTER — Other Ambulatory Visit (INDEPENDENT_AMBULATORY_CARE_PROVIDER_SITE_OTHER): Payer: Medicare Other

## 2022-07-31 ENCOUNTER — Encounter: Payer: Medicare Other | Admitting: Surgical

## 2022-07-31 DIAGNOSIS — M7502 Adhesive capsulitis of left shoulder: Secondary | ICD-10-CM

## 2022-07-31 MED ORDER — OXYCODONE HCL 5 MG PO TABS: 5.0000 mg | ORAL_TABLET | ORAL | 0 refills | Status: DC | PRN

## 2022-08-01 ENCOUNTER — Other Ambulatory Visit: Payer: Self-pay | Admitting: Gastroenterology

## 2022-08-02 ENCOUNTER — Encounter: Payer: Medicare Other | Admitting: Surgical

## 2022-08-02 ENCOUNTER — Encounter: Payer: Self-pay | Admitting: Surgical

## 2022-08-02 ENCOUNTER — Telehealth: Payer: Self-pay | Admitting: Orthopedic Surgery

## 2022-08-02 ENCOUNTER — Other Ambulatory Visit: Payer: Self-pay

## 2022-08-02 ENCOUNTER — Telehealth: Payer: Self-pay | Admitting: Gastroenterology

## 2022-08-02 DIAGNOSIS — M7502 Adhesive capsulitis of left shoulder: Secondary | ICD-10-CM

## 2022-08-02 MED ORDER — MESALAMINE ER 0.375 G PO CP24: ORAL_CAPSULE | ORAL | Status: DC

## 2022-08-02 NOTE — Telephone Encounter (Signed)
Pt called to give Dr August Saucer fax number to Physical Therapy facility pt would like to go to. Name is [facility] fax number is [phone]. Pt phone number is [phone]

## 2022-08-02 NOTE — Telephone Encounter (Signed)
Inbound call from patient requesting a refill for mesalamine. States she was unable to pick up prescription and it has now expired. Requesting a call back regarding refill. Please advise, thank you.

## 2022-08-02 NOTE — Progress Notes (Signed)
Post-Op Visit Note   Patient: Joann [name]           Date of Birth: 1950/12/24           MRN: [id number] Visit Date: 07/31/2022 PCP: Pcp, No   Assessment & Plan:  Chief Complaint:  Chief Complaint  Patient presents with   Left Shoulder - Routine Post Op   Visit Diagnoses:  1. Adhesive capsulitis of left shoulder     Plan: Joann [name] is a 72 y.o. female who presents s/p left reverse shoulder arthroplasty on 07/18/2022.  Patient is doing well and pain is overall controlled.  Using CPM machine.  Denies any chest pain, SOB, fevers, chills.  No complaint of any instability symptoms.  Taking pain medication every 4-6 hours.  Pain is still moderate to severe at times.  Having some difficulty sleeping.  On exam, patient has range of motion 10 degrees X rotation, 50 degrees abduction, 90 degrees forward elevation.  Intact EPL, FPL, finger abduction, finger adduction, pronation/supination, bicep, tricep, deltoid of operative extremity.  Axillary nerve intact with deltoid firing.  Incision is healing well without evidence of infection or dehiscence.  Incision was made sure to be covered with Steri-Strips from the proximal to distal aspect of the length of the incision.  2+ radial pulse of the operative extremity  Plan is discontinue sling.  Okay to very lightly lift with the operative extremity but no lifting anything heavier than a coffee cup or cell phone.  Start physical therapy to focus on passive range of motion and active range of motion with deltoid isometrics.  Do not want to externally rotate past 30 degrees to protect subscapularis repair.  Follow-up in 4 weeks for clinical recheck.    Follow-Up Instructions: No follow-ups on file.   Orders:  Orders Placed This Encounter  Procedures   XR Shoulder Left   Meds ordered this encounter  Medications   oxyCODONE (ROXICODONE) 5 MG immediate release tablet    Sig: Take 1 tablet (5 mg total) by mouth every 4 (four) hours as needed for  severe pain.    Dispense:  30 tablet    Refill:  0    Imaging: No results found.  PMFS History: Patient Active Problem List   Diagnosis Date Noted   Biceps tendonitis on left 07/28/2022   Shoulder arthritis 07/18/2022   Right upper quadrant pain 08/21/2017   Nausea without vomiting 08/21/2017   Hyperlipidemia 01/01/2017   Pre-diabetes 01/01/2017   Shortness of breath 01/01/2017   Palpitations 12/22/2016   Chest pain 12/19/2016   Hemorrhoids 12/19/2016   Past Medical History:  Diagnosis Date   Cancer (HCC)    Skin Cancer on Nose   Chronic diarrhea    due to IBS   Dyslipidemia    Dysrhythmia    Palpitations   GERD (gastroesophageal reflux disease)    Hiatal hernia    Hx   History of palpitations    History of skin cancer    nose   IBS (irritable bowel syndrome)    OA (osteoarthritis)    knees and lower back   OAB (overactive bladder)    Prolapsed internal hemorrhoids, grade 3    Wears contact lenses    Wears dentures    UPPER    Family History  Problem Relation Age of Onset   Alzheimer's disease Mother    Colon cancer Neg Hx    Stomach cancer Neg Hx    Rectal cancer Neg Hx  Esophageal cancer Neg Hx    Liver cancer Neg Hx     Past Surgical History:  Procedure Laterality Date   ABDOMINAL HYSTERECTOMY  1996    W/ BSO   BICEPT TENODESIS Left 07/18/2022   Procedure: BICEPS TENODESIS;  Surgeon: Cammy Copa, MD;  Location: [facility] OR;  Service: Orthopedics;  Laterality: Left;   BREAST ENHANCEMENT SURGERY  1985   BUNIONECTOMY WITH HAMMERTOE RECONSTRUCTION Right 07-08-2014    @ [facility]   1st toe bunionectomy;  2nd toe hammertoe correction   CARDIOVASCULAR STRESS TEST  12-20-2016    [facility] , care everywhere in epic   norma nuclear perfusion study w/ no ischemia/  normal LV function and wall motion , ef 71%   CATARACT EXTRACTION, BILATERAL     COLONOSCOPY  07/2011   [facility]    COLONOSCOPY  2019   EYE SURGERY Bilateral    Laser  surgery to remove film after cataracts removed   FOOT SURGERY     2 bone spurs removed   FOOT SURGERY Left 2022   arthroscopy for arthritis   FOREIGN BODY REMOVAL Right 02/27/2016   Procedure: REMOVAL FOREIGN BODY EXTREMITY;  Surgeon: Cammy Copa, MD;  Location: [facility] OR;  Service: Orthopedics;  Laterality: Right;   HEMORRHOID SURGERY N/A 09/05/2017   Procedure: TWO COLUMN HEMORRHOIDECTOMY, HEMROIDPEXY;  Surgeon: Romie Levee, MD;  Location: [facility];  Service: General;  Laterality: N/A;   KNEE ARTHROPLASTY  01/15/2011   Procedure: COMPUTER ASSISTED TOTAL KNEE ARTHROPLASTY;  Surgeon: Cammy Copa;  Location: [facility] OR;  Service: Orthopedics;  Laterality: Right;  Right total knee arthroplasty   KNEE ARTHROSCOPY Left 12/2009   MOHS SURGERY     NASAL SINUS SURGERY  2017   REVERSE SHOULDER ARTHROPLASTY Left 07/18/2022   Procedure: REVERSE SHOULDER ARTHROPLASTY;  Surgeon: Cammy Copa, MD;  Location: [facility] OR;  Service: Orthopedics;  Laterality: Left;  RNFA APRIL PLEASE   TONSILLECTOMY  age 49   TOTAL KNEE ARTHROPLASTY Left 11-27-2010    dr dean   [facility]   Social History   Occupational History   Occupation: court reporter-semi retired  Tobacco Use   Smoking status: Former    Packs/day: 0.50    Years: 16.00    Additional pack years: 0.00    Total pack years: 8.00    Types: Cigarettes    Quit date: 01/11/1991    Years since quitting: 31.5   Smokeless tobacco: Never  Vaping Use   Vaping Use: Never used  Substance and Sexual Activity   Alcohol use: No   Drug use: No   Sexual activity: Yes    Birth control/protection: Surgical

## 2022-08-02 NOTE — Telephone Encounter (Signed)
Refilled for a limited supply. Follow uo has been booked for October 2024

## 2022-08-13 ENCOUNTER — Telehealth: Payer: Self-pay

## 2022-08-13 ENCOUNTER — Other Ambulatory Visit (HOSPITAL_COMMUNITY): Payer: Self-pay

## 2022-08-13 NOTE — Telephone Encounter (Signed)
*  Gastro  PA request received for Mesalamine ER 0.375GM er capsules  PA submitted to [facility] via [facility] and is pending additional questions/determination  Key: [id number]

## 2022-08-16 ENCOUNTER — Telehealth: Payer: Self-pay | Admitting: Gastroenterology

## 2022-08-16 ENCOUNTER — Telehealth: Payer: Self-pay | Admitting: Orthopedic Surgery

## 2022-08-16 ENCOUNTER — Other Ambulatory Visit: Payer: Self-pay

## 2022-08-16 MED ORDER — MESALAMINE 1.2 G PO TBEC: 1.2000 g | DELAYED_RELEASE_TABLET | Freq: Two times a day (BID) | ORAL | 3 refills | Status: DC

## 2022-08-16 NOTE — Telephone Encounter (Signed)
Patti from [facility] called. Would like to know if there is a protocol that Dr. August Saucer would like for her to use? Cb# [phone] fax [phone]

## 2022-08-16 NOTE — Telephone Encounter (Addendum)
Rx for the Lialda has been sent to the pharmacy as requested as she is out of the Apriso. Pt will give an update if any complications with medication change.

## 2022-08-16 NOTE — Telephone Encounter (Signed)
Patient takes periodic apriso  We rec'd a fax from her [place] prescription carrier noting it is no longer covered due to the formulary  Alternatives:  Balsalazide Sulfasalazine Asacol 800 mg Lialda 1.2 gram Delzicol 400 mg  Of these, I recommend the Lialda 1.2 gram tablet.  When patient runs out of her apriso, next Rx needs to be for Lialda 1.2 gram tablet One tablet daily as needed for cramp/diarrhea Disp#60, RF 3  - HD

## 2022-08-19 ENCOUNTER — Telehealth: Payer: Self-pay | Admitting: Orthopedic Surgery

## 2022-08-19 NOTE — Telephone Encounter (Signed)
I called, Clayborne Dana had left for the day. Faxed rx with instructions to [phone].

## 2022-08-19 NOTE — Telephone Encounter (Signed)
Okay for passive and active range of motion exercises with no external rotation past 30 degrees.  Okay for deltoid isometric exercises but no internal rotation shoulder strengthening.

## 2022-08-19 NOTE — Telephone Encounter (Signed)
Patient called wanted a prescription for tramadol. CB#[phone]

## 2022-08-20 ENCOUNTER — Other Ambulatory Visit: Payer: Self-pay | Admitting: Surgical

## 2022-08-20 MED ORDER — TRAMADOL HCL 50 MG PO TABS: 50.0000 mg | ORAL_TABLET | Freq: Three times a day (TID) | ORAL | 0 refills | Status: DC | PRN

## 2022-08-20 NOTE — Telephone Encounter (Signed)
I left voicemail advising. ?

## 2022-08-20 NOTE — Telephone Encounter (Signed)
Sent in

## 2022-08-28 ENCOUNTER — Ambulatory Visit: Payer: Medicare Other | Admitting: Orthopedic Surgery

## 2022-08-28 DIAGNOSIS — M19012 Primary osteoarthritis, left shoulder: Secondary | ICD-10-CM

## 2022-08-28 MED ORDER — TRAMADOL HCL 50 MG PO TABS: 50.0000 mg | ORAL_TABLET | Freq: Three times a day (TID) | ORAL | 0 refills | Status: DC | PRN

## 2022-08-29 ENCOUNTER — Encounter: Payer: Self-pay | Admitting: Orthopedic Surgery

## 2022-08-29 NOTE — Progress Notes (Signed)
Post-Op Visit Note   Patient: Joann [name]           Date of Birth: 07/03/1950           MRN: [id number] Visit Date: 08/28/2022 PCP: Pcp, No   Assessment & Plan:  Chief Complaint:  Chief Complaint  Patient presents with   Left Shoulder - Routine Post Op    L RSA (surgery date 07-18-22)   Visit Diagnoses:  1. Arthritis of left shoulder region     Plan: Patient is now about 6 weeks out left reverse shoulder replacement.  She therapy has started doing isometrics and stretches.  She has an ice pack which she uses.  Hard for her to sleep on the left-hand side.  On exam range of motion is 15/80/90.  Tramadol helps her symptoms.  Tramadol refilled.  6-week return.  I do want her to focus mostly on stretching and range of motion exercises.  Follow-Up Instructions: Return in about 6 weeks (around 10/09/2022).   Orders:  No orders of the defined types were placed in this encounter.  Meds ordered this encounter  Medications   traMADol (ULTRAM) 50 MG tablet    Sig: Take 1 tablet (50 mg total) by mouth every 8 (eight) hours as needed.    Dispense:  30 tablet    Refill:  0    Imaging: No results found.  PMFS History: Patient Active Problem List   Diagnosis Date Noted   Biceps tendonitis on left 07/28/2022   Shoulder arthritis 07/18/2022   Right upper quadrant pain 08/21/2017   Nausea without vomiting 08/21/2017   Hyperlipidemia 01/01/2017   Pre-diabetes 01/01/2017   Shortness of breath 01/01/2017   Palpitations 12/22/2016   Chest pain 12/19/2016   Hemorrhoids 12/19/2016   Past Medical History:  Diagnosis Date   Cancer (HCC)    Skin Cancer on Nose   Chronic diarrhea    due to IBS   Dyslipidemia    Dysrhythmia    Palpitations   GERD (gastroesophageal reflux disease)    Hiatal hernia    Hx   History of palpitations    History of skin cancer    nose   IBS (irritable bowel syndrome)    OA (osteoarthritis)    knees and lower back   OAB (overactive bladder)     Prolapsed internal hemorrhoids, grade 3    Wears contact lenses    Wears dentures    UPPER    Family History  Problem Relation Age of Onset   Alzheimer's disease Mother    Colon cancer Neg Hx    Stomach cancer Neg Hx    Rectal cancer Neg Hx    Esophageal cancer Neg Hx    Liver cancer Neg Hx     Past Surgical History:  Procedure Laterality Date   ABDOMINAL HYSTERECTOMY  1996    W/ BSO   BICEPT TENODESIS Left 07/18/2022   Procedure: BICEPS TENODESIS;  Surgeon: Cammy Copa, MD;  Location: [facility] OR;  Service: Orthopedics;  Laterality: Left;   BREAST ENHANCEMENT SURGERY  1985   BUNIONECTOMY WITH HAMMERTOE RECONSTRUCTION Right 07-08-2014    @ [facility]   1st toe bunionectomy;  2nd toe hammertoe correction   CARDIOVASCULAR STRESS TEST  12-20-2016    [facility] , care everywhere in epic   norma nuclear perfusion study w/ no ischemia/  normal LV function and wall motion , ef 71%   CATARACT EXTRACTION, BILATERAL     COLONOSCOPY  07/2011   [facility]    COLONOSCOPY  2019   EYE SURGERY Bilateral    Laser surgery to remove film after cataracts removed   FOOT SURGERY     2 bone spurs removed   FOOT SURGERY Left 2022   arthroscopy for arthritis   FOREIGN BODY REMOVAL Right 02/27/2016   Procedure: REMOVAL FOREIGN BODY EXTREMITY;  Surgeon: Cammy Copa, MD;  Location: [facility] OR;  Service: Orthopedics;  Laterality: Right;   HEMORRHOID SURGERY N/A 09/05/2017   Procedure: TWO COLUMN HEMORRHOIDECTOMY, HEMROIDPEXY;  Surgeon: Romie Levee, MD;  Location: [facility];  Service: General;  Laterality: N/A;   KNEE ARTHROPLASTY  01/15/2011   Procedure: COMPUTER ASSISTED TOTAL KNEE ARTHROPLASTY;  Surgeon: Cammy Copa;  Location: [facility] OR;  Service: Orthopedics;  Laterality: Right;  Right total knee arthroplasty   KNEE ARTHROSCOPY Left 12/2009   MOHS SURGERY     NASAL SINUS SURGERY  2017   REVERSE SHOULDER ARTHROPLASTY Left 07/18/2022   Procedure: REVERSE  SHOULDER ARTHROPLASTY;  Surgeon: Cammy Copa, MD;  Location[facility] OR;  Service: Orthopedics;  Laterality: Left;  RNFA APRIL PLEASE   TONSILLECTOMY  age 68   TOTAL KNEE ARTHROPLASTY Left 11-27-2010    dr Dillyn Joaquin   [facility]   Social History   Occupational History   Occupation: court reporter-semi retired  Tobacco Use   Smoking status: Former    Current packs/day: 0.00    Average packs/day: 0.5 packs/day for 16.0 years (8.0 ttl pk-yrs)    Types: Cigarettes    Start date: 01/11/1975    Quit date: 01/11/1991    Years since quitting: 31.6   Smokeless tobacco: Never  Vaping Use   Vaping status: Never Used  Substance and Sexual Activity   Alcohol use: No   Drug use: No   Sexual activity: Yes    Birth control/protection: Surgical

## 2022-09-06 NOTE — Telephone Encounter (Signed)
We denied this request under Medicare Part D because: The requested drug is not on your plan's formulary (list of covered drugs). Your Medicare Part D drug plan was asked to cover a drug that is not on the formulary (this is called a formulary exception). Your prescriber did not provide the detailed information that is required in order to approve the request. To receive a formulary exception, per the Part D Coverage Determination guidance Section 40.5.2 and 40.5.3, your prescriber must provide information that documents at least one of the following has occurred:  - You have tried the formulary drugs for the treatment of your condition and they did not work for you. OR - The formulary drugs could cause adverse effects. OR - The formulary drugs would be less effective for your condition than the requested drug.  Talk to your prescriber to see if the following covered alternative(s) would be right for you:  Balsalazide Disodium capsule Sulfasalazine tablet and Delayed Release tablet Mesalamine DR tablet 800MG  Mesalamine DR tablet 1.2GM Mesalamine DR capsule 400mg  You should share a copy of this decision with your prescriber so you and your prescriber can discuss next steps.  If your prescriber requested coverage on your behalf, we have shared this decision with your prescriber.

## 2022-09-13 ENCOUNTER — Telehealth: Payer: Self-pay | Admitting: Orthopedic Surgery

## 2022-09-13 NOTE — Telephone Encounter (Signed)
Patient called and needs a prescription for PT for [facility] in [place]. The fax #[phone]. Her CB#[phone]

## 2022-09-17 ENCOUNTER — Other Ambulatory Visit: Payer: Self-pay | Admitting: Orthopedic Surgery

## 2022-10-30 ENCOUNTER — Ambulatory Visit (INDEPENDENT_AMBULATORY_CARE_PROVIDER_SITE_OTHER): Payer: Medicare Other | Admitting: Orthopedic Surgery

## 2022-10-30 DIAGNOSIS — M19012 Primary osteoarthritis, left shoulder: Secondary | ICD-10-CM

## 2022-11-01 ENCOUNTER — Encounter: Payer: Self-pay | Admitting: Orthopedic Surgery

## 2022-11-01 NOTE — Progress Notes (Signed)
Post-Op Visit Note   Patient: Joann [name]           Date of Birth: Jan 10, 1951           MRN: [id number] Visit Date: 10/30/2022 PCP: Pcp, No   Assessment & Plan:  Chief Complaint:  Chief Complaint  Patient presents with   Left Shoulder - Routine Post Op    L RSA (surgery date 07-18-22)   Visit Diagnoses:  1. Arthritis of left shoulder region     Plan: Patient presents now about 3 months out left reverse shoulder replacement.  Doing well.  Complains of some issues with reaching but overall the range of motion is good.  She finished physical therapy but is doing home exercise program.  On examination she has range of motion of 30/85/130.  Subscap strength 5 out of 5.  Has 6 tramadol left.  She is going to continue with stretching and strengthening exercises and follow-up as needed.  Follow-Up Instructions: No follow-ups on file.   Orders:  No orders of the defined types were placed in this encounter.  No orders of the defined types were placed in this encounter.   Imaging: No results found.  PMFS History: Patient Active Problem List   Diagnosis Date Noted   Biceps tendonitis on left 07/28/2022   Shoulder arthritis 07/18/2022   Right upper quadrant pain 08/21/2017   Nausea without vomiting 08/21/2017   Hyperlipidemia 01/01/2017   Pre-diabetes 01/01/2017   Shortness of breath 01/01/2017   Palpitations 12/22/2016   Chest pain 12/19/2016   Hemorrhoids 12/19/2016   Past Medical History:  Diagnosis Date   Cancer (HCC)    Skin Cancer on Nose   Chronic diarrhea    due to IBS   Dyslipidemia    Dysrhythmia    Palpitations   GERD (gastroesophageal reflux disease)    Hiatal hernia    Hx   History of palpitations    History of skin cancer    nose   IBS (irritable bowel syndrome)    OA (osteoarthritis)    knees and lower back   OAB (overactive bladder)    Prolapsed internal hemorrhoids, grade 3    Wears contact lenses    Wears dentures    UPPER    Family  History  Problem Relation Age of Onset   Alzheimer's disease Mother    Colon cancer Neg Hx    Stomach cancer Neg Hx    Rectal cancer Neg Hx    Esophageal cancer Neg Hx    Liver cancer Neg Hx     Past Surgical History:  Procedure Laterality Date   ABDOMINAL HYSTERECTOMY  1996    W/ BSO   BICEPT TENODESIS Left 07/18/2022   Procedure: BICEPS TENODESIS;  Surgeon: Cammy Copa, MD;  Location: [facility] OR;  Service: Orthopedics;  Laterality: Left;   BREAST ENHANCEMENT SURGERY  1985   BUNIONECTOMY WITH HAMMERTOE RECONSTRUCTION Right 07-08-2014    @ [facility]   1st toe bunionectomy;  2nd toe hammertoe correction   CARDIOVASCULAR STRESS TEST  12-20-2016    [facility] , care everywhere in epic   norma nuclear perfusion study w/ no ischemia/  normal LV function and wall motion , ef 71%   CATARACT EXTRACTION, BILATERAL     COLONOSCOPY  07/2011   [facility]    COLONOSCOPY  2019   EYE SURGERY Bilateral    Laser surgery to remove film after cataracts removed   FOOT SURGERY  2 bone spurs removed   FOOT SURGERY Left 2022   arthroscopy for arthritis   FOREIGN BODY REMOVAL Right 02/27/2016   Procedure: REMOVAL FOREIGN BODY EXTREMITY;  Surgeon: Cammy Copa, MD;  Location: [facility] OR;  Service: Orthopedics;  Laterality: Right;   HEMORRHOID SURGERY N/A 09/05/2017   Procedure: TWO COLUMN HEMORRHOIDECTOMY, HEMROIDPEXY;  Surgeon: Romie Levee, MD;  Location: [facility];  Service: General;  Laterality: N/A;   KNEE ARTHROPLASTY  01/15/2011   Procedure: COMPUTER ASSISTED TOTAL KNEE ARTHROPLASTY;  Surgeon: Cammy Copa;  Location: [facility] OR;  Service: Orthopedics;  Laterality: Right;  Right total knee arthroplasty   KNEE ARTHROSCOPY Left 12/2009   MOHS SURGERY     NASAL SINUS SURGERY  2017   REVERSE SHOULDER ARTHROPLASTY Left 07/18/2022   Procedure: REVERSE SHOULDER ARTHROPLASTY;  Surgeon: Cammy Copa, MD;  Location: [facility] OR;  Service: Orthopedics;   Laterality: Left;  RNFA APRIL PLEASE   TONSILLECTOMY  age 72   TOTAL KNEE ARTHROPLASTY Left 11-27-2010    dr Allayah Raineri   [facility]   Social History   Occupational History   Occupation: court reporter-semi retired  Tobacco Use   Smoking status: Former    Current packs/day: 0.00    Average packs/day: 0.5 packs/day for 16.0 years (8.0 ttl pk-yrs)    Types: Cigarettes    Start date: 01/11/1975    Quit date: 01/11/1991    Years since quitting: 31.8   Smokeless tobacco: Never  Vaping Use   Vaping status: Never Used  Substance and Sexual Activity   Alcohol use: No   Drug use: No   Sexual activity: Yes    Birth control/protection: Surgical

## 2022-11-13 ENCOUNTER — Ambulatory Visit: Payer: Medicare Other | Admitting: Gastroenterology

## 2022-11-13 NOTE — Progress Notes (Deleted)
Joann [name] GI Progress Note  Chief Complaint: ***  Subjective  History: Summary of GI issues from my March 2023 office note: "Joann [name] also has longstanding irritable bowel with diarrhea.  Last saw her, she was taking a dietary supplement she bought online that completely resolved her symptoms.  When she recently ran out, she has recurrent generalized abdominal bloating, mid to lower abdominal cramping pain and several loose oily stools per day with mucus.  These been her symptoms in the past, hyoscyamine helps somewhat but she needs several tablets and it causes dry mouth.  No prior SIBO testing or treatment.  Viberzi once daily helped a little, then twice daily caused severe constipation.  She was inquiring about another colonoscopy for her lower digestive symptoms. She continues to have heartburn symptoms with intermittent solid food dysphagia as when I had seen her in January.   Last colonoscopy August 2018, complete exam with good prep.  2 subcentimeter tubular adenomas removed in the right colon, 5-year recall recommended (but will be changed to 7 years based on updated guidelines). No polyps reported by Dr. Aleene Davidson on July 2013 colonoscopy." _________________ Joann [name] started taking some of her daughters mesalamine years ago for her symptoms and says just 1 or 2 doses will improve her diarrhea despite previous testing not revealing IBD or microscopic colitis.  Because it is a generally safe medication, I have prescribed it for intermittent use.  When last seen here, she did report that medicine seem to be somewhat less effective over time.  Previous negative testing for EPI.  No prior SIBO testing or treatment, and I preferred not to give her empiric therapy since she had a previous C. difficile infection.  EGD April 2023 for heartburn and dysphagia-normal exam.  Empiric 52 French bougie dilation performed.  Advised to continue antireflux diet and lifestyle measures as being at least as  important as medicines to control symptoms.  ________________ Same-day clinic cancellation for 11/13/2022 ***  ROS: Cardiovascular:  no chest pain Respiratory: no dyspnea  The patient's Past Medical, Family and Social History were reviewed and are on file in the EMR.  Objective:  Med list reviewed  Current Outpatient Medications:    metFORMIN (GLUCOPHAGE-XR) 500 MG 24 hr tablet, 2 tablets with evening meal Orally Once a day for 30 day(s), Disp: , Rfl:    Biotin 5000 MCG TABS, Take by mouth., Disp: , Rfl:    Coenzyme Q10 200 MG capsule, 1 capsule Orally once a day for 30 days, Disp: , Rfl:    docusate sodium (COLACE) 100 MG capsule, Take 1 capsule (100 mg total) by mouth 2 (two) times daily., Disp: 10 capsule, Rfl: 0   hyoscyamine (LEVSIN SL) 0.125 MG SL tablet, DISSOLVE 2 TABLETS IN MOUTH EVERY 6 HOURS AS NEEDED, Disp: 60 tablet, Rfl: 2   mesalamine (LIALDA) 1.2 g EC tablet, Take 1 tablet (1.2 g total) by mouth in the morning and at bedtime., Disp: 60 tablet, Rfl: 3   methocarbamol (ROBAXIN) 500 MG tablet, Take 1 tablet (500 mg total) by mouth every 6 (six) hours as needed for muscle spasms., Disp: 30 tablet, Rfl: 0   traMADol (ULTRAM) 50 MG tablet, TAKE 1 TABLET BY MOUTH EVERY 8 HOURS AS NEEDED, Disp: 30 tablet, Rfl: 0   Vibegron (GEMTESA) 75 MG TABS, Take 75 mg by mouth daily., Disp: , Rfl:    Vital signs in last 24 hrs: There were no vitals filed for this visit. Wt Readings from Last 3 Encounters:  07/18/22 175 lb (79.4 kg)  07/11/22 183 lb (83 kg)  05/21/21 189 lb (85.7 kg)    Physical Exam  *** HEENT: sclera anicteric, oral mucosa moist without lesions Neck: supple, no thyromegaly, JVD or lymphadenopathy Cardiac: ***,  no peripheral edema Pulm: clear to auscultation bilaterally, normal RR and effort noted Abdomen: soft, *** tenderness, with active bowel sounds. No guarding or palpable hepatosplenomegaly. Skin; warm and dry, no jaundice or  rash  Labs:   ___________________________________________ Radiologic studies:   ____________________________________________ Other:   _____________________________________________ Assessment & Plan  Assessment: No diagnosis found.    Plan:   *** minutes were spent on this encounter (including chart review, history/exam, counseling/coordination of care, and documentation) > 50% of that time was spent on counseling and coordination of care.   [name] III

## 2022-12-17 ENCOUNTER — Other Ambulatory Visit: Payer: Self-pay | Admitting: Gastroenterology

## 2022-12-30 ENCOUNTER — Telehealth: Payer: Self-pay | Admitting: Gastroenterology

## 2022-12-30 NOTE — Telephone Encounter (Signed)
Patient called and stated that she is really needing a refill on Hyoscyamine 0.125 MG and stated she does not have enough until her appointment 03/14/2023. Please Advise.

## 2022-12-31 ENCOUNTER — Other Ambulatory Visit: Payer: Self-pay

## 2022-12-31 MED ORDER — HYOSCYAMINE SULFATE 0.125 MG SL SUBL: 0.1250 mg | SUBLINGUAL_TABLET | SUBLINGUAL | 2 refills | Status: DC | PRN

## 2023-01-03 ENCOUNTER — Other Ambulatory Visit: Payer: Self-pay | Admitting: Gastroenterology

## 2023-01-25 ENCOUNTER — Other Ambulatory Visit: Payer: Self-pay | Admitting: Gastroenterology

## 2023-03-14 ENCOUNTER — Ambulatory Visit: Payer: Medicare Other | Admitting: Physician Assistant

## 2023-04-22 ENCOUNTER — Encounter: Payer: Self-pay | Admitting: Physician Assistant

## 2023-04-22 ENCOUNTER — Ambulatory Visit (INDEPENDENT_AMBULATORY_CARE_PROVIDER_SITE_OTHER): Payer: Medicare Other | Admitting: Physician Assistant

## 2023-04-22 VITALS — BP 124/80 | HR 85 | Ht 63.0 in | Wt 183.8 lb

## 2023-04-22 DIAGNOSIS — R1084 Generalized abdominal pain: Secondary | ICD-10-CM | POA: Diagnosis not present

## 2023-04-22 DIAGNOSIS — R159 Full incontinence of feces: Secondary | ICD-10-CM | POA: Diagnosis not present

## 2023-04-22 DIAGNOSIS — K580 Irritable bowel syndrome with diarrhea: Secondary | ICD-10-CM

## 2023-04-22 MED ORDER — HYOSCYAMINE SULFATE 0.125 MG SL SUBL: 0.2500 mg | SUBLINGUAL_TABLET | Freq: Two times a day (BID) | SUBLINGUAL | 1 refills | Status: DC

## 2023-04-22 MED ORDER — NA SULFATE-K SULFATE-MG SULF 17.5-3.13-1.6 GM/177ML PO SOLN: 1.0000 | Freq: Once | ORAL | 0 refills | Status: AC

## 2023-04-22 NOTE — Progress Notes (Signed)
 Chief Complaint: Chronic diarrhea, IBS and abdominal cramping  HPI:    Joann [name] is a 73 year old female, known to Dr. Myrtie Neither for IBS-D, who presents to clinic today for follow-up of her chronic diarrhea and IBS with exacerbation as well as abdominal cramping.    09/06/2016 colonoscopy with decreased finger tone internal hemorrhoids that prolapse with straining, diverticulosis in the sigmoid colon, 2 to-6 mm polyps in the distal ascending colon.  Repeat recommended in 7 years.    04/02/2021 office visit with Dr. Myrtie Neither had at that time discussed longstanding IBS with diarrhea.  Apparently she was taking a dietary supplement she bought online that completely resolved her symptoms but she ran out.  Had tried Viberzi once daily which helped a little but twice daily caused severe constipation.  At that point recommended EGD as below.  Apparently was occasionally taking some of her daughters Mesalamine which had helped before was no longer effective.  Previously tested negative for EPI.  She had previous C. difficile so empiric antibiotics for SIBO were not given.  Given hyoscyamine.    05/17/2021 EGD with normal esophagus dilated, normal stomach and normal duodenum.    Today, the patient tells me that all of her symptoms have gotten much worse.  She had a hemorrhoidectomy in 2019 and ever since then has lost muscle control it feels like.  She is having issues with her bladder dropping as well and is seeing urology about a possible bladder pacemaker.  She mentioned some of her fecal incontinence issues and they mentioned a rectal pacemaker as well.  Tells me she has been having increased diarrhea previously was only 2 out of 7 days a week and now its every day.  She will also have cramping in her lower abdomen every day that typically starts in the after noon and will continue all evening and sometimes wakes her up through the night.  She uses Pepto and Imodium if she is going to go out as well as the hyoscyamine  which sometimes works.  Other days she still has incontinence of stool.  When she takes her Hyoscyamine she is taking 3 tablets at once which causes some dry mouth.  She is quite frustrated with ongoing symptoms which are now inhibiting her lifestyle.    Denies fever, chills, weight loss, blood in her stool, nausea or vomiting.  Past Medical History:  Diagnosis Date   Cancer (HCC)    Skin Cancer on Nose   Chronic diarrhea    due to IBS   Dyslipidemia    Dysrhythmia    Palpitations   GERD (gastroesophageal reflux disease)    Hiatal hernia    Hx   History of palpitations    History of skin cancer    nose   IBS (irritable bowel syndrome)    OA (osteoarthritis)    knees and lower back   OAB (overactive bladder)    Prolapsed internal hemorrhoids, grade 3    Wears contact lenses    Wears dentures    UPPER    Past Surgical History:  Procedure Laterality Date   ABDOMINAL HYSTERECTOMY  1996    W/ BSO   BICEPT TENODESIS Left 07/18/2022   Procedure: BICEPS TENODESIS;  Surgeon: Joann Copa, MD;  Location: [facility] OR;  Service: Orthopedics;  Laterality: Left;   BREAST ENHANCEMENT SURGERY  1985   BUNIONECTOMY WITH HAMMERTOE RECONSTRUCTION Right 07-08-2014    @ [facility]   1st toe bunionectomy;  2nd toe hammertoe  correction   CARDIOVASCULAR STRESS TEST  12-20-2016    [facility] , care everywhere in epic   norma nuclear perfusion study w/ no ischemia/  normal LV function and wall motion , ef 71%   CATARACT EXTRACTION, BILATERAL     COLONOSCOPY  07/2011   [facility]    COLONOSCOPY  2019   EYE SURGERY Bilateral    Laser surgery to remove film after cataracts removed   FOOT SURGERY     2 bone spurs removed   FOOT SURGERY Left 2022   arthroscopy for arthritis   FOREIGN BODY REMOVAL Right 02/27/2016   Procedure: REMOVAL FOREIGN BODY EXTREMITY;  Surgeon: Joann Copa, MD;  Location: [facility] OR;  Service: Orthopedics;  Laterality: Right;   HEMORRHOID SURGERY N/A  09/05/2017   Procedure: TWO COLUMN HEMORRHOIDECTOMY, HEMROIDPEXY;  Surgeon: Romie Levee, MD;  Location: [facility];  Service: General;  Laterality: N/A;   KNEE ARTHROPLASTY  01/15/2011   Procedure: COMPUTER ASSISTED TOTAL KNEE ARTHROPLASTY;  Surgeon: Joann [name];  Location: [facility] OR;  Service: Orthopedics;  Laterality: Right;  Right total knee arthroplasty   KNEE ARTHROSCOPY Left 12/2009   MOHS SURGERY     NASAL SINUS SURGERY  2017   REVERSE SHOULDER ARTHROPLASTY Left 07/18/2022   Procedure: REVERSE SHOULDER ARTHROPLASTY;  Surgeon: Joann Copa, MD;  Location: [facility] OR;  Service: Orthopedics;  Laterality: Left;  RNFA APRIL PLEASE   TONSILLECTOMY  age 39   TOTAL KNEE ARTHROPLASTY Left 11-27-2010    dr dean   [facility]    Current Outpatient Medications  Medication Sig Dispense Refill   Biotin 5000 MCG TABS Take by mouth.     Coenzyme Q10 200 MG capsule 1 capsule Orally once a day for 30 days     mesalamine (LIALDA) 1.2 g EC tablet Take 1 tablet (1.2 g total) by mouth daily with breakfast. For next available refill please make a follow up appointment 60 tablet 0   Vibegron (GEMTESA) 75 MG TABS Take 75 mg by mouth daily.     hyoscyamine (LEVSIN SL) 0.125 MG SL tablet Take 1 tablet (0.125 mg total) by mouth every 4 (four) hours as needed. 60 tablet 2   No current facility-administered medications for this visit.    Allergies as of 04/22/2023   (No Known Allergies)    Family History  Problem Relation Age of Onset   Alzheimer's disease Mother    Colon cancer Neg Hx    Stomach cancer Neg Hx    Rectal cancer Neg Hx    Esophageal cancer Neg Hx    Liver cancer Neg Hx     Social History   Socioeconomic History   Marital status: Married    Spouse name: Not on file   Number of children: 3   Years of education: Not on file   Highest education level: Not on file  Occupational History   Occupation: court reporter-semi retired  Tobacco Use   Smoking status: Former     Current packs/day: 0.00    Average packs/day: 0.5 packs/day for 16.0 years (8.0 ttl pk-yrs)    Types: Cigarettes    Start date: 01/11/1975    Quit date: 01/11/1991    Years since quitting: 32.2   Smokeless tobacco: Never  Vaping Use   Vaping status: Never Used  Substance and Sexual Activity   Alcohol use: No   Drug use: No   Sexual activity: Yes    Birth control/protection: Surgical  Other Topics Concern  Not on file  Social History Narrative   3 step children   Social Drivers of Health   Financial Resource Strain: Low Risk  (09/05/2017)   Overall Financial Resource Strain (CARDIA)    Difficulty of Paying Living Expenses: Not hard at all  Food Insecurity: No Food Insecurity (07/18/2022)   Hunger Vital Sign    Worried About Running Out of Food in the Last Year: Never true    Ran Out of Food in the Last Year: Never true  Transportation Needs: No Transportation Needs (07/18/2022)   PRAPARE - Administrator, Civil Service (Medical): No    Lack of Transportation (Non-Medical): No  Physical Activity: Inactive (09/05/2017)   Exercise Vital Sign    Days of Exercise per Week: 0 days    Minutes of Exercise per Session: 0 min  Stress: No Stress Concern Present (09/05/2017)   [employer] of Occupational Health - Occupational Stress Questionnaire    Feeling of Stress : Only a little  Social Connections: Socially Integrated (09/05/2017)   Social Connection and Isolation Panel [NHANES]    Frequency of Communication with Friends and Family: More than three times a week    Frequency of Social Gatherings with Friends and Family: Not asked    Attends Religious Services: More than 4 times per year    Active Member of [employer] or Organizations: Yes    Attends Banker Meetings: 1 to 4 times per year    Marital Status: Married  Catering manager Violence: Not At Risk (09/05/2017)   Humiliation, Afraid, Rape, and Kick questionnaire    Fear of Current or Ex-Partner: No     Emotionally Abused: No    Physically Abused: No    Sexually Abused: No    Review of Systems:    Constitutional: No weight loss, fever or chills Cardiovascular: No chest pain  Respiratory: No SOB  Gastrointestinal: See HPI and otherwise negative   Physical Exam:  Vital signs: BP 124/80   Pulse 85   Ht 5\' 3"  (1.6 m)   Wt 183 lb 12.8 oz (83.4 kg)   BMI 32.56 kg/m    Constitutional:   Pleasant overweight Caucasian female appears to be in NAD, Well developed, Well nourished, alert and cooperative Head:  Normocephalic and atraumatic. Eyes:   PEERL, EOMI. No icterus. Conjunctiva pink. Ears:  Normal auditory acuity. Neck:  Supple Throat: Oral cavity and pharynx without inflammation, swelling or lesion.  Respiratory: Respirations even and unlabored. Lungs clear to auscultation bilaterally.   No wheezes, crackles, or rhonchi.  Cardiovascular: Normal S1, S2. No MRG. Regular rate and rhythm. No peripheral edema, cyanosis or pallor.  Gastrointestinal:  Soft, nondistended, nontender. No rebound or guarding. Normal bowel sounds. No appreciable masses or hepatomegaly. Rectal:  Not performed.  Msk:  Symmetrical without gross deformities. Without edema, no deformity or joint abnormality.  Neurologic:  Alert and  oriented x4;  grossly normal neurologically.  Skin:   Dry and intact without significant lesions or rashes. Psychiatric: Demonstrates good judgement and reason without abnormal affect or behaviors.  RELEVANT LABS AND IMAGING: CBC    Component Value Date/Time   WBC 7.3 07/11/2022 0910   RBC 4.87 07/11/2022 0910   HGB 14.1 07/11/2022 0910   HCT 43.3 07/11/2022 0910   PLT 273 07/11/2022 0910   MCV 88.9 07/11/2022 0910   MCH 29.0 07/11/2022 0910   MCHC 32.6 07/11/2022 0910   RDW 12.8 07/11/2022 0910   LYMPHSABS 2.9 11/19/2010 1554  MONOABS 0.6 11/19/2010 1554   EOSABS 0.3 11/19/2010 1554   BASOSABS 0.0 11/19/2010 1554    CMP     Component Value Date/Time   NA 138  07/11/2022 0910   K 4.0 07/11/2022 0910   CL 102 07/11/2022 0910   CO2 27 07/11/2022 0910   GLUCOSE 109 (H) 07/11/2022 0910   BUN 11 07/11/2022 0910   CREATININE 0.66 07/11/2022 0910   CALCIUM 9.9 07/11/2022 0910   GFRNONAA >60 07/11/2022 0910   GFRAA >90 01/16/2011 0612    Assessment: 1.  IBS-D: Chronic for the patient, some benefit from Viberzi in the past, not treated empirically for SIBO given history of C. difficile, colonoscopy is coming up due, will repeat now to ensure no microscopic colitis also review stool studies for possible infectious cause given history of C. difficile 2.  Generalized abdominal cramping: With above 3.  Fecal incontinence: Decreased sphincter tone in the past as well as grade 3 hemorrhoids likely contributing 4.  Screening for colorectal cancer: Patient is due in August  Plan: 1.  All of the patient's symptoms are worse over the past 3 to 4 months.  Symptoms are now daily.  Due to abrupt change in symptoms and exacerbation ordered stool studies today to include a GI pathogen panel, fecal calprotectin and fecal lactoferrin. 2.  Will also go ahead and schedule patient for her colonoscopy which is in due in August of this year for screening purposes, but with exacerbation of issues we will go ahead and do sooner.  Did provide the patient a detailed list of risks for the procedure and she agrees to proceed. Patient is appropriate for endoscopic procedure(s) in the ambulatory ([facility]) setting.  3.  Told patient to go ahead and schedule her Hyoscyamine 0.125 mg 1-2 tabs twice daily scheduled in the morning and then around 3 or 4:00 in the afternoon.  Prescribed 120 with 3 refills 4.  Patient is still on Mesalamine 1.2 g, it sounds like she used her daughter's at first which helped and now she is on this prescription daily, not exactly sure the indication, though possibly this could be increased in the future if helpful.  Could also possibly try cholestyramine. 5.  Patient  is quite frustrated with ongoing symptoms.  May also benefit from pelvic floor therapy. 6.  Patient to follow in clinic per recommendations after time of labs and colonoscopy as above.  Hyacinth Meeker, PA-C [facility] 04/22/2023, 9:34 AM

## 2023-04-22 NOTE — Patient Instructions (Signed)
 Your provider has requested that you go to the basement level for lab work before leaving today. Press "B" on the elevator. The lab is located at the first door on the left as you exit the elevator.  We have sent the following medications to your pharmacy for you to pick up at your convenience: Hyoscyamine 0.125 mg SL take 2 tablets once in the morning and at 3 pm.   You have been scheduled for a colonoscopy. Please follow written instructions given to you at your visit today.   If you use inhalers (even only as needed), please bring them with you on the day of your procedure.  DO NOT TAKE 7 DAYS PRIOR TO TEST- Trulicity (dulaglutide) Ozempic, Wegovy (semaglutide) Mounjaro (tirzepatide) Bydureon Bcise (exanatide extended release)  DO NOT TAKE 1 DAY PRIOR TO YOUR TEST Rybelsus (semaglutide) Adlyxin (lixisenatide) Victoza (liraglutide) Byetta (exanatide)  _______________________________________________________  If your blood pressure at your visit was 140/90 or greater, please contact your primary care physician to follow up on this.  _______________________________________________________  If you are age 75 or older, your body mass index should be between 23-30. Your Body mass index is 32.56 kg/m. If this is out of the aforementioned range listed, please consider follow up with your Primary Care Provider.  If you are age 1 or younger, your body mass index should be between 19-25. Your Body mass index is 32.56 kg/m. If this is out of the aformentioned range listed, please consider follow up with your Primary Care Provider.   ________________________________________________________  The [facility] providers would like to encourage you to use [facility] to communicate with providers for non-urgent requests or questions.  Due to long hold times on the telephone, sending your provider a message by [facility] may be a faster and more efficient way to get a response.  Please allow 48 business  hours for a response.  Please remember that this is for non-urgent requests.  _______________________________________________________

## 2023-04-22 NOTE — Progress Notes (Signed)
 ____________________________________________________________  Attending physician addendum:  Thank you for sending this case to me. I have reviewed the entire note and agree with the plan.  While I agree with a colonoscopy due to her reported worsening of the diarrhea, to be clear she has not previously had a diagnosis of microscopic colitis.  I have suspected that she may have had placebo effect from taking one of her daughters mesalamine tablets since she would report just tablet would significantly improve her diarrhea when no microscopic colitis was present on biopsies. Yes, she should be ruled out for C. difficile again as well. 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 Amada Jupiter, MD  ____________________________________________________________

## 2023-05-08 ENCOUNTER — Other Ambulatory Visit

## 2023-05-08 DIAGNOSIS — R159 Full incontinence of feces: Secondary | ICD-10-CM

## 2023-05-08 DIAGNOSIS — K580 Irritable bowel syndrome with diarrhea: Secondary | ICD-10-CM

## 2023-05-08 DIAGNOSIS — R1084 Generalized abdominal pain: Secondary | ICD-10-CM

## 2023-05-09 LAB — FECAL LACTOFERRIN, QUANT
Fecal Lactoferrin: NEGATIVE
MICRO NUMBER:: 16314079
SPECIMEN QUALITY:: ADEQUATE

## 2023-05-10 LAB — GI PROFILE, STOOL, PCR

## 2023-05-10 LAB — CALPROTECTIN, FECAL: Calprotectin, Fecal: 81 ug/g (ref 0–120)

## 2023-05-14 ENCOUNTER — Encounter: Payer: Self-pay | Admitting: Gastroenterology

## 2023-05-20 ENCOUNTER — Encounter: Payer: Self-pay | Admitting: Certified Registered Nurse Anesthetist

## 2023-05-22 ENCOUNTER — Ambulatory Visit: Admitting: Gastroenterology

## 2023-05-22 ENCOUNTER — Encounter: Payer: Self-pay | Admitting: Gastroenterology

## 2023-05-22 VITALS — BP 126/78 | HR 73 | Temp 97.1°F | Resp 14 | Ht 63.0 in | Wt 183.0 lb

## 2023-05-22 DIAGNOSIS — K5730 Diverticulosis of large intestine without perforation or abscess without bleeding: Secondary | ICD-10-CM

## 2023-05-22 DIAGNOSIS — R1084 Generalized abdominal pain: Secondary | ICD-10-CM | POA: Diagnosis not present

## 2023-05-22 DIAGNOSIS — K580 Irritable bowel syndrome with diarrhea: Secondary | ICD-10-CM

## 2023-05-22 DIAGNOSIS — R159 Full incontinence of feces: Secondary | ICD-10-CM

## 2023-05-22 DIAGNOSIS — K529 Noninfective gastroenteritis and colitis, unspecified: Secondary | ICD-10-CM

## 2023-05-22 DIAGNOSIS — K6289 Other specified diseases of anus and rectum: Secondary | ICD-10-CM | POA: Diagnosis not present

## 2023-05-22 MED ORDER — SODIUM CHLORIDE 0.9 % IV SOLN: 500.0000 mL | Freq: Once | INTRAVENOUS | Status: DC

## 2023-05-22 NOTE — Patient Instructions (Addendum)
Handouts Provided:  Diverticulosis  YOU HAD AN ENDOSCOPIC PROCEDURE TODAY AT THE [facility]:   Refer to the procedure report that was given to you for any specific questions about what was found during the examination.  If the procedure report does not answer your questions, please call your gastroenterologist to clarify.  If you requested that your care partner not be given the details of your procedure findings, then the procedure report has been included in a sealed envelope for you to review at your convenience later.  YOU SHOULD EXPECT: Some feelings of bloating in the abdomen. Passage of more gas than usual.  Walking can help get rid of the air that was put into your GI tract during the procedure and reduce the bloating. If you had a lower endoscopy (such as a colonoscopy or flexible sigmoidoscopy) you may notice spotting of blood in your stool or on the toilet paper. If you underwent a bowel prep for your procedure, you may not have a normal bowel movement for a few days.  Please Note:  You might notice some irritation and congestion in your nose or some drainage.  This is from the oxygen used during your procedure.  There is no need for concern and it should clear up in a day or so.  SYMPTOMS TO REPORT IMMEDIATELY:  Following lower endoscopy (colonoscopy or flexible sigmoidoscopy):  Excessive amounts of blood in the stool  Significant tenderness or worsening of abdominal pains  Swelling of the abdomen that is new, acute  Fever of 100F or higher  For urgent or emergent issues, a gastroenterologist can be reached at any hour by calling [phone]. Do not use MyChart messaging for urgent concerns.    DIET:  We do recommend a small meal at first, but then you may proceed to your regular diet.  Drink plenty of fluids but you should avoid alcoholic beverages for 24 hours.  ACTIVITY:  You should plan to take it easy for the rest of today and you should NOT DRIVE or use heavy  machinery until tomorrow (because of the sedation medicines used during the test).    FOLLOW UP: Our staff will call the number listed on your records the next business day following your procedure.  We will call around 7:15- 8:00 am to check on you and address any questions or concerns that you may have regarding the information given to you following your procedure. If we do not reach you, we will leave a message.     If any biopsies were taken you will be contacted by phone or by letter within the next 1-3 weeks.  Please call us at [phone] if you have not heard about the biopsies in 3 weeks.    SIGNATURES/CONFIDENTIALITY: You and/or your care partner have signed paperwork which will be entered into your electronic medical record.  These signatures attest to the fact that that the information above on your After Visit Summary has been reviewed and is understood.  Full responsibility of the confidentiality of this discharge information lies with you and/or your care-partner.  

## 2023-05-22 NOTE — Progress Notes (Signed)
 Report given to PACU, vss

## 2023-05-22 NOTE — Op Note (Signed)
 [facility] Patient Name: Joann [name] Procedure Date: 05/22/2023 7:07 AM MRN: [id number] Endoscopist: Ace Abu L. Dominic Friendly , MD, [id number] Age: 73 Referring MD:  Date of Birth: 1950/07/01 Gender: Female Account #: [id number] Procedure:                Colonoscopy Indications:              Generalized abdominal pain, Chronic diarrhea, Fecal                            incontinence (with concomitant bladder symptoms                            evaluated by urology)                           Clinical details in 04/22/2023 office consult note                           Carries diagnosis of IBS-D. C. difficile positive                            in 2022, recent stool test negative. Previously                            tested negative for celiac antibody, microscopic                            colitis and EPI. No prior testing or treatment for                            SIBO                           Variable relief with hyoscyamine . Previous trial of                            Viberzi  caused constipation. Medicines:                Monitored Anesthesia Care Procedure:                Pre-Anesthesia Assessment:                           - Prior to the procedure, a History and Physical                            was performed, and patient medications and                            allergies were reviewed. The patient's tolerance of                            previous anesthesia was also reviewed. The risks                            and benefits of the procedure and  the sedation                            options and risks were discussed with the patient.                            All questions were answered, and informed consent                            was obtained. Prior Anticoagulants: The patient has                            taken no anticoagulant or antiplatelet agents. ASA                            Grade Assessment: II - A patient with mild systemic                             disease. After reviewing the risks and benefits,                            the patient was deemed in satisfactory condition to                            undergo the procedure.                           After obtaining informed consent, the colonoscope                            was passed under direct vision. Throughout the                            procedure, the patient's blood pressure, pulse, and                            oxygen saturations were monitored continuously. The                            Olympus Scope SN: [id number] was introduced through                            the anus and advanced to the the terminal ileum,                            with identification of the appendiceal orifice and                            IC valve. The colonoscopy was somewhat difficult                            due to a tortuous rectosigmoid colon. The patient  tolerated the procedure well. The quality of the                            bowel preparation was good. The terminal ileum,                            ileocecal valve, appendiceal orifice, and rectum                            were photographed. Scope In: 8:16:16 AM Scope Out: 8:29:29 AM Scope Withdrawal Time: 0 hours 8 minutes 15 seconds  Total Procedure Duration: 0 hours 13 minutes 13 seconds  Findings:                 The digital rectal exam findings include decreased                            sphincter tone.                           The terminal ileum appeared normal.                           Repeat examination of right colon under NBI                            performed.                           Normal mucosa was found in the entire colon.                            Biopsies for histology were taken with a cold                            forceps from the ascending colon, transverse colon                            and sigmoid colon for evaluation of microscopic                            colitis.                            Multiple diverticula were found in the sigmoid                            colon.                           The exam was otherwise without abnormality on                            direct and retroflexion views. Complications:            No immediate complications. Estimated Blood Loss:     Estimated blood loss was minimal. Impression:               -  Decreased sphincter tone found on digital rectal                            exam.                           - The examined portion of the ileum was normal.                           - Normal mucosa in the entire examined colon.                            Biopsied.                           - Diverticulosis in the sigmoid colon.                           - The examination was otherwise normal on direct                            and retroflexion views. Recommendation:           - Patient has a contact number available for                            emergencies. The signs and symptoms of potential                            delayed complications were discussed with the                            patient. Return to normal activities tomorrow.                            Written discharge instructions were provided to the                            patient.                           - Resume previous diet.                           - Continue present medications.                           - Await pathology results.                           - No repeat routine screening colonoscopy due to                            age, current guidelines and low risk findings today.                           - If biopsies negative for microscopic  colitis                            again, recommendation will be to discontinue                            mesalamine  and prescribe an empiric trial of                            rifaximin 550 mg 3 times daily for 14 days (if can                            obtain insurance approval). Second line  empiric                            therapy has been avoided in the past due to history                            of C. difficile.                           - Reestablish care with colorectal surgery (who                            performed previous hemorrhoidectomy) for                            consideration of available therapies for fecal                            incontinence. Ideally, your urologist would                            communicate with that surgeon for discussion of                            your pelvic floor issues. Skyler Dusing L. Dominic Friendly, MD 05/22/2023 8:40:09 AM This report has been signed electronically.

## 2023-05-22 NOTE — Progress Notes (Signed)
 No significant changes to clinical history since GI office visit on 04/22/23.  The patient is appropriate for an endoscopic procedure in the ambulatory setting.  - Lorella Roles, MD

## 2023-05-22 NOTE — Progress Notes (Signed)
 Pt's states no medical or surgical changes since previsit or office visit.

## 2023-05-22 NOTE — Progress Notes (Signed)
 Called to room to assist during endoscopic procedure.  Patient ID and intended procedure confirmed with present staff. Received instructions for my participation in the procedure from the performing physician.

## 2023-05-23 ENCOUNTER — Telehealth: Payer: Self-pay

## 2023-05-23 NOTE — Telephone Encounter (Signed)
  Follow up Call-     05/22/2023    7:14 AM 05/21/2021   10:51 AM  Call back number  Post procedure Call Back phone  # [phone]  Permission to leave phone message Yes Yes     Patient questions:  Do you have a fever, pain , or abdominal swelling? No. Pain Score  0 *  Have you tolerated food without any problems? Yes.    Have you been able to return to your normal activities? Yes.    Do you have any questions about your discharge instructions: Diet   No. Medications  No. Follow up visit  No.  Do you have questions or concerns about your Care? No.  Actions: * If pain score is 4 or above: No action needed, pain <4.

## 2023-05-26 LAB — SURGICAL PATHOLOGY

## 2023-05-27 ENCOUNTER — Encounter: Payer: Self-pay | Admitting: Gastroenterology

## 2023-05-28 ENCOUNTER — Other Ambulatory Visit: Payer: Self-pay

## 2023-05-28 ENCOUNTER — Telehealth: Payer: Self-pay | Admitting: Gastroenterology

## 2023-05-28 MED ORDER — RIFAXIMIN 550 MG PO TABS: 550.0000 mg | ORAL_TABLET | Freq: Three times a day (TID) | ORAL | 0 refills | Status: AC

## 2023-05-28 NOTE — Telephone Encounter (Signed)
 Inbound call from patient, returning patty's call in regards to procedure results.

## 2023-05-28 NOTE — Telephone Encounter (Signed)
 See results note.

## 2023-06-04 ENCOUNTER — Other Ambulatory Visit (INDEPENDENT_AMBULATORY_CARE_PROVIDER_SITE_OTHER): Payer: Self-pay

## 2023-06-04 ENCOUNTER — Encounter: Payer: Self-pay | Admitting: Orthopedic Surgery

## 2023-06-04 ENCOUNTER — Ambulatory Visit: Admitting: Orthopedic Surgery

## 2023-06-04 ENCOUNTER — Encounter: Payer: Self-pay | Admitting: Podiatry

## 2023-06-04 ENCOUNTER — Ambulatory Visit (INDEPENDENT_AMBULATORY_CARE_PROVIDER_SITE_OTHER)

## 2023-06-04 ENCOUNTER — Ambulatory Visit (INDEPENDENT_AMBULATORY_CARE_PROVIDER_SITE_OTHER): Payer: Self-pay | Admitting: Podiatry

## 2023-06-04 VITALS — Ht 63.0 in | Wt 183.0 lb

## 2023-06-04 DIAGNOSIS — M19072 Primary osteoarthritis, left ankle and foot: Secondary | ICD-10-CM | POA: Diagnosis not present

## 2023-06-04 DIAGNOSIS — M7752 Other enthesopathy of left foot: Secondary | ICD-10-CM

## 2023-06-04 DIAGNOSIS — M25561 Pain in right knee: Secondary | ICD-10-CM | POA: Diagnosis not present

## 2023-06-04 DIAGNOSIS — G8929 Other chronic pain: Secondary | ICD-10-CM

## 2023-06-04 NOTE — Progress Notes (Signed)
 Office Visit Note   Patient: Joann [name]           Date of Birth: [birth year]           MRN: [id number] Visit Date: 06/04/2023 Requested by: No referring provider defined for this encounter. PCP: Pcp, No  Subjective: Chief Complaint  Patient presents with   Right Knee - Pain    HPI: Joann [name] is a 73 y.o. female who presents to the office reporting right knee pain several months duration.  She had a total knee replacement done about 13 years ago.  Feels like there is a band and tightness around that right knee region.  Does have some back pain which is chronically getting worse.  Denies much in the way of numbness and tingling going down the leg.  Denies any groin pain.  On 1 episode patient had difficulty bending the knee.  Denies any fevers or chills..                ROS: All systems reviewed are negative as they relate to the chief complaint within the history of present illness.  Patient denies fevers or chills.  Assessment & Plan: Visit Diagnoses:  1. Chronic pain of right knee     Plan: Impression is right knee pain which could be synovitis from early particle wear although there is no definite effusion in the knee.  Radiographs show no loose body or evidence of loosening of the prosthesis.  Plan at this time is observation.  We will try some tramadol  for pain as needed.  She does have irritable bowel syndrome and cannot really take anti-inflammatories.  Medrol  Dosepak would be the next medication to try if the tramadol  does not help.  She will follow-up with us  as needed.  Follow-Up Instructions: No follow-ups on file.   Orders:  Orders Placed This Encounter  Procedures   XR KNEE 3 VIEW RIGHT   No orders of the defined types were placed in this encounter.     Procedures: No procedures performed   Clinical Data: No additional findings.  Objective: Vital Signs: There were no vitals taken for this visit.  Physical Exam:  Constitutional: Patient appears  well-developed HEENT:  Head: Normocephalic Eyes:EOM are normal Neck: Normal range of motion Cardiovascular: Normal rate Pulmonary/chest: Effort normal Neurologic: Patient is alert Skin: Skin is warm Psychiatric: Patient has normal mood and affect  Ortho Exam: Ortho exam demonstrates normal gait and alignment.  Has very good range of motion in both knees from about 0-1 15 of the 120 of flexion.  No effusion in either knee.  Slightly more synovitis on the right-hand side than the left but there is no warmth.  Patella has excellent mobility without apprehension in both knees.  Mild crepitus slightly more on the right compared to the left.  Collaterals are stable to varus valgus stress at 0 and 30 degrees on the right-hand side.  Specialty Comments:  CLINICAL DATA:  Left shoulder and neck pain.  Left arm pain.   EXAM: MRI CERVICAL SPINE WITHOUT CONTRAST   TECHNIQUE: Multiplanar, multisequence MR imaging of the cervical spine was performed. No intravenous contrast was administered.   COMPARISON:  None Available.   FINDINGS: Alignment: Physiologic.   Vertebrae: No acute fracture, evidence of discitis, or aggressive bone lesion.   Cord: Normal signal and morphology.   Posterior Fossa, vertebral arteries, paraspinal tissues: Posterior fossa demonstrates no focal abnormality. Vertebral artery flow voids are maintained. Paraspinal soft tissues are  unremarkable.   Disc levels:   Discs: Degenerative disease with mild disc height loss at C5-6 and C6-7.   C2-3: No disc protrusion. Moderate left facet arthropathy. Mild left foraminal stenosis. No right foraminal stenosis. No spinal stenosis.   C3-4: No disc protrusion. Severe left facet arthropathy. Moderate-severe left foraminal stenosis. No right foraminal stenosis. No spinal stenosis.   C4-5: Moderate left facet arthropathy. Mild left foraminal stenosis. No right foraminal stenosis. No spinal stenosis.   C5-6: Mild broad-based  disc bulge. Mild bilateral facet arthropathy. Mild-moderate right foraminal stenosis. No left foraminal stenosis. No spinal stenosis.   C6-7: Mild broad-based disc bulge. Bilateral uncovertebral degenerative changes. Mild bilateral foraminal stenosis.   C7-T1: No significant disc bulge. No neural foraminal stenosis. No central canal stenosis.   IMPRESSION: 1. Diffuse cervical spine spondylosis as described above, more severe on the left. 2. No acute osseous injury of the cervical spine.     Electronically Signed   By: Onnie Bilis M.D.   On: 05/13/2022 10:25  Imaging: XR KNEE 3 VIEW RIGHT Result Date: 06/04/2023 AP lateral merchant radiographs right knee reviewed.  Posterior cruciate sacrificing total knee replacement in good position alignment.  No lucencies at the bone cement interface.  No change compared to prior radiographs 3 years ago patella well centered in the trochlear groove  DG Foot Complete Left Result Date: 06/04/2023 Please see detailed radiograph report in office note.  DG Ankle 2 Views Left Result Date: 06/04/2023 Please see detailed radiograph report in office note.    PMFS History: Patient Active Problem List   Diagnosis Date Noted   Biceps tendonitis on left 07/28/2022   Shoulder arthritis 07/18/2022   Right upper quadrant pain 08/21/2017   Nausea without vomiting 08/21/2017   Hyperlipidemia 01/01/2017   Pre-diabetes 01/01/2017   Shortness of breath 01/01/2017   Palpitations 12/22/2016   Chest pain 12/19/2016   Hemorrhoids 12/19/2016   Past Medical History:  Diagnosis Date   Cancer (HCC)    Skin Cancer on Nose   Chronic diarrhea    due to IBS   Dyslipidemia    Dysrhythmia    Palpitations   GERD (gastroesophageal reflux disease)    Hiatal hernia    Hx   History of palpitations    History of skin cancer    nose   IBS (irritable bowel syndrome)    OA (osteoarthritis)    knees and lower back   OAB (overactive bladder)    Prolapsed internal  hemorrhoids, grade 3    Wears contact lenses    Wears dentures    UPPER    Family History  Problem Relation Age of Onset   Alzheimer's disease Mother    Colon cancer Neg Hx    Stomach cancer Neg Hx    Rectal cancer Neg Hx    Esophageal cancer Neg Hx    Liver cancer Neg Hx     Past Surgical History:  Procedure Laterality Date   ABDOMINAL HYSTERECTOMY  1996    W/ BSO   BICEPT TENODESIS Left 07/18/2022   Procedure: BICEPS TENODESIS;  Surgeon: Jasmine Mesi, MD;  Location: [facility] OR;  Service: Orthopedics;  Laterality: Left;   BREAST ENHANCEMENT SURGERY  1985   BUNIONECTOMY WITH HAMMERTOE RECONSTRUCTION Right 07-08-2014    @ [facility]   1st toe bunionectomy;  2nd toe hammertoe correction   CARDIOVASCULAR STRESS TEST  12-20-2016    [facility] , care everywhere in epic   norma nuclear perfusion  study w/ no ischemia/  normal LV function and wall motion , ef 71%   CATARACT EXTRACTION, BILATERAL     COLONOSCOPY  07/2011   [facility]    COLONOSCOPY  2019   EYE SURGERY Bilateral    Laser surgery to remove film after cataracts removed   FOOT SURGERY     2 bone spurs removed   FOOT SURGERY Left 2022   arthroscopy for arthritis   FOREIGN BODY REMOVAL Right 02/27/2016   Procedure: REMOVAL FOREIGN BODY EXTREMITY;  Surgeon: Jasmine Mesi, MD;  Location: [facility] OR;  Service: Orthopedics;  Laterality: Right;   HEMORRHOID SURGERY N/A 09/05/2017   Procedure: TWO COLUMN HEMORRHOIDECTOMY, HEMROIDPEXY;  Surgeon: Joyce Nixon, MD;  Location[facility];  Service: General;  Laterality: N/A;   KNEE ARTHROPLASTY  01/15/2011   Procedure: COMPUTER ASSISTED TOTAL KNEE ARTHROPLASTY;  Surgeon: Jasmine Mesi;  Location: [facility] OR;  Service: Orthopedics;  Laterality: Right;  Right total knee arthroplasty   KNEE ARTHROSCOPY Left 12/2009   MOHS SURGERY     NASAL SINUS SURGERY  2017   REVERSE SHOULDER ARTHROPLASTY Left 07/18/2022   Procedure: REVERSE SHOULDER  ARTHROPLASTY;  Surgeon: Jasmine Mesi, MD;  Location: [facility] OR;  Service: Orthopedics;  Laterality: Left;  RNFA APRIL PLEASE   TONSILLECTOMY  age 33   TOTAL KNEE ARTHROPLASTY Left 11-27-2010    dr Darragh Nay   [facility]   Social History   Occupational History   Occupation: court reporter-semi retired  Tobacco Use   Smoking status: Former    Current packs/day: 0.00    Average packs/day: 0.5 packs/day for 16.0 years (8.0 ttl pk-yrs)    Types: Cigarettes    Start date: 01/11/1975    Quit date: 01/11/1991    Years since quitting: 32.4   Smokeless tobacco: Never  Vaping Use   Vaping status: Never Used  Substance and Sexual Activity   Alcohol use: No   Drug use: No   Sexual activity: Yes    Birth control/protection: Surgical    Comment: Complete hysterectomy

## 2023-06-04 NOTE — Progress Notes (Signed)
 Chief Complaint  Patient presents with   Foot Pain    left foot had surgery on it bothering pt again very painful ankle and side area    HPI: 73 y.o. female presenting today for evaluation of left foot and ankle pain.  History of left ankle arthroscopy. DOS: 12/07/2020.  Past Medical History:  Diagnosis Date   Cancer (HCC)    Skin Cancer on Nose   Chronic diarrhea    due to IBS   Dyslipidemia    Dysrhythmia    Palpitations   GERD (gastroesophageal reflux disease)    Hiatal hernia    Hx   History of palpitations    History of skin cancer    nose   IBS (irritable bowel syndrome)    OA (osteoarthritis)    knees and lower back   OAB (overactive bladder)    Prolapsed internal hemorrhoids, grade 3    Wears contact lenses    Wears dentures    UPPER    Past Surgical History:  Procedure Laterality Date   ABDOMINAL HYSTERECTOMY  1996    W/ BSO   BICEPT TENODESIS Left 07/18/2022   Procedure: BICEPS TENODESIS;  Surgeon: Jasmine Mesi, MD;  Location: [facility] OR;  Service: Orthopedics;  Laterality: Left;   BREAST ENHANCEMENT SURGERY  1985   BUNIONECTOMY WITH HAMMERTOE RECONSTRUCTION Right 07-08-2014    @ [facility]   1st toe bunionectomy;  2nd toe hammertoe correction   CARDIOVASCULAR STRESS TEST  12-20-2016    [facility] , care everywhere in epic   norma nuclear perfusion study w/ no ischemia/  normal LV function and wall motion , ef 71%   CATARACT EXTRACTION, BILATERAL     COLONOSCOPY  07/2011   [facility]    COLONOSCOPY  2019   EYE SURGERY Bilateral    Laser surgery to remove film after cataracts removed   FOOT SURGERY     2 bone spurs removed   FOOT SURGERY Left 2022   arthroscopy for arthritis   FOREIGN BODY REMOVAL Right 02/27/2016   Procedure: REMOVAL FOREIGN BODY EXTREMITY;  Surgeon: Jasmine Mesi, MD;  Location: [facility] OR;  Service: Orthopedics;  Laterality: Right;   HEMORRHOID SURGERY N/A 09/05/2017   Procedure: TWO COLUMN HEMORRHOIDECTOMY,  HEMROIDPEXY;  Surgeon: Joyce Nixon, MD;  Location: [facility];  Service: General;  Laterality: N/A;   KNEE ARTHROPLASTY  01/15/2011   Procedure: COMPUTER ASSISTED TOTAL KNEE ARTHROPLASTY;  Surgeon: Jasmine Mesi;  Location: [facility] OR;  Service: Orthopedics;  Laterality: Right;  Right total knee arthroplasty   KNEE ARTHROSCOPY Left 12/2009   MOHS SURGERY     NASAL SINUS SURGERY  2017   REVERSE SHOULDER ARTHROPLASTY Left 07/18/2022   Procedure: REVERSE SHOULDER ARTHROPLASTY;  Surgeon: Jasmine Mesi, MD;  Location: [facility] OR;  Service: Orthopedics;  Laterality: Left;  RNFA APRIL PLEASE   TONSILLECTOMY  age 71   TOTAL KNEE ARTHROPLASTY Left 11-27-2010    dr dean   [facility]    No Known Allergies   Physical Exam: General: The patient is alert and oriented x3 in no acute distress.  Dermatology: Skin is warm, dry and supple bilateral lower extremities.   Vascular: Palpable pedal pulses bilaterally. Capillary refill within normal limits.  No appreciable edema.  No erythema.  Neurological: Grossly intact via light touch  Musculoskeletal Exam: No pedal deformities noted.  Tenderness with palpation noted diffusely throughout the left ankle  Radiographic Exam LT foot and ankle 06/04/2023:  Normal osseous mineralization. Joint spaces preserved.  No fractures or osseous irregularities noted.  Impression: Negative  MR ANKLE LEFT WO CONTRAST 08/11/2019 IMPRESSION: Osteochondral lesion of the medial talar dome with marked thinning of overlying hyaline cartilage. Marrow edema deep to the lesion extends into the neck of the talus and is consistent with secondary stress change. No fracture is identified. 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  Assessment/Plan of Care: 1. PSxHx left ankle arthroscopy. DOS: 12/07/2020 2.  Arthritis/capsulitis left ankle 3.  Osteochondral lesion medial talar dome 4.  PSxHx: b/l knee  replacements: 2021. RT foot bunionectomy x3: 1980s, 2015. RT flatfoot reconstructive surgery: 2010  5.  First MTP capsulitis left   -Patient evaluated.  X-rays reviewed -For now pursue conservative treatment and therapy. -Patient also experiencing knee pain.  Following up with Ortho -Recommend good supportive tennis shoes and sneakers that do not irritate or constrict the toebox area -Return to clinic as needed   Dot Gazella, DPM [facility]  Dr. Dot Gazella, DPM    [address] [place], [place] [address]                Office [phone]  Fax [phone]

## 2023-06-05 ENCOUNTER — Telehealth: Payer: Self-pay | Admitting: Orthopedic Surgery

## 2023-06-05 NOTE — Telephone Encounter (Signed)
 Patient called and said that she was suppose to get  some Tramadol  sent to the [facility] in [place]. CB#[phone]

## 2023-06-06 ENCOUNTER — Other Ambulatory Visit: Payer: Self-pay | Admitting: Surgical

## 2023-06-06 MED ORDER — TRAMADOL HCL 50 MG PO TABS: 50.0000 mg | ORAL_TABLET | Freq: Every day | ORAL | 0 refills | Status: AC | PRN

## 2023-06-06 NOTE — Telephone Encounter (Signed)
 Sent in

## 2023-06-18 ENCOUNTER — Telehealth: Payer: Self-pay

## 2023-06-18 ENCOUNTER — Other Ambulatory Visit (HOSPITAL_COMMUNITY): Payer: Self-pay

## 2023-06-18 NOTE — Telephone Encounter (Signed)
 Pharmacy Patient Advocate Encounter   Received notification from Patient Inurance that prior authorization for rifaximin  (XIFAXAN ) 550 MG TABS tablet is required/requested.   Insurance verification completed.   The patient is insured through [employer] .   Per test claim: The current 10 day co-pay is, $640.48*.  No PA needed at this time. This test claim was processed through [facility]- copay amounts may vary at other pharmacies due to pharmacy/plan contracts, or as the patient moves through the different stages of their insurance plan.    *The patient is likely to benefit from the [facility] Prescription Payment Plan Program.  Patient can contact insurance for further information and to sign up.

## 2023-08-11 ENCOUNTER — Telehealth: Payer: Self-pay | Admitting: Physical Medicine and Rehabilitation

## 2023-08-11 NOTE — Telephone Encounter (Signed)
 Pt called requesting an appt with [facility] for neck pains. Last appt 05/24/22.Pt phone number I [phone]

## 2023-08-26 ENCOUNTER — Ambulatory Visit: Admitting: Physical Medicine and Rehabilitation

## 2023-08-30 ENCOUNTER — Encounter: Payer: Self-pay | Admitting: Gastroenterology

## 2023-09-09 ENCOUNTER — Encounter: Payer: Self-pay | Admitting: Physical Medicine and Rehabilitation

## 2023-09-09 ENCOUNTER — Ambulatory Visit: Admitting: Physical Medicine and Rehabilitation

## 2023-09-09 DIAGNOSIS — M542 Cervicalgia: Secondary | ICD-10-CM

## 2023-09-09 DIAGNOSIS — M7918 Myalgia, other site: Secondary | ICD-10-CM

## 2023-09-09 NOTE — Progress Notes (Signed)
 Pain Scale   Average Pain 4 Patient advising she has chronic neck pain and after a fall in May of 2025 she has been having stiffness and pain in her neck, the pain is centralized to just neck area.        +Driver, -BT, -Dye Allergies.

## 2023-09-09 NOTE — Progress Notes (Signed)
 Joann [name] - 73 y.o. female MRN [id number]  Date of birth: 09/09/1950  Office Visit Note: Visit Date: 09/09/2023 PCP: Pcp, No Referred by: No ref. provider found  Subjective: Chief Complaint  Patient presents with   Neck - Pain   HPI: Joann [name] is a 73 y.o. female who comes in today for evaluation of chronic bilateral neck pain, left worse than right. Pain started after mechanical fall at wedding in May. States she tripped and fell on her tailbone. Her pain does radiate up to head, also reports intermittent headaches. Her pain worsens with activity. She describes pain as sore and stiff sensation, currently rates as 4 out of 10. Some relief of pain with home exercise regimen, rest and use of medications. Some relief of pain with Tramadol  and Baclofen. No recent formal physical therapy. Reports history of dry needling many years ago that was very helpful in alleviating her pain. Cervical MRI imaging from 2024 shows moderate left facet arthropathy at C2-C3, severe on the left at C3-C4. No significant nerve impingement, no severe spinal canal stenosis noted. She underwent left C7-T1 interlaminar epidural steroid injection in our office on 05/24/2022. No relief of pain with this procedure. Patient denies focal weakness, numbness and tingling. No recent trauma or falls.   She is managed by Dr. Glendia Hutchinson from orthopedic standpoint, history of left reverse shoulder arthroplasty with Dr. Hutchinson in June of 2024.       Review of Systems  Musculoskeletal:  Positive for myalgias and neck pain.  Neurological:  Negative for tingling, sensory change, focal weakness and weakness.  All other systems reviewed and are negative.  Otherwise per HPI.  Assessment & Plan: Visit Diagnoses:    ICD-10-CM   1. Cervicalgia  M54.2 Ambulatory referral to Physical Therapy    2. Myofascial pain syndrome  M79.18 Ambulatory referral to Physical Therapy       Plan: Findings:  Chronic bilateral neck pain, left  worse than right. No radicular symptoms radiating down the arms. Patient continues to have severe pain despite good conservative therapies such as physical therapy/dry needling, home exercise regimen, rest and use of medications. Patients clinical presentation and exam are complex, differentials include myofascial pain syndrome vs facet mediated pain. Myofascial tenderness noted to bilateral levator scapulae and trapezius regions today. She also has pain with side to side rotation of her neck, specifically rotation to the left. We discussed treatment plan in detail today, next step is to place order for short course of physical therapy.dry needling. She would like to have PT in [place], [place]. I also instructed her to try topical Voltaren  gel up to 3 times a day. I would like to see her back for re-evaluation following PT.  Should her pain persist we would consider performing cervical facet joint injections. Patient has no questions at this time. No red flag symptoms noted upon exam today.      Meds & Orders: No orders of the defined types were placed in this encounter.   Orders Placed This Encounter  Procedures   Ambulatory referral to Physical Therapy    Follow-up: Return if symptoms worsen or fail to improve.   Procedures: No procedures performed      Clinical History: CLINICAL DATA:  Left shoulder and neck pain.  Left arm pain.   EXAM: MRI CERVICAL SPINE WITHOUT CONTRAST   TECHNIQUE: Multiplanar, multisequence MR imaging of the cervical spine was performed. No intravenous contrast was administered.   COMPARISON:  None Available.  FINDINGS: Alignment: Physiologic.   Vertebrae: No acute fracture, evidence of discitis, or aggressive bone lesion.   Cord: Normal signal and morphology.   Posterior Fossa, vertebral arteries, paraspinal tissues: Posterior fossa demonstrates no focal abnormality. Vertebral artery flow voids are maintained. Paraspinal soft tissues are unremarkable.    Disc levels:   Discs: Degenerative disease with mild disc height loss at C5-6 and C6-7.   C2-3: No disc protrusion. Moderate left facet arthropathy. Mild left foraminal stenosis. No right foraminal stenosis. No spinal stenosis.   C3-4: No disc protrusion. Severe left facet arthropathy. Moderate-severe left foraminal stenosis. No right foraminal stenosis. No spinal stenosis.   C4-5: Moderate left facet arthropathy. Mild left foraminal stenosis. No right foraminal stenosis. No spinal stenosis.   C5-6: Mild broad-based disc bulge. Mild bilateral facet arthropathy. Mild-moderate right foraminal stenosis. No left foraminal stenosis. No spinal stenosis.   C6-7: Mild broad-based disc bulge. Bilateral uncovertebral degenerative changes. Mild bilateral foraminal stenosis.   C7-T1: No significant disc bulge. No neural foraminal stenosis. No central canal stenosis.   IMPRESSION: 1. Diffuse cervical spine spondylosis as described above, more severe on the left. 2. No acute osseous injury of the cervical spine.     Electronically Signed   By: Julaine Blanch M.D.   On: 05/13/2022 10:25   She reports that she quit smoking about 32 years ago. Her smoking use included cigarettes. She started smoking about 48 years ago. She has a 8 pack-year smoking history. She has never used smokeless tobacco. No results for input(s): HGBA1C, LABURIC in the last 8760 hours.  Objective:  VS:  HT:    WT:   BMI:     BP:   HR: bpm  TEMP: ( )  RESP:  Physical Exam Vitals and nursing note reviewed.  HENT:     Head: Normocephalic and atraumatic.     Right Ear: External ear normal.     Left Ear: External ear normal.     Nose: Nose normal.     Mouth/Throat:     Mouth: Mucous membranes are moist.  Eyes:     Extraocular Movements: Extraocular movements intact.  Cardiovascular:     Rate and Rhythm: Normal rate.     Pulses: Normal pulses.  Pulmonary:     Effort: Pulmonary effort is normal.   Abdominal:     General: Abdomen is flat. There is no distension.  Musculoskeletal:        General: Tenderness present.     Cervical back: Tenderness present.     Comments: Discomfort noted with and side-to-side rotation, specifically left sided rotation. Patient has good strength in the upper extremities including 5 out of 5 strength in wrist extension, long finger flexion and APB. Shoulder range of motion is full bilaterally without any sign of impingement. There is no atrophy of the hands intrinsically. Sensation intact bilaterally. Myofascial tenderness noted to bilateral levator scapulae and trapezius regions upon palpation. Negative Hoffman's sign. Negative Spurling's sign.     Skin:    General: Skin is warm and dry.     Capillary Refill: Capillary refill takes less than 2 seconds.  Neurological:     General: No focal deficit present.     Mental Status: She is alert and oriented to person, place, and time.  Psychiatric:        Mood and Affect: Mood normal.        Behavior: Behavior normal.     Ortho Exam  Imaging: No results found.  Past Medical/Family/Surgical/Social  History: Medications & Allergies reviewed per EMR, new medications updated. Patient Active Problem List   Diagnosis Date Noted   Biceps tendonitis on left 07/28/2022   Shoulder arthritis 07/18/2022   Right upper quadrant pain 08/21/2017   Nausea without vomiting 08/21/2017   Hyperlipidemia 01/01/2017   Pre-diabetes 01/01/2017   Shortness of breath 01/01/2017   Palpitations 12/22/2016   Chest pain 12/19/2016   Hemorrhoids 12/19/2016   Past Medical History:  Diagnosis Date   Cancer (HCC)    Skin Cancer on Nose   Chronic diarrhea    due to IBS   Dyslipidemia    Dysrhythmia    Palpitations   GERD (gastroesophageal reflux disease)    Hiatal hernia    Hx   History of palpitations    History of skin cancer    nose   IBS (irritable bowel syndrome)    OA (osteoarthritis)    knees and lower back    OAB (overactive bladder)    Prolapsed internal hemorrhoids, grade 3    Wears contact lenses    Wears dentures    UPPER   Family History  Problem Relation Age of Onset   Alzheimer's disease Mother    Colon cancer Neg Hx    Stomach cancer Neg Hx    Rectal cancer Neg Hx    Esophageal cancer Neg Hx    Liver cancer Neg Hx    Past Surgical History:  Procedure Laterality Date   ABDOMINAL HYSTERECTOMY  1996    W/ BSO   BICEPT TENODESIS Left 07/18/2022   Procedure: BICEPS TENODESIS;  Surgeon: Addie Cordella Hamilton, MD;  Location: [facility] OR;  Service: Orthopedics;  Laterality: Left;   BREAST ENHANCEMENT SURGERY  1985   BUNIONECTOMY WITH HAMMERTOE RECONSTRUCTION Right 07-08-2014    @ [facility]   1st toe bunionectomy;  2nd toe hammertoe correction   CARDIOVASCULAR STRESS TEST  12-20-2016    [facility] , care everywhere in epic   norma nuclear perfusion study w/ no ischemia/  normal LV function and wall motion , ef 71%   CATARACT EXTRACTION, BILATERAL     COLONOSCOPY  07/2011   [facility]    COLONOSCOPY  2019   EYE SURGERY Bilateral    Laser surgery to remove film after cataracts removed   FOOT SURGERY     2 bone spurs removed   FOOT SURGERY Left 2022   arthroscopy for arthritis   FOREIGN BODY REMOVAL Right 02/27/2016   Procedure: REMOVAL FOREIGN BODY EXTREMITY;  Surgeon: Hamilton Cordella Addie, MD;  Location: [facility] OR;  Service: Orthopedics;  Laterality: Right;   HEMORRHOID SURGERY N/A 09/05/2017   Procedure: TWO COLUMN HEMORRHOIDECTOMY, HEMROIDPEXY;  Surgeon: Debby Hila, MD;  Location[facility];  Service: General;  Laterality: N/A;   KNEE ARTHROPLASTY  01/15/2011   Procedure: COMPUTER ASSISTED TOTAL KNEE ARTHROPLASTY;  Surgeon: Cordella Hamilton Addie;  Location: [facility] OR;  Service: Orthopedics;  Laterality: Right;  Right total knee arthroplasty   KNEE ARTHROSCOPY Left 12/2009   MOHS SURGERY     NASAL SINUS SURGERY  2017   REVERSE SHOULDER ARTHROPLASTY Left  07/18/2022   Procedure: REVERSE SHOULDER ARTHROPLASTY;  Surgeon: Addie Cordella Hamilton, MD;  Location: [facility] OR;  Service: Orthopedics;  Laterality: Left;  RNFA APRIL PLEASE   TONSILLECTOMY  age 77   TOTAL KNEE ARTHROPLASTY Left 11-27-2010    dr dean   [facility]   Social History   Occupational History   Occupation: court reporter-semi retired  Tobacco  Use   Smoking status: Former    Current packs/day: 0.00    Average packs/day: 0.5 packs/day for 16.0 years (8.0 ttl pk-yrs)    Types: Cigarettes    Start date: 01/11/1975    Quit date: 01/11/1991    Years since quitting: 32.6   Smokeless tobacco: Never  Vaping Use   Vaping status: Never Used  Substance and Sexual Activity   Alcohol use: No   Drug use: No   Sexual activity: Yes    Birth control/protection: Surgical    Comment: Complete hysterectomy

## 2023-11-03 ENCOUNTER — Other Ambulatory Visit: Payer: Self-pay | Admitting: Gastroenterology
